# Patient Record
Sex: Male | Born: 1965
Health system: Southern US, Community
[De-identification: ages and names within clinical notes are randomized; demographics above are authoritative.]

## PROBLEM LIST (undated history)

## (undated) DIAGNOSIS — R161 Splenomegaly, not elsewhere classified: Secondary | ICD-10-CM

## (undated) DIAGNOSIS — K7460 Unspecified cirrhosis of liver: Secondary | ICD-10-CM

## (undated) DIAGNOSIS — R748 Abnormal levels of other serum enzymes: Secondary | ICD-10-CM

## (undated) DIAGNOSIS — D696 Thrombocytopenia, unspecified: Secondary | ICD-10-CM

## (undated) DIAGNOSIS — L219 Seborrheic dermatitis, unspecified: Secondary | ICD-10-CM

## (undated) DIAGNOSIS — B20 Human immunodeficiency virus [HIV] disease: Secondary | ICD-10-CM

## (undated) DIAGNOSIS — A630 Anogenital (venereal) warts: Secondary | ICD-10-CM

## (undated) DIAGNOSIS — Z21 Asymptomatic human immunodeficiency virus [HIV] infection status: Secondary | ICD-10-CM

## (undated) DIAGNOSIS — N529 Male erectile dysfunction, unspecified: Secondary | ICD-10-CM

## (undated) DIAGNOSIS — K219 Gastro-esophageal reflux disease without esophagitis: Secondary | ICD-10-CM

## (undated) DIAGNOSIS — B079 Viral wart, unspecified: Secondary | ICD-10-CM

---

## 1995-04-27 ENCOUNTER — Encounter (INDEPENDENT_AMBULATORY_CARE_PROVIDER_SITE_OTHER): Payer: Self-pay | Admitting: *Deleted

## 1995-04-27 LAB — CONVERTED CEMR LAB: CD4 T Cell Abs: 220

## 1997-07-12 ENCOUNTER — Other Ambulatory Visit: Admission: RE | Admit: 1997-07-12 | Discharge: 1997-07-12 | Payer: Self-pay | Admitting: Family Medicine

## 1997-08-23 ENCOUNTER — Ambulatory Visit (HOSPITAL_COMMUNITY): Admission: RE | Admit: 1997-08-23 | Discharge: 1997-08-23 | Payer: Self-pay | Admitting: Family Medicine

## 1997-10-10 ENCOUNTER — Encounter: Payer: Self-pay | Admitting: Internal Medicine

## 1997-11-26 ENCOUNTER — Encounter: Payer: Self-pay | Admitting: Internal Medicine

## 1998-02-04 ENCOUNTER — Encounter: Payer: Self-pay | Admitting: Internal Medicine

## 1998-02-11 ENCOUNTER — Encounter: Payer: Self-pay | Admitting: Internal Medicine

## 1998-02-11 ENCOUNTER — Encounter: Admission: RE | Admit: 1998-02-11 | Discharge: 1998-02-11 | Payer: Self-pay | Admitting: Internal Medicine

## 1998-03-12 ENCOUNTER — Encounter: Admission: RE | Admit: 1998-03-12 | Discharge: 1998-03-12 | Payer: Self-pay | Admitting: Internal Medicine

## 1998-04-10 ENCOUNTER — Ambulatory Visit (HOSPITAL_COMMUNITY): Admission: RE | Admit: 1998-04-10 | Discharge: 1998-04-10 | Payer: Self-pay | Admitting: Internal Medicine

## 1998-04-23 ENCOUNTER — Encounter: Admission: RE | Admit: 1998-04-23 | Discharge: 1998-04-23 | Payer: Self-pay | Admitting: Internal Medicine

## 1998-06-18 ENCOUNTER — Ambulatory Visit (HOSPITAL_COMMUNITY): Admission: RE | Admit: 1998-06-18 | Discharge: 1998-06-18 | Payer: Self-pay | Admitting: Infectious Diseases

## 1998-07-02 ENCOUNTER — Encounter: Admission: RE | Admit: 1998-07-02 | Discharge: 1998-07-02 | Payer: Self-pay | Admitting: Internal Medicine

## 1998-08-13 ENCOUNTER — Encounter: Admission: RE | Admit: 1998-08-13 | Discharge: 1998-08-13 | Payer: Self-pay | Admitting: Internal Medicine

## 1998-08-13 ENCOUNTER — Ambulatory Visit (HOSPITAL_COMMUNITY): Admission: RE | Admit: 1998-08-13 | Discharge: 1998-08-13 | Payer: Self-pay | Admitting: Internal Medicine

## 1998-09-03 ENCOUNTER — Encounter: Admission: RE | Admit: 1998-09-03 | Discharge: 1998-09-03 | Payer: Self-pay | Admitting: Internal Medicine

## 1998-09-03 ENCOUNTER — Encounter: Payer: Self-pay | Admitting: Internal Medicine

## 1998-09-23 ENCOUNTER — Encounter: Admission: RE | Admit: 1998-09-23 | Discharge: 1998-09-23 | Payer: Self-pay | Admitting: Internal Medicine

## 1998-10-29 ENCOUNTER — Ambulatory Visit (HOSPITAL_COMMUNITY): Admission: RE | Admit: 1998-10-29 | Discharge: 1998-10-29 | Payer: Self-pay | Admitting: Internal Medicine

## 1998-11-26 ENCOUNTER — Encounter: Admission: RE | Admit: 1998-11-26 | Discharge: 1998-11-26 | Payer: Self-pay | Admitting: Internal Medicine

## 1999-02-04 ENCOUNTER — Encounter: Admission: RE | Admit: 1999-02-04 | Discharge: 1999-02-04 | Payer: Self-pay | Admitting: Internal Medicine

## 1999-02-04 ENCOUNTER — Ambulatory Visit (HOSPITAL_COMMUNITY): Admission: RE | Admit: 1999-02-04 | Discharge: 1999-02-04 | Payer: Self-pay | Admitting: Internal Medicine

## 1999-03-10 ENCOUNTER — Encounter: Admission: RE | Admit: 1999-03-10 | Discharge: 1999-03-10 | Payer: Self-pay | Admitting: Internal Medicine

## 1999-04-30 ENCOUNTER — Encounter: Admission: RE | Admit: 1999-04-30 | Discharge: 1999-04-30 | Payer: Self-pay | Admitting: Internal Medicine

## 1999-04-30 ENCOUNTER — Ambulatory Visit (HOSPITAL_COMMUNITY): Admission: RE | Admit: 1999-04-30 | Discharge: 1999-04-30 | Payer: Self-pay | Admitting: Internal Medicine

## 1999-06-03 ENCOUNTER — Encounter: Admission: RE | Admit: 1999-06-03 | Discharge: 1999-06-03 | Payer: Self-pay | Admitting: Internal Medicine

## 1999-07-15 ENCOUNTER — Ambulatory Visit (HOSPITAL_COMMUNITY): Admission: RE | Admit: 1999-07-15 | Discharge: 1999-07-15 | Payer: Self-pay | Admitting: Internal Medicine

## 1999-07-15 ENCOUNTER — Encounter: Admission: RE | Admit: 1999-07-15 | Discharge: 1999-07-15 | Payer: Self-pay | Admitting: Internal Medicine

## 1999-07-29 ENCOUNTER — Encounter: Admission: RE | Admit: 1999-07-29 | Discharge: 1999-07-29 | Payer: Self-pay | Admitting: Internal Medicine

## 1999-10-07 ENCOUNTER — Encounter: Admission: RE | Admit: 1999-10-07 | Discharge: 1999-10-07 | Payer: Self-pay | Admitting: Internal Medicine

## 1999-10-07 ENCOUNTER — Ambulatory Visit (HOSPITAL_COMMUNITY): Admission: RE | Admit: 1999-10-07 | Discharge: 1999-10-07 | Payer: Self-pay | Admitting: Internal Medicine

## 1999-11-04 ENCOUNTER — Encounter: Admission: RE | Admit: 1999-11-04 | Discharge: 1999-11-04 | Payer: Self-pay | Admitting: Internal Medicine

## 2000-02-10 ENCOUNTER — Encounter: Admission: RE | Admit: 2000-02-10 | Discharge: 2000-02-10 | Payer: Self-pay | Admitting: Internal Medicine

## 2000-02-10 ENCOUNTER — Ambulatory Visit (HOSPITAL_COMMUNITY): Admission: RE | Admit: 2000-02-10 | Discharge: 2000-02-10 | Payer: Self-pay | Admitting: Internal Medicine

## 2000-02-24 ENCOUNTER — Encounter: Admission: RE | Admit: 2000-02-24 | Discharge: 2000-02-24 | Payer: Self-pay | Admitting: Internal Medicine

## 2000-04-01 ENCOUNTER — Encounter: Admission: RE | Admit: 2000-04-01 | Discharge: 2000-04-01 | Payer: Self-pay | Admitting: Internal Medicine

## 2000-07-23 ENCOUNTER — Encounter: Admission: RE | Admit: 2000-07-23 | Discharge: 2000-07-23 | Payer: Self-pay | Admitting: Internal Medicine

## 2000-07-23 ENCOUNTER — Ambulatory Visit (HOSPITAL_COMMUNITY): Admission: RE | Admit: 2000-07-23 | Discharge: 2000-07-23 | Payer: Self-pay | Admitting: Internal Medicine

## 2000-08-03 ENCOUNTER — Encounter: Admission: RE | Admit: 2000-08-03 | Discharge: 2000-08-03 | Payer: Self-pay | Admitting: Internal Medicine

## 2000-10-08 ENCOUNTER — Encounter: Admission: RE | Admit: 2000-10-08 | Discharge: 2000-10-08 | Payer: Self-pay | Admitting: Internal Medicine

## 2000-10-08 ENCOUNTER — Ambulatory Visit (HOSPITAL_COMMUNITY): Admission: RE | Admit: 2000-10-08 | Discharge: 2000-10-08 | Payer: Self-pay | Admitting: Internal Medicine

## 2000-11-23 ENCOUNTER — Encounter: Payer: Self-pay | Admitting: Internal Medicine

## 2000-11-23 ENCOUNTER — Encounter: Admission: RE | Admit: 2000-11-23 | Discharge: 2000-11-23 | Payer: Self-pay | Admitting: Internal Medicine

## 2001-03-01 ENCOUNTER — Encounter: Admission: RE | Admit: 2001-03-01 | Discharge: 2001-03-01 | Payer: Self-pay | Admitting: Internal Medicine

## 2001-04-20 ENCOUNTER — Ambulatory Visit (HOSPITAL_COMMUNITY): Admission: RE | Admit: 2001-04-20 | Discharge: 2001-04-20 | Payer: Self-pay | Admitting: Internal Medicine

## 2001-04-20 ENCOUNTER — Encounter: Admission: RE | Admit: 2001-04-20 | Discharge: 2001-04-20 | Payer: Self-pay | Admitting: Internal Medicine

## 2001-05-24 ENCOUNTER — Encounter: Admission: RE | Admit: 2001-05-24 | Discharge: 2001-05-24 | Payer: Self-pay | Admitting: Internal Medicine

## 2001-08-09 ENCOUNTER — Encounter: Admission: RE | Admit: 2001-08-09 | Discharge: 2001-08-09 | Payer: Self-pay | Admitting: Internal Medicine

## 2001-08-09 ENCOUNTER — Ambulatory Visit (HOSPITAL_COMMUNITY): Admission: RE | Admit: 2001-08-09 | Discharge: 2001-08-09 | Payer: Self-pay | Admitting: Internal Medicine

## 2001-08-23 ENCOUNTER — Encounter: Admission: RE | Admit: 2001-08-23 | Discharge: 2001-08-23 | Payer: Self-pay | Admitting: Internal Medicine

## 2001-11-03 ENCOUNTER — Ambulatory Visit (HOSPITAL_COMMUNITY): Admission: RE | Admit: 2001-11-03 | Discharge: 2001-11-03 | Payer: Self-pay | Admitting: Internal Medicine

## 2001-11-03 ENCOUNTER — Encounter: Admission: RE | Admit: 2001-11-03 | Discharge: 2001-11-03 | Payer: Self-pay | Admitting: Internal Medicine

## 2002-01-17 ENCOUNTER — Encounter: Admission: RE | Admit: 2002-01-17 | Discharge: 2002-01-17 | Payer: Self-pay | Admitting: Internal Medicine

## 2002-05-17 ENCOUNTER — Encounter: Payer: Self-pay | Admitting: Internal Medicine

## 2002-05-17 ENCOUNTER — Encounter: Admission: RE | Admit: 2002-05-17 | Discharge: 2002-05-17 | Payer: Self-pay | Admitting: Internal Medicine

## 2003-02-13 ENCOUNTER — Encounter: Payer: Self-pay | Admitting: Internal Medicine

## 2003-02-13 ENCOUNTER — Encounter: Admission: RE | Admit: 2003-02-13 | Discharge: 2003-02-13 | Payer: Self-pay | Admitting: Internal Medicine

## 2004-04-01 ENCOUNTER — Ambulatory Visit: Payer: Self-pay | Admitting: Internal Medicine

## 2004-04-01 ENCOUNTER — Ambulatory Visit (HOSPITAL_COMMUNITY): Admission: RE | Admit: 2004-04-01 | Discharge: 2004-04-01 | Payer: Self-pay | Admitting: Internal Medicine

## 2004-04-15 ENCOUNTER — Ambulatory Visit: Payer: Self-pay | Admitting: Internal Medicine

## 2004-05-28 ENCOUNTER — Ambulatory Visit: Payer: Self-pay | Admitting: Internal Medicine

## 2004-05-28 ENCOUNTER — Ambulatory Visit (HOSPITAL_COMMUNITY): Admission: RE | Admit: 2004-05-28 | Discharge: 2004-05-28 | Payer: Self-pay | Admitting: Internal Medicine

## 2004-10-14 ENCOUNTER — Ambulatory Visit (HOSPITAL_COMMUNITY): Admission: RE | Admit: 2004-10-14 | Discharge: 2004-10-14 | Payer: Self-pay | Admitting: Internal Medicine

## 2004-10-14 ENCOUNTER — Ambulatory Visit: Payer: Self-pay | Admitting: Internal Medicine

## 2004-12-29 ENCOUNTER — Ambulatory Visit (HOSPITAL_COMMUNITY): Admission: RE | Admit: 2004-12-29 | Discharge: 2004-12-29 | Payer: Self-pay | Admitting: Surgery

## 2005-01-16 ENCOUNTER — Ambulatory Visit (HOSPITAL_COMMUNITY): Admission: RE | Admit: 2005-01-16 | Discharge: 2005-01-16 | Payer: Self-pay | Admitting: Surgery

## 2005-01-26 HISTORY — PX: CONDYLOMA EXCISION/FULGURATION: SHX1389

## 2005-02-12 ENCOUNTER — Ambulatory Visit: Payer: Self-pay | Admitting: Internal Medicine

## 2005-03-03 ENCOUNTER — Ambulatory Visit: Payer: Self-pay | Admitting: Internal Medicine

## 2005-04-16 ENCOUNTER — Encounter: Admission: RE | Admit: 2005-04-16 | Discharge: 2005-04-16 | Payer: Self-pay | Admitting: Internal Medicine

## 2005-04-16 ENCOUNTER — Ambulatory Visit: Payer: Self-pay | Admitting: Internal Medicine

## 2005-05-05 ENCOUNTER — Ambulatory Visit: Payer: Self-pay | Admitting: Internal Medicine

## 2005-07-20 ENCOUNTER — Ambulatory Visit (HOSPITAL_BASED_OUTPATIENT_CLINIC_OR_DEPARTMENT_OTHER): Admission: RE | Admit: 2005-07-20 | Discharge: 2005-07-20 | Payer: Self-pay | Admitting: Urology

## 2005-08-18 ENCOUNTER — Ambulatory Visit: Payer: Self-pay | Admitting: Internal Medicine

## 2005-08-18 ENCOUNTER — Encounter: Admission: RE | Admit: 2005-08-18 | Discharge: 2005-08-18 | Payer: Self-pay | Admitting: Internal Medicine

## 2005-08-18 ENCOUNTER — Encounter (INDEPENDENT_AMBULATORY_CARE_PROVIDER_SITE_OTHER): Payer: Self-pay | Admitting: *Deleted

## 2005-09-01 ENCOUNTER — Ambulatory Visit: Payer: Self-pay | Admitting: Internal Medicine

## 2005-12-08 ENCOUNTER — Encounter: Admission: RE | Admit: 2005-12-08 | Discharge: 2005-12-08 | Payer: Self-pay | Admitting: Internal Medicine

## 2005-12-08 ENCOUNTER — Encounter (INDEPENDENT_AMBULATORY_CARE_PROVIDER_SITE_OTHER): Payer: Self-pay | Admitting: *Deleted

## 2005-12-08 ENCOUNTER — Ambulatory Visit: Payer: Self-pay | Admitting: Internal Medicine

## 2006-02-06 DIAGNOSIS — D696 Thrombocytopenia, unspecified: Secondary | ICD-10-CM

## 2006-02-06 DIAGNOSIS — B078 Other viral warts: Secondary | ICD-10-CM

## 2006-02-06 DIAGNOSIS — F528 Other sexual dysfunction not due to a substance or known physiological condition: Secondary | ICD-10-CM | POA: Insufficient documentation

## 2006-02-06 DIAGNOSIS — B20 Human immunodeficiency virus [HIV] disease: Secondary | ICD-10-CM | POA: Insufficient documentation

## 2006-02-06 DIAGNOSIS — K219 Gastro-esophageal reflux disease without esophagitis: Secondary | ICD-10-CM | POA: Insufficient documentation

## 2006-02-22 ENCOUNTER — Encounter (INDEPENDENT_AMBULATORY_CARE_PROVIDER_SITE_OTHER): Payer: Self-pay | Admitting: *Deleted

## 2006-02-22 ENCOUNTER — Encounter: Admission: RE | Admit: 2006-02-22 | Discharge: 2006-02-22 | Payer: Self-pay | Admitting: Internal Medicine

## 2006-02-22 ENCOUNTER — Ambulatory Visit: Payer: Self-pay | Admitting: Internal Medicine

## 2006-02-22 LAB — CONVERTED CEMR LAB
CD4 Count: 160 microliters
HIV 1 RNA Quant: 49 copies/mL

## 2006-03-22 ENCOUNTER — Encounter (INDEPENDENT_AMBULATORY_CARE_PROVIDER_SITE_OTHER): Payer: Self-pay | Admitting: *Deleted

## 2006-03-22 LAB — CONVERTED CEMR LAB

## 2006-03-23 ENCOUNTER — Telehealth (INDEPENDENT_AMBULATORY_CARE_PROVIDER_SITE_OTHER): Payer: Self-pay | Admitting: *Deleted

## 2006-04-04 ENCOUNTER — Encounter (INDEPENDENT_AMBULATORY_CARE_PROVIDER_SITE_OTHER): Payer: Self-pay | Admitting: *Deleted

## 2006-04-06 ENCOUNTER — Encounter (INDEPENDENT_AMBULATORY_CARE_PROVIDER_SITE_OTHER): Payer: Self-pay | Admitting: *Deleted

## 2006-04-27 ENCOUNTER — Telehealth: Payer: Self-pay | Admitting: Internal Medicine

## 2006-05-24 ENCOUNTER — Telehealth: Payer: Self-pay | Admitting: Internal Medicine

## 2006-07-14 ENCOUNTER — Encounter: Payer: Self-pay | Admitting: Infectious Diseases

## 2006-07-14 ENCOUNTER — Ambulatory Visit: Payer: Self-pay | Admitting: Infectious Diseases

## 2006-07-14 ENCOUNTER — Encounter: Admission: RE | Admit: 2006-07-14 | Discharge: 2006-07-14 | Payer: Self-pay | Admitting: Infectious Diseases

## 2006-07-14 LAB — CONVERTED CEMR LAB: CD4 Count: 190 microliters

## 2006-08-02 ENCOUNTER — Ambulatory Visit: Payer: Self-pay | Admitting: Infectious Diseases

## 2006-09-10 ENCOUNTER — Telehealth: Payer: Self-pay | Admitting: Internal Medicine

## 2006-10-18 ENCOUNTER — Telehealth: Payer: Self-pay | Admitting: Internal Medicine

## 2006-12-16 ENCOUNTER — Telehealth: Payer: Self-pay | Admitting: Internal Medicine

## 2007-01-25 ENCOUNTER — Encounter (INDEPENDENT_AMBULATORY_CARE_PROVIDER_SITE_OTHER): Payer: Self-pay | Admitting: *Deleted

## 2007-03-14 ENCOUNTER — Encounter: Payer: Self-pay | Admitting: Internal Medicine

## 2007-03-17 ENCOUNTER — Telehealth: Payer: Self-pay | Admitting: Infectious Diseases

## 2007-03-25 ENCOUNTER — Encounter: Admission: RE | Admit: 2007-03-25 | Discharge: 2007-03-25 | Payer: Self-pay | Admitting: Internal Medicine

## 2007-03-25 ENCOUNTER — Ambulatory Visit: Payer: Self-pay | Admitting: Internal Medicine

## 2007-03-25 LAB — CONVERTED CEMR LAB
ALT: 25 units/L (ref 0–53)
Albumin: 3.9 g/dL (ref 3.5–5.2)
Alkaline Phosphatase: 65 units/L (ref 39–117)
CO2: 19 meq/L (ref 19–32)
Glucose, Bld: 85 mg/dL (ref 70–99)
HIV 1 RNA Quant: <50 copies/mL (ref <50)
HIV-1 RNA Quant, Log: <1.7 (ref <1.70)
MCV: 81.8 fL (ref 78.0–100.0)
Potassium: 4 meq/L (ref 3.5–5.3)
RBC: 4.13 M/uL — ABNORMAL LOW (ref 4.22–5.81)
Sodium: 139 meq/L (ref 135–145)
Total Bilirubin: 1.4 mg/dL — ABNORMAL HIGH (ref 0.3–1.2)
Total Protein: 6.8 g/dL (ref 6.0–8.3)
WBC: 2.3 10*3/uL — ABNORMAL LOW (ref 4.0–10.5)

## 2007-04-04 ENCOUNTER — Encounter (INDEPENDENT_AMBULATORY_CARE_PROVIDER_SITE_OTHER): Payer: Self-pay | Admitting: *Deleted

## 2007-04-20 ENCOUNTER — Ambulatory Visit: Payer: Self-pay | Admitting: Internal Medicine

## 2007-05-10 ENCOUNTER — Encounter (INDEPENDENT_AMBULATORY_CARE_PROVIDER_SITE_OTHER): Payer: Self-pay | Admitting: *Deleted

## 2007-10-04 ENCOUNTER — Ambulatory Visit: Payer: Self-pay | Admitting: Internal Medicine

## 2007-10-04 LAB — CONVERTED CEMR LAB
HDL: 53 mg/dL (ref >39)
HIV 1 RNA Quant: 187 copies/mL — ABNORMAL HIGH (ref <50)
LDL Cholesterol: 75 mg/dL (ref 0–99)
Total CHOL/HDL Ratio: 2.8

## 2007-10-18 ENCOUNTER — Ambulatory Visit: Payer: Self-pay | Admitting: Internal Medicine

## 2008-04-02 ENCOUNTER — Ambulatory Visit: Payer: Self-pay | Admitting: Internal Medicine

## 2008-04-02 LAB — CONVERTED CEMR LAB
AST: 43 units/L — ABNORMAL HIGH (ref 0–37)
Alkaline Phosphatase: 47 units/L (ref 39–117)
BUN: 10 mg/dL (ref 6–23)
Band Neutrophils: 0 % (ref 0–10)
Basophils Absolute: 0 10*3/uL (ref 0.0–0.1)
Basophils Relative: 0 % (ref 0–1)
Calcium: 8.3 mg/dL — ABNORMAL LOW (ref 8.4–10.5)
Creatinine, Ser: 0.84 mg/dL (ref 0.40–1.50)
Eosinophils Absolute: 0 10*3/uL (ref 0.0–0.7)
Eosinophils Relative: 1 % (ref 0–5)
HDL: 54 mg/dL (ref >39)
LDL Cholesterol: 64 mg/dL (ref 0–99)
MCHC: 35.8 g/dL (ref 30.0–36.0)
MCV: 85.9 fL (ref 78.0–100.0)
Neutrophils Relative %: 71 % (ref 43–77)
Platelets: 27 10*3/uL — CL (ref 150–400)
Total Bilirubin: 1.2 mg/dL (ref 0.3–1.2)
Total CHOL/HDL Ratio: 2.5
VLDL: 15 mg/dL (ref 0–40)

## 2008-04-17 ENCOUNTER — Ambulatory Visit: Payer: Self-pay | Admitting: Internal Medicine

## 2008-04-17 DIAGNOSIS — L219 Seborrheic dermatitis, unspecified: Secondary | ICD-10-CM | POA: Insufficient documentation

## 2008-05-23 ENCOUNTER — Encounter (INDEPENDENT_AMBULATORY_CARE_PROVIDER_SITE_OTHER): Payer: Self-pay | Admitting: *Deleted

## 2008-09-12 ENCOUNTER — Ambulatory Visit: Payer: Self-pay | Admitting: Internal Medicine

## 2008-09-12 LAB — CONVERTED CEMR LAB
Basophils Relative: 0 % (ref 0–1)
Eosinophils Absolute: 0.1 10*3/uL (ref 0.0–0.7)
MCHC: 34.2 g/dL (ref 30.0–36.0)
MCV: 88.4 fL (ref >=78.0)
Monocytes Absolute: 0.3 10*3/uL (ref 0.1–1.0)
Monocytes Relative: 10 % (ref 3–12)
Neutrophils Relative %: 61 % (ref 43–77)
RBC: 4.14 M/uL — ABNORMAL LOW (ref 4.22–5.81)
RDW: 16.9 % — ABNORMAL HIGH (ref 11.5–15.5)

## 2008-09-13 ENCOUNTER — Telehealth (INDEPENDENT_AMBULATORY_CARE_PROVIDER_SITE_OTHER): Payer: Self-pay | Admitting: Internal Medicine

## 2008-09-25 ENCOUNTER — Ambulatory Visit: Payer: Self-pay | Admitting: Internal Medicine

## 2009-04-02 ENCOUNTER — Encounter (INDEPENDENT_AMBULATORY_CARE_PROVIDER_SITE_OTHER): Payer: Self-pay | Admitting: *Deleted

## 2009-04-03 ENCOUNTER — Ambulatory Visit: Payer: Self-pay | Admitting: Internal Medicine

## 2009-04-03 LAB — CONVERTED CEMR LAB
Basophils Relative: 0 % (ref 0–1)
CO2: 21 meq/L (ref 19–32)
Cholesterol: 146 mg/dL (ref 0–200)
Creatinine, Ser: 1.03 mg/dL (ref 0.40–1.50)
Eosinophils Absolute: 0.1 10*3/uL (ref 0.0–0.7)
Glucose, Bld: 92 mg/dL (ref 70–99)
HIV-1 RNA Quant, Log: 2.04 — ABNORMAL HIGH (ref <1.68)
Hemoglobin: 13.2 g/dL (ref 13.0–17.0)
MCHC: 35.9 g/dL (ref 30.0–36.0)
MCV: 88.2 fL (ref >=78.0)
Monocytes Absolute: 0.2 10*3/uL (ref 0.1–1.0)
Monocytes Relative: 9 % (ref 3–12)
RBC: 4.17 M/uL — ABNORMAL LOW (ref 4.22–5.81)
Total Bilirubin: 1.6 mg/dL — ABNORMAL HIGH (ref 0.3–1.2)
Total CHOL/HDL Ratio: 2.9
Triglycerides: 67 mg/dL (ref <150)
VLDL: 13 mg/dL (ref 0–40)

## 2009-04-04 ENCOUNTER — Telehealth: Payer: Self-pay | Admitting: Internal Medicine

## 2009-04-30 ENCOUNTER — Ambulatory Visit: Payer: Self-pay | Admitting: Internal Medicine

## 2009-10-08 ENCOUNTER — Encounter: Payer: Self-pay | Admitting: Internal Medicine

## 2009-10-08 ENCOUNTER — Telehealth (INDEPENDENT_AMBULATORY_CARE_PROVIDER_SITE_OTHER): Payer: Self-pay | Admitting: *Deleted

## 2009-10-29 ENCOUNTER — Ambulatory Visit: Payer: Self-pay | Admitting: Internal Medicine

## 2009-11-21 ENCOUNTER — Ambulatory Visit: Payer: Self-pay | Admitting: Internal Medicine

## 2009-12-17 ENCOUNTER — Encounter (INDEPENDENT_AMBULATORY_CARE_PROVIDER_SITE_OTHER): Payer: Self-pay | Admitting: *Deleted

## 2010-02-23 LAB — CONVERTED CEMR LAB
Albumin: 4.1 g/dL (ref 3.5–5.2)
Alkaline Phosphatase: 71 units/L (ref 39–117)
BUN: 7 mg/dL (ref 6–23)
CO2: 22 meq/L (ref 19–32)
Calcium: 8.7 mg/dL (ref 8.4–10.5)
Cholesterol: 150 mg/dL (ref 0–200)
Glucose, Bld: 102 mg/dL — ABNORMAL HIGH (ref 70–99)
HDL: 37 mg/dL — ABNORMAL LOW (ref >39)
HIV 1 RNA Quant: <50 copies/mL (ref <50)
HIV 1 RNA Quant: <50 copies/mL (ref <50)
HIV-1 RNA Quant, Log: <1.7 (ref <1.70)
HIV-1 RNA Quant, Log: <1.7 (ref <1.70)
Hemoglobin: 12.3 g/dL — ABNORMAL LOW (ref 13.0–17.0)
LDL Cholesterol: 89 mg/dL (ref 0–99)
MCHC: 35.7 g/dL (ref 30.0–36.0)
Potassium: 3.4 meq/L — ABNORMAL LOW (ref 3.5–5.3)
RBC: 4.01 M/uL — ABNORMAL LOW (ref 4.22–5.81)
Total Protein: 6.9 g/dL (ref 6.0–8.3)
Triglycerides: 120 mg/dL (ref <150)
WBC: 2.6 10*3/uL — ABNORMAL LOW (ref 4.0–10.5)

## 2010-02-25 NOTE — Assessment & Plan Note (Signed)
Summary: F/U APPT/VS   CC:  follow-up visit.  History of Present Illness: Robert Ware is in for his routine visit.  He recalls missing only two doses of his HIV medications since his last visit and both of those occurred when his pharmacy was late to refilling his prescriptions.  He is doing well and denies any recent problems.  Preventive Screening-Counseling & Management  Alcohol-Tobacco     Alcohol drinks/day: 0     Alcohol type: all     Smoking Status: quit     Year Quit: 2007     Pack years: 1/5 ppd  Caffeine-Diet-Exercise     Caffeine use/day: yes     Does Patient Exercise: yes     Type of exercise: work out     Exercise (avg: min/session): >60     Times/week: 3  Hep-HIV-STD-Contraception     HIV Risk: no risk noted     HIV Risk Counseling: to avoid increased HIV risk  Safety-Violence-Falls     Seat Belt Use: yes  Comments: declined condoms      Sexual History:  currently monogamous.        Drug Use:  never.     Prior Medication List:  TRIZIVIR 300-150-300 MG TABS (ABACAVIR-LAMIVUDINE-ZIDOVUDINE) Take 1 tablet by mouth two times a day VIREAD 300 MG TABS (TENOFOVIR DISOPROXIL FUMARATE) Take 1 tablet by mouth once a day PREZISTA 600 MG  TABS (DARUNAVIR) Take 1 tablet by mouth two times a day NORVIR 100 MG CAPS (RITONAVIR) Take 1 capsule by mouth two times a day DAPSONE 100 MG TABS (DAPSONE) Take 1 tablet by mouth once a day PRILOSEC 40 MG  CPDR (OMEPRAZOLE) Take 1 tablet by mouth once a day KETOCONAZOLE 2 % CREA (KETOCONAZOLE) Apply to rash two times a day   Current Allergies (reviewed today): No known allergies  Social History: Sexual History:  currently monogamous  Vital Signs:  Patient profile:   45 year old male Height:      77 inches (195.58 cm) Weight:      217 pounds (98.64 kg) BMI:     25.83 Temp:     98.8 degrees F (37.11 degrees C) oral Pulse rate:   83 / minute BP sitting:   131 / 85  (left arm) Cuff size:   large  Vitals Entered By: Jennet Maduro RN (April 30, 2009 3:07 PM) CC: follow-up visit Is Patient Diabetic? No Pain Assessment Patient in pain? no      Nutritional Status BMI of 25 - 29 = overweight Nutritional Status Detail appetite "OK"  Have you ever been in a relationship where you felt threatened, hurt or afraid?No   Does patient need assistance? Functional Status Self care Ambulation Normal Comments no missed doses of rxes   Physical Exam  General:  alert and well-nourished.   Mouth:  pharynx pink and moist.  good dentition.   Lungs:  normal breath sounds.  no crackles and no wheezes.   Heart:  normal rate, regular rhythm, and no murmur.   Psych:  normally interactive, good eye contact, not anxious appearing, and not depressed appearing.      Impression & Recommendations:  Problem # 1:  HIV DISEASE (ICD-042) His last two CD4 counts have been over 200 but since he has frequently gone above 200 then back down below I will continue his PCP prophylaxis for now. 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 Patient Level III (10272) Future Orders: T-CD4SP ([facility]) (CD4SP) ... 10/27/2009 T-HIV Viral Load [phone]) ... 10/27/2009  Patient Instructions: 1)  Please schedule a follow-up appointment in 6 months.

## 2010-02-25 NOTE — Medication Information (Signed)
Summary: [facility]  [facility]   Imported By: Florinda Marker 10/14/2009 14:43:07  _____________________________________________________________________  External Attachment:    Type:   Image     Comment:   External Document

## 2010-02-25 NOTE — Miscellaneous (Signed)
Summary: clinical update/ryan white [facility] appr til 04/26/10  Clinical Lists Changes  Observations: Added new observation of AIDSDAP: Yes 2011 (04/02/2009 12:21)

## 2010-02-25 NOTE — Progress Notes (Signed)
Summary: [facility] uanble to reach pt/can't ship meds  Phone Note Outgoing Call   Call placed by: Paulo Fruit  BS,CPht II,MPH,  October 08, 2009 11:18 AM Call placed to: Patient Details for Reason: [facility] unable to reach pt to delivery meds Summary of Call: received an email from [facility] support [facility] stating that they have been unsuccessful in reaching patient to set up his refill.    I contacted patient but had to leave a message for patient to contact Byrd Hesselbach at [phone] concerning this matter. Initial call taken by: Paulo Fruit  BS,CPht II,MPH,  October 08, 2009 11:19 AM     Appended Document: [facility] uanble to reach pt/can't ship meds Patient returned Robert Ware's phone call in reference to [facility] not being able to reach him to set up his refill/delivery.  He said he will give them a call today.

## 2010-02-25 NOTE — Progress Notes (Signed)
  Phone Note Other Incoming   Summary of Call: Received page from [facility] about the critical lab value of platelet 26K. 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 Blair Dolphin note and current  platelent count seems to be his baseline recently and is stable. Will not make any changes to the current management. Will send a  flag to Dr. Orvan Falconer.

## 2010-02-25 NOTE — Assessment & Plan Note (Signed)
Summary: fukam   CC:  follow-up visit.  History of Present Illness: Robert Ware is in for his regular appointment.  It sounds like he had some difficulty in getting his medications recently. He says that he was out of the medicines twice but it is unclear to me for exactly how long.  He is not taking his Prilosec now because he is not having any acid indigestion.  He recently started working out several times each week at the gym and has been happy to lose some weight.  Preventive Screening-Counseling & Management  Alcohol-Tobacco     Alcohol drinks/day: 0     Alcohol type: all     Smoking Status: quit     Year Quit: 2007     Pack years: 1/5 ppd  Caffeine-Diet-Exercise     Caffeine use/day: yes     Does Patient Exercise: yes     Type of exercise: work out     Exercise (avg: min/session): >60     Times/week: 3  Hep-HIV-STD-Contraception     HIV Risk: no risk noted     HIV Risk Counseling: to avoid increased HIV risk  Safety-Violence-Falls     Seat Belt Use: yes  Comments: declined condoms      Sexual History:  n/a.        Drug Use:  never.     Updated Prior Medication List: TRIZIVIR 300-150-300 MG TABS (ABACAVIR-LAMIVUDINE-ZIDOVUDINE) Take 1 tablet by mouth two times a day VIREAD 300 MG TABS (TENOFOVIR DISOPROXIL FUMARATE) Take 1 tablet by mouth once a day PREZISTA 600 MG  TABS (DARUNAVIR) Take 1 tablet by mouth two times a day NORVIR 100 MG CAPS (RITONAVIR) Take 1 capsule by mouth two times a day DAPSONE 100 MG TABS (DAPSONE) Take 1 tablet by mouth once a day KETOCONAZOLE 2 % CREA (KETOCONAZOLE) Apply to rash two times a day  Current Allergies (reviewed today): No known allergies  Social History: Sexual History:  n/a  Vital Signs:  Patient profile:   45 year old male Height:      77 inches (195.58 cm) Weight:      206.75 pounds (93.98 kg) BMI:     24.61 Temp:     98.2 degrees F (36.78 degrees C) oral Pulse rate:   68 / minute BP sitting:   121 / 78  (left  arm) Cuff size:   large  Vitals Entered By: Jennet Maduro RN (November 21, 2009 10:57 AM) CC: follow-up visit Is Patient Diabetic? No Pain Assessment Patient in pain? no      Nutritional Status BMI of 25 - 29 = overweight Nutritional Status Detail appetite "so-so"  Have you ever been in a relationship where you felt threatened, hurt or afraid?No   Does patient need assistance? Functional Status Self care Ambulation Normal Comments "still taking meds."   Physical Exam  General:  alert and well-nourished.  he has lost 11 pounds. Mouth:  pharynx pink and moist.  good dentition.   Lungs:  normal breath sounds.  no crackles and no wheezes.   Heart:  normal rate, regular rhythm, and no murmur.          Medication Adherence: 11/21/2009   Adherence to medications reviewed with patient. Counseling to provide adequate adherence provided                                Impression & Recommendations:  Problem # 1:  HIV DISEASE (  ICD-042) Fortunately, his HIV infection remains under very good control.  He can stop his dapsone now.  I will continue his current antiretroviral regimen.  I have encouraged him to try not to miss a single dose. 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 The following medications were removed from the medication list:    Prilosec 40 Mg Cpdr (Omeprazole) .Marland Kitchen... Take 1 tablet by mouth once a day  Orders: Est. Patient Level III (62952)  Other Orders: Future Orders: T-CD4SP ([facility]) (CD4SP) ... 05/20/2010 T-HIV Viral Load [phone]) ... 05/20/2010 T-Comprehensive Metabolic Panel [id number]) ... 05/20/2010 T-CBC  w/Diff ([id number]) ... 05/20/2010 T-RPR (Syphilis) [phone]) ... 05/20/2010 T-Lipid Profile [id number]) ... 05/20/2010  Patient Instructions: 1)  Please schedule a follow-up appointment in 6 months.  Appended Document: f/u   Influenza Vaccine    Vaccine Type: Fluvax Non-MCR    Site: left deltoid    Mfr: novartis    Dose: 0.5 ml    Route: IM    Given by: Jennet Maduro RN    Exp. Date: 04/27/2010    Lot #: [id number]  Flu Vaccine Consent Questions    Do you have a history of severe allergic reactions to this vaccine? no    Any prior history of allergic reactions to egg and/or gelatin? no    Do you have a sensitivity to the preservative Thimersol? no    Do you have a past history of Guillan-Barre Syndrome? no    Do you currently have an acute febrile illness? no    Have you ever had a severe reaction to latex? no    Vaccine information given and explained to patient? yes

## 2010-02-25 NOTE — Miscellaneous (Signed)
  Clinical Lists Changes  Observations: Added new observation of YEARAIDSPOS: 2006  (12/17/2009 15:38)

## 2010-04-10 LAB — T-HELPER CELL (CD4) - (RCID CLINIC ONLY)
CD4 % Helper T Cell: 31 % — ABNORMAL LOW (ref 33–55)
CD4 T Cell Abs: 240 uL — ABNORMAL LOW (ref 400–2700)

## 2010-04-21 LAB — T-HELPER CELL (CD4) - (RCID CLINIC ONLY): CD4 % Helper T Cell: 33 % (ref 33–55)

## 2010-05-03 LAB — T-HELPER CELL (CD4) - (RCID CLINIC ONLY): CD4 T Cell Abs: 210 uL — ABNORMAL LOW (ref 400–2700)

## 2010-05-10 ENCOUNTER — Other Ambulatory Visit: Payer: Self-pay | Admitting: Internal Medicine

## 2010-05-10 DIAGNOSIS — B49 Unspecified mycosis: Secondary | ICD-10-CM

## 2010-05-12 ENCOUNTER — Other Ambulatory Visit: Payer: Self-pay | Admitting: Licensed Clinical Social Worker

## 2010-05-12 DIAGNOSIS — B49 Unspecified mycosis: Secondary | ICD-10-CM

## 2010-06-13 NOTE — Op Note (Signed)
Robert Ware, Robert Ware            ACCOUNT NO.:  [id number]   MEDICAL RECORD NO.:  [id number]          PATIENT TYPE:  AMB   LOCATION:  [facility]                         FACILITY:  [facility]   PHYSICIAN:  Bertram Millard. Dahlstedt, M.D.DATE OF BIRTH:  Jan 14, 1966   DATE OF PROCEDURE:  07/20/2005  DATE OF DISCHARGE:                                 OPERATIVE REPORT   PREOPERATIVE DIAGNOSIS:  Multiple genital condyloma.   POSTOPERATIVE DIAGNOSIS:  Multiple genital condyloma.   PROCEDURE:  CO2 laser of genital condyloma.   SURGEON:  Dr. Retta Diones.   ANESTHESIA:  General.   COMPLICATIONS:  None.   ESTIMATED BLOOD LOSS:  Minimal.   SPECIMENS:  None.   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  He has requested management of these.  Due to the size, I could not manage them in the office.  I recommended we  put him to sleeping use laser to treat these.  There are two various  approximately 4 cm x 2 cm on each side of the scrotum.  He is aware of risks  and complications and desires to proceed.   DESCRIPTION OF PROCEDURE:  The patient was administered preoperative IV  antibiotics and taken to the operating room where general anesthetic was  administered.  Genitalia were prepped with Hibiclens.  Moist towels were  used to block the surgical site.  The CO2 laser was used to ablate all the  condyloma.  Some of these were quite large, approximately 1 cm thick.  Some  of them were actually excised using the laser.  Others were just ablated  superficially.  Debridement was used to get down to the base of all of  these, and the base was then cauterized as well.  Altogether, treatment  lasted approximately 40 minutes.  Following this, Neosporin was placed on  the wounds.  He was awakened and taken to the PACU in stable condition.   He was discharged on Vicodin #20  and Neosporin to be used twice daily.  He  will follow-up in approximately 2 weeks.     Bertram Millard. Dahlstedt, M.D.  Electronically Signed    SMD/MEDQ  D:  07/20/2005  T:  07/20/2005  Job:  [id number]

## 2010-06-16 ENCOUNTER — Other Ambulatory Visit (INDEPENDENT_AMBULATORY_CARE_PROVIDER_SITE_OTHER): Payer: Self-pay

## 2010-06-16 DIAGNOSIS — B20 Human immunodeficiency virus [HIV] disease: Secondary | ICD-10-CM

## 2010-06-17 ENCOUNTER — Telehealth: Payer: Self-pay | Admitting: Internal Medicine

## 2010-06-17 LAB — CBC WITH DIFFERENTIAL/PLATELET
Lymphocytes Relative: 28 % (ref 12–46)
Lymphs Abs: 0.7 10*3/uL (ref 0.7–4.0)
MCV: 85.8 fL (ref 78.0–100.0)
Neutrophils Relative %: 61 % (ref 43–77)
Platelets: 24 10*3/uL — CL (ref 150–400)
RBC: 4.79 MIL/uL (ref 4.22–5.81)
WBC: 2.5 10*3/uL — ABNORMAL LOW (ref 4.0–10.5)

## 2010-06-17 LAB — RPR

## 2010-06-17 LAB — HIV-1 RNA QUANT-NO REFLEX-BLD: HIV 1 RNA Quant: <20 copies/mL (ref <20)

## 2010-06-17 LAB — BASIC METABOLIC PANEL WITH GFR
CO2: 19 mEq/L (ref 19–32)
Calcium: 8.8 mg/dL (ref 8.4–10.5)
GFR, Est African American: >60 mL/min (ref >60)
Potassium: 4 mEq/L (ref 3.5–5.3)
Sodium: 139 mEq/L (ref 135–145)

## 2010-06-17 LAB — T-HELPER CELL (CD4) - (RCID CLINIC ONLY): CD4 % Helper T Cell: 33 % (ref 33–55)

## 2010-06-17 LAB — LIPID PANEL: Cholesterol: 157 mg/dL (ref 0–200)

## 2010-06-17 NOTE — Telephone Encounter (Signed)
Called for a critical lab value at 5 AM of platelets of 24,000 which was confirmed by smear review. Looking back at the results, patients has had chronic thrombocytopenia and this is not something which is new for him. Would route the note to PCP.

## 2010-07-08 ENCOUNTER — Encounter: Payer: Self-pay | Admitting: *Deleted

## 2010-07-08 ENCOUNTER — Encounter: Payer: Self-pay | Admitting: Internal Medicine

## 2010-07-08 ENCOUNTER — Ambulatory Visit (INDEPENDENT_AMBULATORY_CARE_PROVIDER_SITE_OTHER): Payer: Self-pay | Admitting: Internal Medicine

## 2010-07-08 VITALS — BP 128/81 | HR 75 | Temp 98.0°F | Ht 74.0 in | Wt 216.0 lb

## 2010-07-08 DIAGNOSIS — B20 Human immunodeficiency virus [HIV] disease: Secondary | ICD-10-CM

## 2010-07-08 DIAGNOSIS — Z23 Encounter for immunization: Secondary | ICD-10-CM

## 2010-07-08 DIAGNOSIS — Z21 Asymptomatic human immunodeficiency virus [HIV] infection status: Secondary | ICD-10-CM

## 2010-07-08 NOTE — Assessment & Plan Note (Signed)
His HIV viral load remains undetectable and his CD4 count remains stable in the low 200 range. He has chronic thrombocytopenia related to his HIV infection that is asymptomatic. I will continue his current regimen.

## 2010-07-08 NOTE — Progress Notes (Signed)
  Subjective:    Patient ID: Robert Ware, male    DOB: 11/06/1965, 45 y.o.   MRN: [id number]  HPI Robert Ware his in for his routine visit. He recalls missing 2 days worth of his medications when [employer] was late delivering them. Both times the pharmacy had called him on time and he believes the problem was with [employer] and not the pharmacy. He knows to stop taking all of his HIV medicines if he is out of even one. He denies any problems or concerns since his last visit.    Review of Systems     Objective:   Physical Exam  Constitutional: He appears well-developed and well-nourished. No distress.  HENT:  Mouth/Throat: Oropharynx is clear and moist. No oropharyngeal exudate.  Cardiovascular: Normal rate, regular rhythm and normal heart sounds.   No murmur heard. Pulmonary/Chest: Breath sounds normal. He has no wheezes. He has no rales.  Psychiatric: He has a normal mood and affect.          Assessment & Plan:

## 2010-08-05 ENCOUNTER — Other Ambulatory Visit: Payer: Self-pay | Admitting: *Deleted

## 2010-08-05 DIAGNOSIS — B49 Unspecified mycosis: Secondary | ICD-10-CM

## 2010-08-05 MED ORDER — KETOCONAZOLE 2 % EX CREA: TOPICAL_CREAM | Freq: Two times a day (BID) | CUTANEOUS | Status: DC

## 2010-08-06 ENCOUNTER — Ambulatory Visit: Payer: Self-pay

## 2010-08-08 ENCOUNTER — Ambulatory Visit: Payer: Self-pay

## 2010-10-20 LAB — T-HELPER CELL (CD4) - (RCID CLINIC ONLY): CD4 T Cell Abs: 210 — ABNORMAL LOW

## 2010-10-29 LAB — T-HELPER CELL (CD4) - (RCID CLINIC ONLY): CD4 % Helper T Cell: 29 — ABNORMAL LOW

## 2010-11-12 LAB — T-HELPER CELL (CD4) - (RCID CLINIC ONLY): CD4 T Cell Abs: 190 — ABNORMAL LOW

## 2010-11-17 ENCOUNTER — Other Ambulatory Visit: Payer: Self-pay | Admitting: *Deleted

## 2010-11-17 DIAGNOSIS — B49 Unspecified mycosis: Secondary | ICD-10-CM

## 2010-11-17 DIAGNOSIS — B20 Human immunodeficiency virus [HIV] disease: Secondary | ICD-10-CM

## 2010-11-17 MED ORDER — TENOFOVIR DISOPROXIL FUMARATE 300 MG PO TABS: 300.0000 mg | ORAL_TABLET | Freq: Every day | ORAL | Status: DC

## 2010-11-17 MED ORDER — RITONAVIR 100 MG PO TABS: 100.0000 mg | ORAL_TABLET | ORAL | Status: DC

## 2010-11-17 MED ORDER — KETOCONAZOLE 2 % EX CREA: TOPICAL_CREAM | Freq: Two times a day (BID) | CUTANEOUS | Status: DC

## 2010-11-17 MED ORDER — DARUNAVIR ETHANOLATE 400 MG PO TABS: 400.0000 mg | ORAL_TABLET | Freq: Two times a day (BID) | ORAL | Status: DC

## 2010-11-17 MED ORDER — ABACAVIR-LAMIVUDINE-ZIDOVUDINE 300-150-300 MG PO TABS: 1.0000 | ORAL_TABLET | Freq: Two times a day (BID) | ORAL | Status: DC

## 2010-11-26 ENCOUNTER — Ambulatory Visit (INDEPENDENT_AMBULATORY_CARE_PROVIDER_SITE_OTHER): Payer: Self-pay | Admitting: *Deleted

## 2010-11-26 DIAGNOSIS — B20 Human immunodeficiency virus [HIV] disease: Secondary | ICD-10-CM

## 2010-11-26 DIAGNOSIS — Z23 Encounter for immunization: Secondary | ICD-10-CM

## 2010-12-23 ENCOUNTER — Other Ambulatory Visit: Payer: Self-pay | Admitting: Internal Medicine

## 2010-12-23 ENCOUNTER — Other Ambulatory Visit: Payer: Self-pay

## 2010-12-23 ENCOUNTER — Other Ambulatory Visit (INDEPENDENT_AMBULATORY_CARE_PROVIDER_SITE_OTHER): Payer: Self-pay

## 2010-12-23 DIAGNOSIS — B20 Human immunodeficiency virus [HIV] disease: Secondary | ICD-10-CM

## 2010-12-24 LAB — T-HELPER CELL (CD4) - (RCID CLINIC ONLY)
CD4 % Helper T Cell: 30 % — ABNORMAL LOW (ref 33–55)
CD4 T Cell Abs: 270 uL — ABNORMAL LOW (ref 400–2700)

## 2010-12-25 LAB — HIV-1 RNA QUANT-NO REFLEX-BLD: HIV-1 RNA Quant, Log: 1.4 {Log} — ABNORMAL HIGH (ref <1.30)

## 2011-01-06 ENCOUNTER — Encounter: Payer: Self-pay | Admitting: Internal Medicine

## 2011-01-06 ENCOUNTER — Ambulatory Visit (INDEPENDENT_AMBULATORY_CARE_PROVIDER_SITE_OTHER): Payer: Self-pay | Admitting: Internal Medicine

## 2011-01-06 VITALS — BP 130/88 | HR 78 | Temp 98.7°F | Ht 74.0 in | Wt 217.8 lb

## 2011-01-06 DIAGNOSIS — B20 Human immunodeficiency virus [HIV] disease: Secondary | ICD-10-CM

## 2011-01-06 NOTE — Assessment & Plan Note (Signed)
His infection remains under good control. I will continue his current regimen.

## 2011-01-06 NOTE — Progress Notes (Signed)
  Subjective:    Patient ID: Robert Ware, male    DOB: [birth year], 45 y.o.   MRN: [id number]  HPI Robert Ware is in for his routine visit. He is doing well without any problems. His pharmacy he was late filling some of his medications and he missed 2 days when they were calling to get his annual refills. He knew to stop taking all of his medicines until he had the full complement of his for antiretroviral medications. He has not missed any other doses.    Review of Systems     Objective:   Physical Exam  Constitutional: He appears well-nourished. No distress.  HENT:  Mouth/Throat: Oropharynx is clear and moist. No oropharyngeal exudate.  Cardiovascular: Normal rate, regular rhythm and normal heart sounds.   No murmur heard. Pulmonary/Chest: Breath sounds normal. He has no wheezes. He has no rales.  Abdominal: Soft. Bowel sounds are normal. There is no tenderness. There is no rebound.  Skin: No rash noted.  Psychiatric: He has a normal mood and affect.          Assessment & Plan:   HIV 1 RNA Quant (copies/mL)  Date Value  12/23/2010 25*  06/16/2010 <20   10/29/2009 <20 copies/mL      CD4 T Cell Abs (cmm)  Date Value  12/23/2010 270*  06/16/2010 230*  10/29/2009 240*

## 2011-02-12 ENCOUNTER — Other Ambulatory Visit: Payer: Self-pay | Admitting: *Deleted

## 2011-02-12 DIAGNOSIS — B20 Human immunodeficiency virus [HIV] disease: Secondary | ICD-10-CM

## 2011-02-12 MED ORDER — DARUNAVIR ETHANOLATE 800 MG PO TABS: 800.0000 mg | ORAL_TABLET | Freq: Every day | ORAL | Status: DC

## 2011-02-13 ENCOUNTER — Other Ambulatory Visit: Payer: Self-pay | Admitting: *Deleted

## 2011-02-13 DIAGNOSIS — B49 Unspecified mycosis: Secondary | ICD-10-CM

## 2011-02-13 MED ORDER — KETOCONAZOLE 2 % EX CREA: TOPICAL_CREAM | Freq: Two times a day (BID) | CUTANEOUS | Status: DC

## 2011-03-10 ENCOUNTER — Telehealth: Payer: Self-pay

## 2011-03-10 NOTE — Telephone Encounter (Signed)
Tried to call patient back after he called to make adap appt - his phone did not answer - neither one.

## 2011-03-23 ENCOUNTER — Ambulatory Visit: Payer: Self-pay

## 2011-05-27 ENCOUNTER — Other Ambulatory Visit: Payer: Self-pay | Admitting: *Deleted

## 2011-05-27 DIAGNOSIS — B49 Unspecified mycosis: Secondary | ICD-10-CM

## 2011-05-27 DIAGNOSIS — B20 Human immunodeficiency virus [HIV] disease: Secondary | ICD-10-CM

## 2011-05-27 MED ORDER — TENOFOVIR DISOPROXIL FUMARATE 300 MG PO TABS: 300.0000 mg | ORAL_TABLET | Freq: Every day | ORAL | Status: DC

## 2011-05-27 MED ORDER — KETOCONAZOLE 2 % EX CREA: TOPICAL_CREAM | Freq: Two times a day (BID) | CUTANEOUS | Status: DC

## 2011-06-19 ENCOUNTER — Other Ambulatory Visit: Payer: Self-pay | Admitting: Licensed Clinical Social Worker

## 2011-06-19 DIAGNOSIS — B20 Human immunodeficiency virus [HIV] disease: Secondary | ICD-10-CM

## 2011-06-19 MED ORDER — RITONAVIR 100 MG PO TABS: 100.0000 mg | ORAL_TABLET | ORAL | Status: DC

## 2011-06-19 MED ORDER — TENOFOVIR DISOPROXIL FUMARATE 300 MG PO TABS: 300.0000 mg | ORAL_TABLET | Freq: Every day | ORAL | Status: DC

## 2011-06-19 MED ORDER — ABACAVIR-LAMIVUDINE-ZIDOVUDINE 300-150-300 MG PO TABS: 1.0000 | ORAL_TABLET | Freq: Two times a day (BID) | ORAL | Status: DC

## 2011-07-07 ENCOUNTER — Other Ambulatory Visit (INDEPENDENT_AMBULATORY_CARE_PROVIDER_SITE_OTHER): Payer: Self-pay

## 2011-07-07 DIAGNOSIS — B20 Human immunodeficiency virus [HIV] disease: Secondary | ICD-10-CM

## 2011-07-07 DIAGNOSIS — Z79899 Other long term (current) drug therapy: Secondary | ICD-10-CM

## 2011-07-07 DIAGNOSIS — Z113 Encounter for screening for infections with a predominantly sexual mode of transmission: Secondary | ICD-10-CM

## 2011-07-07 LAB — COMPREHENSIVE METABOLIC PANEL
AST: 68 U/L — ABNORMAL HIGH (ref 0–37)
Albumin: 3.5 g/dL (ref 3.5–5.2)
Alkaline Phosphatase: 53 U/L (ref 39–117)
BUN: 17 mg/dL (ref 6–23)
Creat: 1 mg/dL (ref 0.50–1.35)
Potassium: 4 mEq/L (ref 3.5–5.3)

## 2011-07-07 LAB — LIPID PANEL
HDL: 48 mg/dL (ref >39)
LDL Cholesterol: 64 mg/dL (ref 0–99)
Total CHOL/HDL Ratio: 2.7 Ratio

## 2011-07-08 ENCOUNTER — Telehealth: Payer: Self-pay | Admitting: Internal Medicine

## 2011-07-08 LAB — CBC
HCT: 39.6 % (ref 39.0–52.0)
MCHC: 35.1 g/dL (ref 30.0–36.0)
Platelets: 27 10*3/uL — CL (ref 150–400)
RDW: 17 % — ABNORMAL HIGH (ref 11.5–15.5)

## 2011-07-08 LAB — HIV-1 RNA QUANT-NO REFLEX-BLD: HIV-1 RNA Quant, Log: <1.3 {Log} (ref <1.30)

## 2011-07-08 LAB — RPR

## 2011-07-08 NOTE — Telephone Encounter (Signed)
Called for critical lab value: Platelets were 27 which was repeated and verified by repeat and smear.  Patient has an appointment with Dr. Orvan Falconer of the [facility] on 07/21/11.  On review of his previous CBCs his platelets have run between 24-38 since 2008 and the value of 27 is consistent with his last check done on 06/16/10.  I will forward this note to Dr. Orvan Falconer for his attention when he sees the patient at the end of June.

## 2011-07-21 ENCOUNTER — Ambulatory Visit (INDEPENDENT_AMBULATORY_CARE_PROVIDER_SITE_OTHER): Payer: Self-pay | Admitting: Internal Medicine

## 2011-07-21 ENCOUNTER — Encounter: Payer: Self-pay | Admitting: Internal Medicine

## 2011-07-21 VITALS — BP 122/79 | HR 72 | Temp 98.1°F | Ht 74.0 in | Wt 197.0 lb

## 2011-07-21 DIAGNOSIS — B20 Human immunodeficiency virus [HIV] disease: Secondary | ICD-10-CM

## 2011-07-21 NOTE — Progress Notes (Signed)
Patient ID: Robert Ware, male   DOB: 06/17/1965, 46 y.o.   MRN: [id number]     [facility] for Infectious Disease  Patient Active Problem List  Diagnosis  . HIV DISEASE  . WARTS, OTHER SPECIFIED VIRAL  . THROMBOCYTOPENIA  . ERECTILE DYSFUNCTION  . GERD  . SEBORRHEA    Patient's Medications  New Prescriptions   No medications on file  Previous Medications   ABACAVIR-LAMIVUDINE-ZIDOVUDINE (TRIZIVIR) 300-150-300 MG PER TABLET    Take 1 tablet by mouth 2 (two) times daily.   DARUNAVIR 800 MG TABS    Take 800 mg by mouth daily with breakfast.   KETOCONAZOLE (NIZORAL) 2 % CREAM    Apply topically 2 (two) times daily.   RITONAVIR (NORVIR) 100 MG TABS    Take 1 tablet (100 mg total) by mouth 1 day or 1 dose.   TENOFOVIR (VIREAD) 300 MG TABLET    Take 1 tablet (300 mg total) by mouth daily.  Modified Medications   No medications on file  Discontinued Medications   No medications on file    Subjective: Robert Ware is in for his routine visit. He missed 2 days of his medications a few months ago when his pharmacy was 2 days late filling his Viread. He is doing well without any problems. He is now working third shift.   Objective: Temp: 98.1 F (36.7 C) (06/25 0927) Temp src: Oral (06/25 0927) BP: 122/79 mmHg (06/25 0927) Pulse Rate: 72  (06/25 0927)  General: His weight is down slightly but his BMI is normal at 25  Skin: No rash  Lungs: Clear  Cor: Regular S1 and S2 and no murmurs   Lab Results HIV 1 RNA Quant (copies/mL)  Date Value  07/07/2011 <20   12/23/2010 25*  06/16/2010 <20      CD4 T Cell Abs (cmm)  Date Value  07/07/2011 260*  12/23/2010 270*  06/16/2010 230*     Assessment: His HIV remains under excellent control. His chronic thrombocytopenia stable and asymptomatic.   Plan: 1. Continue current antiretroviral regimen 2. Followup after blood work in 6 months    Cliffton Asters, MD [facility] for [facility] [phone] pager   [phone] cell 07/21/2011, 9:38 AM

## 2011-09-04 ENCOUNTER — Other Ambulatory Visit: Payer: Self-pay | Admitting: *Deleted

## 2011-09-04 DIAGNOSIS — B49 Unspecified mycosis: Secondary | ICD-10-CM

## 2011-09-04 MED ORDER — KETOCONAZOLE 2 % EX CREA: TOPICAL_CREAM | Freq: Two times a day (BID) | CUTANEOUS | Status: DC

## 2011-09-24 ENCOUNTER — Ambulatory Visit: Payer: Self-pay

## 2011-09-25 ENCOUNTER — Ambulatory Visit: Payer: Self-pay

## 2011-09-29 ENCOUNTER — Telehealth: Payer: Self-pay

## 2011-09-29 NOTE — Telephone Encounter (Signed)
PATIENT'S PAYSTUBS REFLECT THAT HE HAS INSURANCE - CALLED PATIENT TO VERIFY WHAT HIS INSURANCE WILL COVER - ADVISED [facility] WILL NEED TO KNOW OR THEY WILL DENY THE  APPLICATION - HE ALSO NEEDS TO PUT INSURANCE INFO IN THE EPIC SYSTEM. WILL TAKE OUT RYAN WHITE AT END OF MONTH.

## 2011-10-07 ENCOUNTER — Ambulatory Visit: Payer: Self-pay

## 2011-10-15 ENCOUNTER — Ambulatory Visit: Payer: Self-pay

## 2011-10-29 ENCOUNTER — Other Ambulatory Visit: Payer: Self-pay | Admitting: Licensed Clinical Social Worker

## 2011-10-29 DIAGNOSIS — B20 Human immunodeficiency virus [HIV] disease: Secondary | ICD-10-CM

## 2011-10-29 MED ORDER — TENOFOVIR DISOPROXIL FUMARATE 300 MG PO TABS: 300.0000 mg | ORAL_TABLET | Freq: Every day | ORAL | Status: DC

## 2011-10-29 MED ORDER — RITONAVIR 100 MG PO TABS: 100.0000 mg | ORAL_TABLET | ORAL | Status: DC

## 2011-10-29 MED ORDER — DARUNAVIR ETHANOLATE 800 MG PO TABS: 800.0000 mg | ORAL_TABLET | Freq: Every day | ORAL | Status: DC

## 2011-10-29 MED ORDER — ABACAVIR-LAMIVUDINE-ZIDOVUDINE 300-150-300 MG PO TABS: 1.0000 | ORAL_TABLET | Freq: Two times a day (BID) | ORAL | Status: DC

## 2011-11-30 ENCOUNTER — Other Ambulatory Visit: Payer: Self-pay | Admitting: Internal Medicine

## 2011-12-31 ENCOUNTER — Telehealth: Payer: Self-pay | Admitting: Internal Medicine

## 2011-12-31 ENCOUNTER — Other Ambulatory Visit (INDEPENDENT_AMBULATORY_CARE_PROVIDER_SITE_OTHER): Payer: Self-pay

## 2011-12-31 DIAGNOSIS — Z79899 Other long term (current) drug therapy: Secondary | ICD-10-CM

## 2011-12-31 DIAGNOSIS — Z113 Encounter for screening for infections with a predominantly sexual mode of transmission: Secondary | ICD-10-CM

## 2011-12-31 DIAGNOSIS — B20 Human immunodeficiency virus [HIV] disease: Secondary | ICD-10-CM

## 2011-12-31 LAB — CBC
HCT: 41.6 % (ref 39.0–52.0)
Hemoglobin: 15.2 g/dL (ref 13.0–17.0)
MCH: 32.5 pg (ref 26.0–34.0)
MCHC: 36.5 g/dL — ABNORMAL HIGH (ref 30.0–36.0)

## 2011-12-31 LAB — LIPID PANEL
HDL: 51 mg/dL (ref >39)
LDL Cholesterol: 78 mg/dL (ref 0–99)
Triglycerides: 55 mg/dL (ref <150)
VLDL: 11 mg/dL (ref 0–40)

## 2011-12-31 LAB — COMPREHENSIVE METABOLIC PANEL
Albumin: 4.1 g/dL (ref 3.5–5.2)
Alkaline Phosphatase: 53 U/L (ref 39–117)
BUN: 12 mg/dL (ref 6–23)
Glucose, Bld: 80 mg/dL (ref 70–99)
Total Bilirubin: 0.8 mg/dL (ref 0.3–1.2)

## 2011-12-31 NOTE — Telephone Encounter (Signed)
Lab called to inform critical lab value of Platelet 19K.  I reviewed patient's chart and he does have chronic thrombocytopenia and last platelet was 27K in 06/2011.  I tried to call patient to make sure that he is not actively bleeding but was unable to reach patient.  I will forward this message to his PCP, Dr. Orvan Falconer.

## 2012-01-04 ENCOUNTER — Other Ambulatory Visit: Payer: Self-pay | Admitting: Internal Medicine

## 2012-01-14 ENCOUNTER — Ambulatory Visit (INDEPENDENT_AMBULATORY_CARE_PROVIDER_SITE_OTHER): Payer: Self-pay | Admitting: Internal Medicine

## 2012-01-14 ENCOUNTER — Encounter: Payer: Self-pay | Admitting: Internal Medicine

## 2012-01-14 VITALS — BP 114/77 | HR 69 | Temp 98.5°F | Wt 200.0 lb

## 2012-01-14 DIAGNOSIS — Z23 Encounter for immunization: Secondary | ICD-10-CM

## 2012-01-14 DIAGNOSIS — B20 Human immunodeficiency virus [HIV] disease: Secondary | ICD-10-CM

## 2012-01-14 NOTE — Progress Notes (Signed)
Patient ID: Robert Ware, male   DOB: April 11, 1965, 46 y.o.   MRN: [id number]     [facility] for Infectious Disease  Patient Active Problem List  Diagnosis  . HIV DISEASE  . WARTS, OTHER SPECIFIED VIRAL  . THROMBOCYTOPENIA  . ERECTILE DYSFUNCTION  . GERD  . SEBORRHEA    Patient's Medications  New Prescriptions   No medications on file  Previous Medications   ABACAVIR-LAMIVUDINE-ZIDOVUDINE (TRIZIVIR) 300-150-300 MG PER TABLET    Take 1 tablet by mouth 2 (two) times daily.   DARUNAVIR ETHANOLATE (PREZISTA) 800 MG TABLET    Take 1 tablet (800 mg total) by mouth daily with breakfast.   KETOCONAZOLE (NIZORAL) 2 % CREAM    APPLY TWICE DAILY   MULTIPLE VITAMINS-MINERALS (MULTIVITAMIN PO)    Take by mouth.   RITONAVIR (NORVIR) 100 MG TABS    Take 1 tablet (100 mg total) by mouth 1 day or 1 dose.   TENOFOVIR (VIREAD) 300 MG TABLET    Take 1 tablet (300 mg total) by mouth daily.  Modified Medications   No medications on file  Discontinued Medications   No medications on file    Subjective: Robert Ware is in for his routine visit. He states that his only missed 2 days of his HIV medicines since his last visit. This occurred when his pharmacy was 2 days late sending his Prezista. He knew to stop taking his other medications until he had all of them. He is feeling well and has had no new problems or complaints since his last visit.  Objective: Temp: 98.5 F (36.9 C) (12/19 0943) Temp src: Oral (12/19 0943) BP: 114/77 mmHg (12/19 0943) Pulse Rate: 69  (12/19 0943)  General: He is in good spirits his usual Skin: No rash Lungs: Clear Cor: Regular S1 and S2 with no murmurs  Lab Results HIV 1 RNA Quant (copies/mL)  Date Value  12/31/2011 <20   07/07/2011 <20   12/23/2010 25*     CD4 T Cell Abs (cmm)  Date Value  12/31/2011 270*  07/07/2011 260*  12/23/2010 270*     Assessment: His HIV infection remains under excellent control. He has chronic, stable asymptomatic  thrombocytopenia related to his infection.  Plan: 1. Continue current antiretroviral regimen 2. Followup after lab work in 6 months   Cliffton Asters, MD [facility] for Infectious Disease [facility] [phone] pager   [phone] cell 01/14/2012, 10:00 AM

## 2012-02-02 ENCOUNTER — Other Ambulatory Visit: Payer: Self-pay | Admitting: Internal Medicine

## 2012-04-19 ENCOUNTER — Ambulatory Visit: Payer: Self-pay

## 2012-05-26 ENCOUNTER — Other Ambulatory Visit: Payer: Self-pay | Admitting: *Deleted

## 2012-05-26 DIAGNOSIS — B20 Human immunodeficiency virus [HIV] disease: Secondary | ICD-10-CM

## 2012-05-26 MED ORDER — ABACAVIR-LAMIVUDINE-ZIDOVUDINE 300-150-300 MG PO TABS: 1.0000 | ORAL_TABLET | Freq: Two times a day (BID) | ORAL | Status: DC

## 2012-05-26 MED ORDER — DARUNAVIR ETHANOLATE 800 MG PO TABS: 800.0000 mg | ORAL_TABLET | Freq: Every day | ORAL | Status: DC

## 2012-05-26 MED ORDER — TENOFOVIR DISOPROXIL FUMARATE 300 MG PO TABS: 300.0000 mg | ORAL_TABLET | Freq: Every day | ORAL | Status: DC

## 2012-05-26 MED ORDER — RITONAVIR 100 MG PO TABS: 100.0000 mg | ORAL_TABLET | ORAL | Status: DC

## 2012-06-28 ENCOUNTER — Other Ambulatory Visit: Payer: Self-pay | Admitting: Internal Medicine

## 2012-06-28 ENCOUNTER — Other Ambulatory Visit: Payer: Self-pay

## 2012-06-28 DIAGNOSIS — B20 Human immunodeficiency virus [HIV] disease: Secondary | ICD-10-CM

## 2012-06-28 LAB — COMPREHENSIVE METABOLIC PANEL
ALT: 27 U/L (ref 0–53)
AST: 40 U/L — ABNORMAL HIGH (ref 0–37)
Alkaline Phosphatase: 48 U/L (ref 39–117)
Creat: 0.92 mg/dL (ref 0.50–1.35)
Sodium: 139 mEq/L (ref 135–145)
Total Bilirubin: 1.3 mg/dL — ABNORMAL HIGH (ref 0.3–1.2)
Total Protein: 6.7 g/dL (ref 6.0–8.3)

## 2012-06-28 LAB — LIPID PANEL
Cholesterol: 138 mg/dL (ref 0–200)
LDL Cholesterol: 77 mg/dL (ref 0–99)
Total CHOL/HDL Ratio: 3.1 Ratio
VLDL: 17 mg/dL (ref 0–40)

## 2012-06-29 ENCOUNTER — Telehealth: Payer: Self-pay | Admitting: Internal Medicine

## 2012-06-29 LAB — CBC
MCH: 32 pg (ref 26.0–34.0)
MCHC: 36.4 g/dL — ABNORMAL HIGH (ref 30.0–36.0)
MCV: 87.9 fL (ref 78.0–100.0)
Platelets: 26 10*3/uL — CL (ref 150–400)
RDW: 17.2 % — ABNORMAL HIGH (ref 11.5–15.5)

## 2012-06-29 LAB — RPR

## 2012-06-29 LAB — HIV-1 RNA QUANT-NO REFLEX-BLD: HIV 1 RNA Quant: <20 copies/mL (ref <20)

## 2012-06-29 LAB — T-HELPER CELL (CD4) - (RCID CLINIC ONLY): CD4 T Cell Abs: 240 uL — ABNORMAL LOW (ref 400–2700)

## 2012-06-29 NOTE — Telephone Encounter (Signed)
Called by [facility] lab to alert a critical lab value, platelets = 26.  Patient has history of chronic thrombocytopenia.   I did speak with the patient, and confirmed that he is not actively bleeding.  I will forward note to patient's PCP, Dr. Orvan Falconer.

## 2012-07-12 ENCOUNTER — Ambulatory Visit (INDEPENDENT_AMBULATORY_CARE_PROVIDER_SITE_OTHER): Payer: Self-pay | Admitting: Internal Medicine

## 2012-07-12 ENCOUNTER — Encounter: Payer: Self-pay | Admitting: Internal Medicine

## 2012-07-12 VITALS — BP 135/88 | HR 79 | Temp 98.1°F | Ht 74.0 in | Wt 212.5 lb

## 2012-07-12 DIAGNOSIS — B20 Human immunodeficiency virus [HIV] disease: Secondary | ICD-10-CM

## 2012-07-12 NOTE — Progress Notes (Signed)
Patient ID: Robert Ware, male   DOB: 1965-08-20, 47 y.o.   MRN: [id number]          [facility] for Infectious Disease  Patient Active Problem List   Diagnosis Date Noted  . SEBORRHEA 04/17/2008  . HIV DISEASE 02/06/2006  . WARTS, OTHER SPECIFIED VIRAL 02/06/2006  . THROMBOCYTOPENIA 02/06/2006  . ERECTILE DYSFUNCTION 02/06/2006  . GERD 02/06/2006    Patient's Medications  New Prescriptions   No medications on file  Previous Medications   ABACAVIR-LAMIVUDINE-ZIDOVUDINE (TRIZIVIR) 300-150-300 MG PER TABLET    Take 1 tablet by mouth 2 (two) times daily.   DARUNAVIR ETHANOLATE (PREZISTA) 800 MG TABLET    Take 1 tablet (800 mg total) by mouth daily with breakfast.   KETOCONAZOLE (NIZORAL) 2 % CREAM    APPLY TWICE DAILY   MULTIPLE VITAMINS-MINERALS (MULTIVITAMIN PO)    Take by mouth.   RITONAVIR (NORVIR) 100 MG TABS    Take 1 tablet (100 mg total) by mouth 1 day or 1 dose.   TENOFOVIR (VIREAD) 300 MG TABLET    Take 1 tablet (300 mg total) by mouth daily.  Modified Medications   No medications on file  Discontinued Medications   No medications on file    Subjective: Robert Ware is in for his routine visit. He missed 3 days of his HIV medicines since his last visit when his pharmacy was late on one refill. He is doing well without any complaints.  Review of Systems: Pertinent items are noted in HPI.  No past medical history on file.  History  Substance Use Topics  . Smoking status: Never Smoker   . Smokeless tobacco: Never Used  . Alcohol Use: No     Comment: occassionaly    Family History  Problem Relation Age of Onset  . Stroke Father   . Diabetes Mother   . Hypertension Mother     No Known Allergies  Objective: Temp: 98.1 F (36.7 C) (06/17 0906) Temp src: Oral (06/17 0906) BP: 135/88 mmHg (06/17 0906) Pulse Rate: 79 (06/17 0906)  General: He is in good spirits as usual Oral: No oropharyngeal lesions Skin: No rash Lungs: Clear Cor: Regular S1 and  S2 and no murmurs Abdomen: Soft and nontender Mood and affect normal  Lab Results HIV 1 RNA Quant (copies/mL)  Date Value  06/28/2012 <20   12/31/2011 <20   07/07/2011 <20      CD4 T Cell Abs (cmm)  Date Value  06/28/2012 240*  12/31/2011 270*  07/07/2011 260*     Lab Results  Component Value Date   WBC 2.6* 06/28/2012   HGB 15.4 06/28/2012   HCT 42.3 06/28/2012   MCV 87.9 06/28/2012   PLT 26* 06/28/2012   BMET    Component Value Date/Time   NA 139 06/28/2012 1138   K 4.0 06/28/2012 1138   CL 106 06/28/2012 1138   CO2 21 06/28/2012 1138   GLUCOSE 85 06/28/2012 1138   BUN 9 06/28/2012 1138   CREATININE 0.92 06/28/2012 1138   CREATININE 1.03 04/03/2009 2016   CALCIUM 8.8 06/28/2012 1138   Lab Results  Component Value Date   ALT 27 06/28/2012   AST 40* 06/28/2012   ALKPHOS 48 06/28/2012   BILITOT 1.3* 06/28/2012   Lab Results  Component Value Date   CHOL 138 06/28/2012   HDL 44 06/28/2012   LDLCALC 77 06/28/2012   TRIG 85 06/28/2012   CHOLHDL 3.1 06/28/2012   Assessment: Robert Ware's current salvage regimen and is  continuing to suppress his virus. However, he has an extremely multidrug resistant virus and currently is essentially on a single drug regimen with Darunavir being the only predictably active agent. There are no obvious good choices for an RT backbone but, at the very least, I think adding an integrase inhibitor to Darunavir makes sense. 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  Plan: 1. Start salvage regimen of Truvada, Tivicay and boosted Prezista   Robert Asters, MD [facility] for Infectious Disease [facility] [phone] pager   [phone] cell 07/12/2012, 9:29 AM

## 2012-07-12 NOTE — Progress Notes (Signed)
HPI: Robert Ware is a 47 y.o. male with HIV diagnosed in 73. Patient has been on many drugs in the past and has a highly resistant virus but has been doing well on salvage regimen despite only 1 fully active drug (darunavir).  Allergies: No Known Allergies  Vitals: Temp: 98.1 F (36.7 C) (06/17 0906) Temp src: Oral (06/17 0906) BP: 135/88 mmHg (06/17 0906) Pulse Rate: 79 (06/17 0906)  Past Medical History: No past medical history on file.  Social History: History   Social History  . Marital Status: Married    Spouse Name: N/A    Number of Children: N/A  . Years of Education: N/A   Social History Main Topics  . Smoking status: Never Smoker   . Smokeless tobacco: Never Used  . Alcohol Use: No     Comment: occassionaly  . Drug Use: No  . Sexually Active: Not Currently     Comment: declined condoms   Other Topics Concern  . None   Social History Narrative  . None    Current Regimen: Trizivir (lamivudine, abacavir, zidovudine), tenofovir, darunavir, ritonavir  Labs: HIV 1 RNA Quant (copies/mL)  Date Value  06/28/2012 <20   12/31/2011 <20   07/07/2011 <20      CD4 T Cell Abs (cmm)  Date Value  06/28/2012 240*  12/31/2011 270*  07/07/2011 260*     Hep B S Ab (no units)  Date Value  03/22/2006 No      Hepatitis B Surface Ag (no units)  Date Value  03/22/2006 No      HCV Ab (no units)  Date Value  03/22/2006 No     CrCl: Estimated Creatinine Clearance: 114.2 ml/min (by C-G formula based on Cr of 0.92).  Lipids:    Component Value Date/Time   CHOL 138 06/28/2012 1138   TRIG 85 06/28/2012 1138   HDL 44 06/28/2012 1138   CHOLHDL 3.1 06/28/2012 1138   VLDL 17 06/28/2012 1138   LDLCALC 77 06/28/2012 1138    Assessment: Spoke with the [facility] consultation hotline about options given the patient's extensive resistance profile. They agreed that reducing from 4 nucleosides to a combination drug like Truvada or Epzicom would be reasonable along with starting  dolutegravir and continuing darunavir. This would allow once daily dosing and have 2 fully active drugs with a high barrier to further resistance. Between Truvada and Epzicom I would favor Epzicom solely for the convience of it coming co-formulated with dolutegravir within the next year. Patient is following up next week to discuss change in regimen.  Recommendations: - Change ART to darunavir, ritonavir, dolutegravir, and Epzicom  Drue Stager, PharmD [facility] for Infectious Disease 07/12/2012, 2:15 PM

## 2012-07-18 ENCOUNTER — Ambulatory Visit (INDEPENDENT_AMBULATORY_CARE_PROVIDER_SITE_OTHER): Payer: Self-pay | Admitting: Internal Medicine

## 2012-07-18 ENCOUNTER — Encounter: Payer: Self-pay | Admitting: Internal Medicine

## 2012-07-18 ENCOUNTER — Ambulatory Visit: Payer: Self-pay | Admitting: Internal Medicine

## 2012-07-18 VITALS — BP 129/79 | Temp 98.1°F | Ht 74.0 in | Wt 214.0 lb

## 2012-07-18 DIAGNOSIS — B20 Human immunodeficiency virus [HIV] disease: Secondary | ICD-10-CM

## 2012-07-18 MED ORDER — DOLUTEGRAVIR SODIUM 50 MG PO TABS: 50.0000 mg | ORAL_TABLET | Freq: Every day | ORAL | Status: DC

## 2012-07-18 MED ORDER — EMTRICITABINE-TENOFOVIR DF 200-300 MG PO TABS: 1.0000 | ORAL_TABLET | Freq: Every day | ORAL | Status: DC

## 2012-07-18 NOTE — Progress Notes (Signed)
Patient ID: Robert Ware, male   DOB: February 19, 1965, 47 y.o.   MRN: [id number]          [facility] for Infectious Disease  Patient Active Problem List   Diagnosis Date Noted  . SEBORRHEA 04/17/2008  . HIV DISEASE 02/06/2006  . WARTS, OTHER SPECIFIED VIRAL 02/06/2006  . THROMBOCYTOPENIA 02/06/2006  . ERECTILE DYSFUNCTION 02/06/2006  . GERD 02/06/2006    Patient's Medications  New Prescriptions   DOLUTEGRAVIR (TIVICAY) 50 MG TABLET    Take 1 tablet (50 mg total) by mouth daily.   EMTRICITABINE-TENOFOVIR (TRUVADA) 200-300 MG PER TABLET    Take 1 tablet by mouth daily.  Previous Medications   DARUNAVIR ETHANOLATE (PREZISTA) 800 MG TABLET    Take 1 tablet (800 mg total) by mouth daily with breakfast.   KETOCONAZOLE (NIZORAL) 2 % CREAM    APPLY TWICE DAILY   MULTIPLE VITAMINS-MINERALS (MULTIVITAMIN PO)    Take by mouth.   RITONAVIR (NORVIR) 100 MG TABS    Take 1 tablet (100 mg total) by mouth 1 day or 1 dose.  Modified Medications   No medications on file  Discontinued Medications   ABACAVIR-LAMIVUDINE-ZIDOVUDINE (TRIZIVIR) 300-150-300 MG PER TABLET    Take 1 tablet by mouth 2 (two) times daily.   TENOFOVIR (VIREAD) 300 MG TABLET    Take 1 tablet (300 mg total) by mouth daily.    Subjective: [name] is in for his one-week followup visit. He continues to take his old regimen of Trizivir, Viread and boosted Prezista. After his last visit I did review all of his previous resistance data and will simplify and strengthen his salvage regimen by switching to Truvada, Tivicay and boosted Prezista. The addition of Tivicay will strengthen his regimen and will also allow once daily dosing.   No past medical history on file.  History  Substance Use Topics  . Smoking status: Never Smoker   . Smokeless tobacco: Never Used  . Alcohol Use: No     Comment: occassionaly    Family History  Problem Relation Age of Onset  . Stroke Father   . Diabetes Mother   . Hypertension Mother      No Known Allergies  Objective: Temp: 98.1 F (36.7 C) (06/23 1516) Temp src: Oral (06/23 1516) BP: 129/79 mmHg (06/23 1516)  General: He is in good spirits as usual   Lab Results HIV 1 RNA Quant (copies/mL)  Date Value  06/28/2012 <20   12/31/2011 <20   07/07/2011 <20      CD4 T Cell Abs (cmm)  Date Value  06/28/2012 240*  12/31/2011 270*  07/07/2011 260*     Assessment: Change to a new stronger, simple her salvage regimen.  Plan: 1. Start Truvada, Tivicay, Prezista and Norvir 2. Followup after lab work in 3 months   Cliffton Asters, MD [facility] for Infectious Disease [facility] [phone] pager   [phone] cell 07/18/2012, 3:54 PM

## 2012-10-03 ENCOUNTER — Ambulatory Visit: Payer: Self-pay

## 2012-10-11 ENCOUNTER — Other Ambulatory Visit (INDEPENDENT_AMBULATORY_CARE_PROVIDER_SITE_OTHER): Payer: Self-pay

## 2012-10-11 DIAGNOSIS — Z113 Encounter for screening for infections with a predominantly sexual mode of transmission: Secondary | ICD-10-CM

## 2012-10-11 DIAGNOSIS — B20 Human immunodeficiency virus [HIV] disease: Secondary | ICD-10-CM

## 2012-10-11 LAB — COMPLETE METABOLIC PANEL WITH GFR
AST: 44 U/L — ABNORMAL HIGH (ref 0–37)
Alkaline Phosphatase: 60 U/L (ref 39–117)
BUN: 12 mg/dL (ref 6–23)
Calcium: 8.7 mg/dL (ref 8.4–10.5)
Creat: 0.99 mg/dL (ref 0.50–1.35)
GFR, Est African American: >89 mL/min
Glucose, Bld: 118 mg/dL — ABNORMAL HIGH (ref 70–99)
Total Bilirubin: 0.6 mg/dL (ref 0.3–1.2)

## 2012-10-12 ENCOUNTER — Telehealth: Payer: Self-pay | Admitting: Internal Medicine

## 2012-10-12 LAB — HIV-1 RNA QUANT-NO REFLEX-BLD: HIV 1 RNA Quant: <20 copies/mL (ref <20)

## 2012-10-12 LAB — CBC
MCH: 30.4 pg (ref 26.0–34.0)
MCHC: 36.3 g/dL — ABNORMAL HIGH (ref 30.0–36.0)
Platelets: 26 10*3/uL — CL (ref 150–400)
RBC: 5.13 MIL/uL (ref 4.22–5.81)
RDW: 14.4 % (ref 11.5–15.5)

## 2012-10-12 NOTE — Telephone Encounter (Addendum)
Called by [facility] lab partners at 2:25 am on 10/12/12. Critical low platelet count at 26 (though this is the same as 06/2012 and consistently low since 2010). WBC 2.3 and consistently low since 2008.Marland Kitchen MCHC 36.3 (which also has been stable), AST 44 (which is stable), glucose 118. Will make PCP Dr. Cliffton Asters aware (sent EPIC message).  F/u appt scheduled 10/25/12 at 10:30 am with PCP already.  Called to see if patient was bleeding but no answer on home and VM did not clearly state it was the pt so did not leave message and also work # listed did not clearly state it was the pt so did not leave a message.    Shirlee Latch MD [phone]

## 2012-10-17 ENCOUNTER — Other Ambulatory Visit: Payer: Self-pay

## 2012-10-25 ENCOUNTER — Encounter: Payer: Self-pay | Admitting: Internal Medicine

## 2012-10-25 ENCOUNTER — Ambulatory Visit (INDEPENDENT_AMBULATORY_CARE_PROVIDER_SITE_OTHER): Payer: Self-pay | Admitting: Internal Medicine

## 2012-10-25 VITALS — BP 123/86 | HR 79 | Temp 98.3°F | Ht 74.0 in | Wt 216.0 lb

## 2012-10-25 DIAGNOSIS — B20 Human immunodeficiency virus [HIV] disease: Secondary | ICD-10-CM

## 2012-10-25 DIAGNOSIS — Z23 Encounter for immunization: Secondary | ICD-10-CM

## 2012-10-25 NOTE — Progress Notes (Signed)
Patient ID: Robert Ware, male   DOB: [birth year], 47 y.o.   MRN: [id number]          [facility] for Infectious Disease  Patient Active Problem List   Diagnosis Date Noted  . SEBORRHEA 04/17/2008  . HIV DISEASE 02/06/2006  . WARTS, OTHER SPECIFIED VIRAL 02/06/2006  . THROMBOCYTOPENIA 02/06/2006  . ERECTILE DYSFUNCTION 02/06/2006  . GERD 02/06/2006    Patient's Medications  New Prescriptions   No medications on file  Previous Medications   DARUNAVIR ETHANOLATE (PREZISTA) 800 MG TABLET    Take 1 tablet (800 mg total) by mouth daily with breakfast.   DOLUTEGRAVIR (TIVICAY) 50 MG TABLET    Take 1 tablet (50 mg total) by mouth daily.   EMTRICITABINE-TENOFOVIR (TRUVADA) 200-300 MG PER TABLET    Take 1 tablet by mouth daily.   KETOCONAZOLE (NIZORAL) 2 % CREAM    APPLY TWICE DAILY   MULTIPLE VITAMINS-MINERALS (MULTIVITAMIN PO)    Take by mouth.   RITONAVIR (NORVIR) 100 MG TABS    Take 1 tablet (100 mg total) by mouth 1 day or 1 dose.  Modified Medications   No medications on file  Discontinued Medications   No medications on file    Subjective: Robert Ware is in for his routine visit. He first started his new regimen of Truvada, Tivicay, Prezista and Norvir he had some upset stomach but that resolved after one week in his had no further problems. He has not missed any doses. He is feeling very well without any new problems or concerns. Review of Systems: Pertinent items are noted in HPI.  No past medical history on file.  History  Substance Use Topics  . Smoking status: Never Smoker   . Smokeless tobacco: Never Used  . Alcohol Use: No     Comment: occassionaly    Family History  Problem Relation Age of Onset  . Stroke Father   . Diabetes Mother   . Hypertension Mother     No Known Allergies  Objective: Temp: 98.3 F (36.8 C) (09/30 1049) Temp src: Oral (09/30 1049) BP: 123/86 mmHg (09/30 1049) Pulse Rate: 79 (09/30 1049)  General: He is in good spirits as  usual Oral: No oropharyngeal lesions Skin: No rash Lungs: Clear Cor: Regular S1 and S2 no murmurs Mood and affect: Normal  Lab Results HIV 1 RNA Quant (copies/mL)  Date Value  10/11/2012 <20   06/28/2012 <20   12/31/2011 <20      CD4 T Cell Abs (/uL)  Date Value  10/11/2012 290*  06/28/2012 240*  12/31/2011 270*     Assessment: His HIV infection is under excellent control with a once daily regimen.  Plan: 1. Continue current antiretroviral regimen 2. Influenza vaccination today 3. Followup after lab work in 6 months   Cliffton Asters, MD [facility] for Infectious Disease [facility] [phone] pager   [phone] cell 10/25/2012, 11:15 AM

## 2012-11-15 ENCOUNTER — Other Ambulatory Visit: Payer: Self-pay | Admitting: *Deleted

## 2012-11-15 DIAGNOSIS — B20 Human immunodeficiency virus [HIV] disease: Secondary | ICD-10-CM

## 2012-11-15 MED ORDER — DOLUTEGRAVIR SODIUM 50 MG PO TABS: 50.0000 mg | ORAL_TABLET | Freq: Every day | ORAL | Status: DC

## 2012-11-15 MED ORDER — DARUNAVIR ETHANOLATE 800 MG PO TABS: 800.0000 mg | ORAL_TABLET | Freq: Every day | ORAL | Status: DC

## 2012-11-15 MED ORDER — RITONAVIR 100 MG PO TABS: 100.0000 mg | ORAL_TABLET | Freq: Every day | ORAL | Status: DC

## 2012-11-15 MED ORDER — EMTRICITABINE-TENOFOVIR DF 200-300 MG PO TABS: 1.0000 | ORAL_TABLET | Freq: Every day | ORAL | Status: DC

## 2012-12-02 ENCOUNTER — Telehealth: Payer: Self-pay | Admitting: *Deleted

## 2012-12-02 NOTE — Telephone Encounter (Signed)
Patient called and advised that he has been having headaches for the past 3 days and trouble seeing at night. He wants to see the doctor today. Advised him we do not have a doctor to see him today but I can get him in next week and offered him an appt 12/05/12 but he advised will wait until 12/07/12 to see his doctor. Advised he took his BP at [facility] and it was 147/89 advised him that is not to bad but to watch it and if he feel light headed or has sever chest pain or sudden vision loss to get to the ED immediatly.

## 2012-12-07 ENCOUNTER — Ambulatory Visit (INDEPENDENT_AMBULATORY_CARE_PROVIDER_SITE_OTHER): Payer: Self-pay | Admitting: Internal Medicine

## 2012-12-07 VITALS — BP 128/77 | HR 76 | Temp 98.3°F | Ht 74.0 in | Wt 219.0 lb

## 2012-12-07 DIAGNOSIS — R51 Headache: Secondary | ICD-10-CM

## 2012-12-07 DIAGNOSIS — R519 Headache, unspecified: Secondary | ICD-10-CM | POA: Insufficient documentation

## 2012-12-07 NOTE — Progress Notes (Signed)
Patient ID: Robert Ware, male   DOB: [birth year], 47 y.o.   MRN: [id number]          [facility] for Infectious Disease  Patient Active Problem List   Diagnosis Date Noted  . Headache 12/07/2012  . SEBORRHEA 04/17/2008  . HIV DISEASE 02/06/2006  . WARTS, OTHER SPECIFIED VIRAL 02/06/2006  . THROMBOCYTOPENIA 02/06/2006  . ERECTILE DYSFUNCTION 02/06/2006  . GERD 02/06/2006    Patient's Medications  New Prescriptions   No medications on file  Previous Medications   DARUNAVIR ETHANOLATE (PREZISTA) 800 MG TABLET    Take 1 tablet (800 mg total) by mouth daily with breakfast.   DOLUTEGRAVIR (TIVICAY) 50 MG TABLET    Take 1 tablet (50 mg total) by mouth daily.   EMTRICITABINE-TENOFOVIR (TRUVADA) 200-300 MG PER TABLET    Take 1 tablet by mouth daily.   MULTIPLE VITAMINS-MINERALS (MULTIVITAMIN PO)    Take by mouth.   RITONAVIR (NORVIR) 100 MG TABS TABLET    Take 1 tablet (100 mg total) by mouth daily.  Modified Medications   No medications on file  Discontinued Medications   KETOCONAZOLE (NIZORAL) 2 % CREAM    APPLY TWICE DAILY    Subjective: Robert Ware is seen on a walk-in basis. 5 days ago he developed some diffuse headache. He did not sleep well the night before. He had a slight cough and felt like he may be developing a cold. He went to [employer] and checked his blood pressure and found that it was slightly elevated around 145/105. He started to feel better over the next few days and now is completely back to normal.  He has prescription bifocal eyeglasses but rarely wears them. He says that he is just not in the habit of wearing them. When he doesn't wear them he sometimes has difficulty driving at night and he also has more difficulty reading fine print.  Review of Systems: Pertinent items are noted in HPI.  No past medical history on file.  History  Substance Use Topics  . Smoking status: Never Smoker   . Smokeless tobacco: Never Used  . Alcohol Use: No     Comment:  occassionaly    Family History  Problem Relation Age of Onset  . Stroke Father   . Diabetes Mother   . Hypertension Mother     No Known Allergies  Objective: Temp: 98.3 F (36.8 C) (11/12 1537) Temp src: Oral (11/12 1537) BP: 128/77 mmHg (11/12 1537) Pulse Rate: 76 (11/12 1537)  General: He is in good spirits as usual Oral: No oropharyngeal lesions Eyes: Normal external exam Skin: No rash Lungs: Clear Cor: Regular S1 and S2 with no murmurs  Lab Results HIV 1 RNA Quant (copies/mL)  Date Value  10/11/2012 <20   06/28/2012 <20   12/31/2011 <20      CD4 T Cell Abs (/uL)  Date Value  10/11/2012 290*  06/28/2012 240*  12/31/2011 270*     Assessment: Sounds like he may have had a transient upper respiratory illness with some headache and mild elevation of blood pressure that is now self corrected. He has a more chronic visual changes that are corrected when he does wear his glasses.  Plan: 1. Continue current antiretroviral regimen 2. Encouraged to wear her glasses on a regular basis, especially when driving 3. Keep previously scheduled visit early next year   Cliffton Asters, MD [facility] for Infectious Disease [facility] [phone] pager   [phone] cell 12/07/2012, 3:57 PM

## 2013-03-03 ENCOUNTER — Ambulatory Visit: Payer: Self-pay

## 2013-03-13 ENCOUNTER — Other Ambulatory Visit: Payer: Self-pay | Admitting: *Deleted

## 2013-03-13 DIAGNOSIS — B20 Human immunodeficiency virus [HIV] disease: Secondary | ICD-10-CM

## 2013-03-13 MED ORDER — DARUNAVIR ETHANOLATE 800 MG PO TABS: 800.0000 mg | ORAL_TABLET | Freq: Every day | ORAL | Status: DC

## 2013-03-13 MED ORDER — EMTRICITABINE-TENOFOVIR DF 200-300 MG PO TABS: 1.0000 | ORAL_TABLET | Freq: Every day | ORAL | Status: DC

## 2013-03-13 MED ORDER — DOLUTEGRAVIR SODIUM 50 MG PO TABS: 50.0000 mg | ORAL_TABLET | Freq: Every day | ORAL | Status: DC

## 2013-03-13 MED ORDER — RITONAVIR 100 MG PO TABS: 100.0000 mg | ORAL_TABLET | Freq: Every day | ORAL | Status: DC

## 2013-04-24 ENCOUNTER — Other Ambulatory Visit (INDEPENDENT_AMBULATORY_CARE_PROVIDER_SITE_OTHER): Payer: Self-pay

## 2013-04-24 DIAGNOSIS — Z113 Encounter for screening for infections with a predominantly sexual mode of transmission: Secondary | ICD-10-CM

## 2013-04-24 DIAGNOSIS — B20 Human immunodeficiency virus [HIV] disease: Secondary | ICD-10-CM

## 2013-04-24 DIAGNOSIS — Z79899 Other long term (current) drug therapy: Secondary | ICD-10-CM

## 2013-04-24 LAB — COMPREHENSIVE METABOLIC PANEL
ALT: 36 U/L (ref 0–53)
AST: 45 U/L — ABNORMAL HIGH (ref 0–37)
Albumin: 3.6 g/dL (ref 3.5–5.2)
Alkaline Phosphatase: 49 U/L (ref 39–117)
BUN: 11 mg/dL (ref 6–23)
CALCIUM: 8.2 mg/dL — AB (ref 8.4–10.5)
CHLORIDE: 105 meq/L (ref 96–112)
CO2: 24 meq/L (ref 19–32)
Creat: 0.98 mg/dL (ref 0.50–1.35)
Glucose, Bld: 83 mg/dL (ref 70–99)
POTASSIUM: 3.9 meq/L (ref 3.5–5.3)
SODIUM: 137 meq/L (ref 135–145)
TOTAL PROTEIN: 6.8 g/dL (ref 6.0–8.3)
Total Bilirubin: 1 mg/dL (ref 0.2–1.2)

## 2013-04-24 LAB — LIPID PANEL
Cholesterol: 143 mg/dL (ref 0–200)
HDL: 56 mg/dL (ref >39)
LDL Cholesterol: 78 mg/dL (ref 0–99)
Total CHOL/HDL Ratio: 2.6 Ratio
Triglycerides: 47 mg/dL (ref <150)
VLDL: 9 mg/dL (ref 0–40)

## 2013-04-25 ENCOUNTER — Telehealth: Payer: Self-pay | Admitting: Internal Medicine

## 2013-04-25 LAB — CBC
HCT: 45.3 % (ref 39.0–52.0)
Hemoglobin: 16.1 g/dL (ref 13.0–17.0)
MCH: 27.9 pg (ref 26.0–34.0)
MCHC: 35.5 g/dL (ref 30.0–36.0)
MCV: 78.5 fL (ref 78.0–100.0)
PLATELETS: 21 10*3/uL — AB (ref 150–400)
RBC: 5.77 MIL/uL (ref 4.22–5.81)
RDW: 17.9 % — ABNORMAL HIGH (ref 11.5–15.5)
WBC: 3.2 10*3/uL — AB (ref 4.0–10.5)

## 2013-04-25 LAB — RPR

## 2013-04-25 LAB — HIV-1 RNA QUANT-NO REFLEX-BLD
HIV 1 RNA Quant: <20 copies/mL (ref <20)
HIV-1 RNA Quant, Log: <1.3 {Log} (ref <1.30)

## 2013-04-25 LAB — T-HELPER CELL (CD4) - (RCID CLINIC ONLY)
CD4 % Helper T Cell: 30 % — ABNORMAL LOW (ref 33–55)
CD4 T Cell Abs: 260 /uL — ABNORMAL LOW (ref 400–2700)

## 2013-04-25 NOTE — Telephone Encounter (Signed)
  INTERNAL MEDICINE RESIDENCY PROGRAM After-Hours Telephone Call    Reason for call:   I received a lab [facility] call with regarding to the lab result. Patient had CBC done today and platelet was 21, which was confirmed by lab [facility] with smear, and large platelets were present.   Patient has a hx of HIV and chronic thrombocytopenia. His platelets account ranges at 20's for past few years, and PLT of 21 is actually at his baseline.   Results for GILAD, DUGGER (MRN [id number]) as of 04/25/2013 01:49  Ref. Range 04/02/2008 20:27 09/12/2008 23:19 04/03/2009 20:16 06/16/2010 10:35 07/07/2011 09:18 12/31/2011 11:21 06/28/2012 11:38 10/11/2012 10:21 04/24/2013 09:18  Platelets Latest Range: 150-400 K/uL 27 (LL) 26 (LL) 26 (LL) 24 (LL) 27 (LL) 19 (LL) 26 (LL) 26 (LL) 21 (LL)       Assessment/ Plan:   I placed a call to patient's home and left a message for him to page me tonight if possible. I will also send a inbasket note to ID Dr. Megan Salon about this lab result.      Charlann Lange, MD   04/25/2013, 1:53 AM

## 2013-05-09 ENCOUNTER — Ambulatory Visit (INDEPENDENT_AMBULATORY_CARE_PROVIDER_SITE_OTHER): Payer: Self-pay | Admitting: Internal Medicine

## 2013-05-09 ENCOUNTER — Encounter: Payer: Self-pay | Admitting: Internal Medicine

## 2013-05-09 VITALS — BP 143/95 | HR 87 | Temp 98.5°F | Ht 74.0 in | Wt 216.5 lb

## 2013-05-09 DIAGNOSIS — B20 Human immunodeficiency virus [HIV] disease: Secondary | ICD-10-CM

## 2013-05-09 MED ORDER — DARUNAVIR-COBICISTAT 800-150 MG PO TABS: 1.0000 | ORAL_TABLET | Freq: Every day | ORAL | Status: DC

## 2013-05-09 NOTE — Progress Notes (Signed)
Patient ID: Robert Ware, male   DOB: Jul 04, 1965, 48 y.o.   MRN: [id number] @LOGODEPT         Patient Active Problem List   Diagnosis Date Noted  . Headache 12/07/2012  . SEBORRHEA 04/17/2008  . HIV DISEASE 02/06/2006  . WARTS, OTHER SPECIFIED VIRAL 02/06/2006  . THROMBOCYTOPENIA 02/06/2006  . ERECTILE DYSFUNCTION 02/06/2006  . GERD 02/06/2006    Patient's Medications  New Prescriptions   DARUNAVIR-COBICISTAT (PREZCOBIX) 800-150 MG PER TABLET    Take 1 tablet by mouth daily. Swallow whole. Do NOT crush, break or chew tablets. Take with food.  Previous Medications   DOLUTEGRAVIR (TIVICAY) 50 MG TABLET    Take 1 tablet (50 mg total) by mouth daily.   EMTRICITABINE-TENOFOVIR (TRUVADA) 200-300 MG PER TABLET    Take 1 tablet by mouth daily.   MULTIPLE VITAMINS-MINERALS (MULTIVITAMIN PO)    Take by mouth.  Modified Medications   No medications on file  Discontinued Medications   DARUNAVIR ETHANOLATE (PREZISTA) 800 MG TABLET    Take 1 tablet (800 mg total) by mouth daily with breakfast.   RITONAVIR (NORVIR) 100 MG TABS TABLET    Take 1 tablet (100 mg total) by mouth daily.    Subjective: Robert Ware is in for his routine visit. As usual he has never missed any doses of his HIV medications. He is doing well without complaints.  Review of Systems: Pertinent items are noted in HPI.  No past medical history on file.  History  Substance Use Topics  . Smoking status: Never Smoker   . Smokeless tobacco: Never Used  . Alcohol Use: No     Comment: occassionaly    Family History  Problem Relation Age of Onset  . Stroke Father   . Diabetes Mother   . Hypertension Mother     No Known Allergies  Objective: Temp: 98.5 F (36.9 C) (04/14 1013) Temp src: Oral (04/14 1013) BP: 143/95 mmHg (04/14 1013) Pulse Rate: 87 (04/14 1013) Body mass index is 27.79 kg/(m^2).  General: He is smiling and in good spirits Oral: No oropharyngeal lesions Skin: No rash Lungs: Clear Cor:  Regular S1 and S2 no murmurs  Lab Results Lab Results  Component Value Date   WBC 3.2* 04/24/2013   HGB 16.1 04/24/2013   HCT 45.3 04/24/2013   MCV 78.5 04/24/2013   PLT 21* 04/24/2013    Lab Results  Component Value Date   CREATININE 0.98 04/24/2013   BUN 11 04/24/2013   NA 137 04/24/2013   K 3.9 04/24/2013   CL 105 04/24/2013   CO2 24 04/24/2013    Lab Results  Component Value Date   ALT 36 04/24/2013   AST 45* 04/24/2013   ALKPHOS 49 04/24/2013   BILITOT 1.0 04/24/2013    Lab Results  Component Value Date   CHOL 143 04/24/2013   HDL 56 04/24/2013   LDLCALC 78 04/24/2013   TRIG 47 04/24/2013   CHOLHDL 2.6 04/24/2013    Lab Results HIV 1 RNA Quant (copies/mL)  Date Value  04/24/2013 <20   10/11/2012 <20   06/28/2012 <20      CD4 T Cell Abs (/uL)  Date Value  04/24/2013 260*  10/11/2012 290*  06/28/2012 240*     Assessment: His infection remains under very good control. He is having some slow CD4 reconstitution over the past 5 years. I will simplify his regimen.  Plan: 1. Continue Truvada and Tivicay 2. Change Prezista and Norvir to Prezcobix 3. Follow up after  lab work in [place] months   Michel Bickers, MD [facility] for [place] [phone] pager   [phone] cell 05/09/2013, 10:43 AM

## 2013-06-24 ENCOUNTER — Other Ambulatory Visit: Payer: Self-pay | Admitting: Internal Medicine

## 2013-06-24 DIAGNOSIS — B20 Human immunodeficiency virus [HIV] disease: Secondary | ICD-10-CM

## 2013-10-23 ENCOUNTER — Other Ambulatory Visit: Payer: Self-pay | Admitting: *Deleted

## 2013-10-23 DIAGNOSIS — B20 Human immunodeficiency virus [HIV] disease: Secondary | ICD-10-CM

## 2013-10-23 MED ORDER — EMTRICITABINE-TENOFOVIR DF 200-300 MG PO TABS: ORAL_TABLET | ORAL | Status: DC

## 2013-10-23 MED ORDER — DARUNAVIR-COBICISTAT 800-150 MG PO TABS: 1.0000 | ORAL_TABLET | Freq: Every day | ORAL | Status: DC

## 2013-10-23 MED ORDER — DOLUTEGRAVIR SODIUM 50 MG PO TABS: ORAL_TABLET | ORAL | Status: DC

## 2013-10-23 NOTE — Telephone Encounter (Signed)
ADAP Application 

## 2013-11-08 ENCOUNTER — Other Ambulatory Visit (INDEPENDENT_AMBULATORY_CARE_PROVIDER_SITE_OTHER): Payer: Self-pay

## 2013-11-08 DIAGNOSIS — B20 Human immunodeficiency virus [HIV] disease: Secondary | ICD-10-CM

## 2013-11-08 LAB — COMPREHENSIVE METABOLIC PANEL
ALT: 50 U/L (ref 0–53)
AST: 69 U/L — AB (ref 0–37)
Albumin: 3.5 g/dL (ref 3.5–5.2)
Alkaline Phosphatase: 52 U/L (ref 39–117)
BILIRUBIN TOTAL: 0.9 mg/dL (ref 0.2–1.2)
BUN: 13 mg/dL (ref 6–23)
CO2: 24 mEq/L (ref 19–32)
Calcium: 8.6 mg/dL (ref 8.4–10.5)
Chloride: 107 mEq/L (ref 96–112)
Creat: 1.02 mg/dL (ref 0.50–1.35)
Glucose, Bld: 85 mg/dL (ref 70–99)
Potassium: 4.3 mEq/L (ref 3.5–5.3)
SODIUM: 138 meq/L (ref 135–145)
TOTAL PROTEIN: 6.9 g/dL (ref 6.0–8.3)

## 2013-11-09 LAB — T-HELPER CELL (CD4) - (RCID CLINIC ONLY)
CD4 % Helper T Cell: 31 % — ABNORMAL LOW (ref 33–55)
CD4 T CELL ABS: 280 /uL — AB (ref 400–2700)

## 2013-11-09 LAB — HIV-1 RNA QUANT-NO REFLEX-BLD

## 2013-11-10 ENCOUNTER — Other Ambulatory Visit: Payer: Self-pay

## 2013-11-28 ENCOUNTER — Encounter: Payer: Self-pay | Admitting: Internal Medicine

## 2013-11-28 ENCOUNTER — Ambulatory Visit (INDEPENDENT_AMBULATORY_CARE_PROVIDER_SITE_OTHER): Payer: Self-pay | Admitting: Internal Medicine

## 2013-11-28 DIAGNOSIS — B20 Human immunodeficiency virus [HIV] disease: Secondary | ICD-10-CM

## 2013-11-28 NOTE — Progress Notes (Signed)
Patient ID: Robert Ware, male   DOB: [birth year], 48 y.o.   MRN: [id number]          Patient Active Problem List   Diagnosis Date Noted  . Headache 12/07/2012  . SEBORRHEA 04/17/2008  . Human immunodeficiency virus (HIV) disease 02/06/2006  . WARTS, OTHER SPECIFIED VIRAL 02/06/2006  . THROMBOCYTOPENIA 02/06/2006  . ERECTILE DYSFUNCTION 02/06/2006  . GERD 02/06/2006    Patient's Medications  New Prescriptions   No medications on file  Previous Medications   DARUNAVIR-COBICISTAT (PREZCOBIX) 800-150 MG PER TABLET    Take 1 tablet by mouth daily. Swallow whole. Do NOT crush, break or chew tablets. Take with food.   DOLUTEGRAVIR (TIVICAY) 50 MG TABLET    TAKE 1 TABLET BY MOUTH EVERY DAY   EMTRICITABINE-TENOFOVIR (TRUVADA) 200-300 MG PER TABLET    TAKE 1 TABLET BY MOUTH EVERY DAY   MULTIPLE VITAMINS-MINERALS (MULTIVITAMIN PO)    Take by mouth.  Modified Medications   No medications on file  Discontinued Medications   No medications on file    Subjective: Robert Ware is in for his routine visit. He knows the names of all of his medications and describes taking them correctly. He takes all 3 about 11 AM with a meal. He has not missed any doses. He is feeling well. Review of Systems: Pertinent items are noted in HPI.  No past medical history on file.  History  Substance Use Topics  . Smoking status: Never Smoker   . Smokeless tobacco: Never Used  . Alcohol Use: No     Comment: occassionaly    Family History  Problem Relation Age of Onset  . Stroke Father   . Diabetes Mother   . Hypertension Mother     No Known Allergies  Objective: Temp: 98.2 F (36.8 C) (11/03 0859) Temp Source: Oral (11/03 0859) BP: 141/95 mmHg (11/03 0905) Pulse Rate: 75 (11/03 0905) Body mass index is 28.2 kg/(m^2).  General: he is in good spirits as usual Oral: no oropharyngeal lesions Skin: no rash Lungs: clear Cor: regular S1 and S2 with no murmurs  Lab Results Lab Results    Component Value Date   WBC 3.2* 04/24/2013   HGB 16.1 04/24/2013   HCT 45.3 04/24/2013   MCV 78.5 04/24/2013   PLT 21* 04/24/2013    Lab Results  Component Value Date   CREATININE 1.02 11/08/2013   BUN 13 11/08/2013   NA 138 11/08/2013   K 4.3 11/08/2013   CL 107 11/08/2013   CO2 24 11/08/2013    Lab Results  Component Value Date   ALT 50 11/08/2013   AST 69* 11/08/2013   ALKPHOS 52 11/08/2013   BILITOT 0.9 11/08/2013    Lab Results  Component Value Date   CHOL 143 04/24/2013   HDL 56 04/24/2013   LDLCALC 78 04/24/2013   TRIG 47 04/24/2013   CHOLHDL 2.6 04/24/2013    Lab Results HIV 1 RNA QUANT (copies/mL)  Date Value  11/08/2013 <20  04/24/2013 <20  10/11/2012 <20   CD4 T CELL ABS (/uL)  Date Value  11/08/2013 280*  04/24/2013 260*  10/11/2012 290*     Assessment: His HIV infection remains under excellent control. I will continue his current antiretroviral regimen.  He has some elevation of his blood pressure today but has not had a pattern to suggest established hypertension.  Plan: 1. Continue current antiretroviral regimen 2. Follow-up after lab work in [place] months   Michel Bickers, [place] for  Infectious Disease [facility] [phone] pager   [phone] cell 11/28/2013, 9:13 AM

## 2013-12-13 ENCOUNTER — Other Ambulatory Visit: Payer: Self-pay | Admitting: Internal Medicine

## 2013-12-13 DIAGNOSIS — B20 Human immunodeficiency virus [HIV] disease: Secondary | ICD-10-CM

## 2014-03-20 ENCOUNTER — Ambulatory Visit: Payer: Self-pay

## 2014-03-23 ENCOUNTER — Other Ambulatory Visit: Payer: Self-pay | Admitting: *Deleted

## 2014-03-23 DIAGNOSIS — B20 Human immunodeficiency virus [HIV] disease: Secondary | ICD-10-CM

## 2014-03-23 MED ORDER — DARUNAVIR-COBICISTAT 800-150 MG PO TABS
1.0000 | ORAL_TABLET | Freq: Every day | ORAL | Status: DC
Start: 2014-03-23 — End: 2014-06-14

## 2014-03-23 MED ORDER — DOLUTEGRAVIR SODIUM 50 MG PO TABS: 50.0000 mg | ORAL_TABLET | Freq: Every day | ORAL | Status: DC

## 2014-03-23 MED ORDER — EMTRICITABINE-TENOFOVIR DF 200-300 MG PO TABS: 1.0000 | ORAL_TABLET | Freq: Every day | ORAL | Status: DC

## 2014-03-23 NOTE — Telephone Encounter (Signed)
ADAP Application 

## 2014-04-16 ENCOUNTER — Ambulatory Visit: Payer: Self-pay

## 2014-04-16 ENCOUNTER — Ambulatory Visit: Payer: Self-pay | Admitting: Internal Medicine

## 2014-04-24 ENCOUNTER — Telehealth: Payer: Self-pay | Admitting: Internal Medicine

## 2014-04-24 ENCOUNTER — Encounter: Payer: Self-pay | Admitting: Internal Medicine

## 2014-04-24 ENCOUNTER — Ambulatory Visit: Payer: Self-pay | Attending: Internal Medicine | Admitting: Internal Medicine

## 2014-04-24 VITALS — BP 131/88 | HR 78 | Temp 98.0°F | Resp 16 | Wt 230.4 lb

## 2014-04-24 DIAGNOSIS — Z139 Encounter for screening, unspecified: Secondary | ICD-10-CM

## 2014-04-24 DIAGNOSIS — Z0000 Encounter for general adult medical examination without abnormal findings: Secondary | ICD-10-CM | POA: Insufficient documentation

## 2014-04-24 DIAGNOSIS — B079 Viral wart, unspecified: Secondary | ICD-10-CM

## 2014-04-24 DIAGNOSIS — B20 Human immunodeficiency virus [HIV] disease: Secondary | ICD-10-CM

## 2014-04-24 DIAGNOSIS — R0981 Nasal congestion: Secondary | ICD-10-CM

## 2014-04-24 DIAGNOSIS — Z8619 Personal history of other infectious and parasitic diseases: Secondary | ICD-10-CM

## 2014-04-24 LAB — CBC WITH DIFFERENTIAL/PLATELET
Basophils Absolute: 0 10*3/uL (ref 0.0–0.1)
Basophils Relative: 0 % (ref 0–1)
Eosinophils Absolute: 0.1 10*3/uL (ref 0.0–0.7)
Eosinophils Relative: 3 % (ref 0–5)
HCT: 43.5 % (ref 39.0–52.0)
HEMOGLOBIN: 15 g/dL (ref 13.0–17.0)
LYMPHS PCT: 29 % (ref 12–46)
Lymphs Abs: 0.9 10*3/uL (ref 0.7–4.0)
MCH: 26.7 pg (ref 26.0–34.0)
MCHC: 34.5 g/dL (ref 30.0–36.0)
MCV: 77.4 fL — ABNORMAL LOW (ref 78.0–100.0)
MONO ABS: 0.2 10*3/uL (ref 0.1–1.0)
Monocytes Relative: 8 % (ref 3–12)
NEUTROS ABS: 1.8 10*3/uL (ref 1.7–7.7)
Neutrophils Relative %: 60 % (ref 43–77)
Platelets: 25 10*3/uL — CL (ref 150–400)
RBC: 5.62 MIL/uL (ref 4.22–5.81)
RDW: 20.7 % — AB (ref 11.5–15.5)
WBC: 3 10*3/uL — AB (ref 4.0–10.5)

## 2014-04-24 LAB — COMPLETE METABOLIC PANEL WITH GFR
ALT: 62 U/L — AB (ref 0–53)
AST: 108 U/L — ABNORMAL HIGH (ref 0–37)
Albumin: 3.7 g/dL (ref 3.5–5.2)
Alkaline Phosphatase: 50 U/L (ref 39–117)
BUN: 12 mg/dL (ref 6–23)
CALCIUM: 9 mg/dL (ref 8.4–10.5)
CO2: 26 mEq/L (ref 19–32)
CREATININE: 0.96 mg/dL (ref 0.50–1.35)
Chloride: 106 mEq/L (ref 96–112)
GFR, Est African American: >89 mL/min
GLUCOSE: 123 mg/dL — AB (ref 70–99)
POTASSIUM: 4 meq/L (ref 3.5–5.3)
Sodium: 139 mEq/L (ref 135–145)
TOTAL PROTEIN: 7.1 g/dL (ref 6.0–8.3)
Total Bilirubin: 1.2 mg/dL (ref 0.2–1.2)

## 2014-04-24 LAB — HEMOGLOBIN A1C
Hgb A1c MFr Bld: 5.5 % (ref <5.7)
Mean Plasma Glucose: 111 mg/dL (ref <117)

## 2014-04-24 LAB — TSH: TSH: 1.132 u[IU]/mL (ref 0.350–4.500)

## 2014-04-24 MED ORDER — SALINE SPRAY 0.65 % NA SOLN: 1.0000 | NASAL | Status: DC | PRN

## 2014-04-24 NOTE — Telephone Encounter (Signed)
Received call from after hours nurse alerting me that patient had a critical lab. Patients platelet count was 25 after blood draw on today's office visit. Review of the chart reveals that patient has a history of HIV and that is his baseline platelet count. Review of office visit does not mention patient complaints of bleeding. I will alert patients PCP and have him to call patient with further directions.

## 2014-04-24 NOTE — Progress Notes (Signed)
Patient here to establish care Complains of pain in his esophagus for the past two months Having some dry skin in his groin area that is itchy at times Currently taking no prescribed medications

## 2014-04-24 NOTE — Progress Notes (Signed)
Patient Demographics  Robert Ware, is a 49 y.o. male  NTI:[id number]  QQP:[id number]  DOB - 01-Dec-1965  CC:  Chief Complaint  Patient presents with  . Establish Care       HPI: Robert Ware is a 49 y.o. male here today to establish medical care.history of HIV for several years currently being followed up by infectious disease, patient has history of warts in the groin area and as per patient got always a slight surgery done in the past but currently has recurrence and is requesting to see a specialist, he denies any urinary symptoms. Also reported to have lot of allergy symptoms nasal congestion postnasal drip and occasional scratchy throat, denies any reflux symptoms denies any nausea vomiting. Patient has No headache, No chest pain, No abdominal pain - No Nausea, No new weakness tingling or numbness, No Cough - SOB.  No Known Allergies History reviewed. No pertinent past medical history. Current Outpatient Prescriptions on File Prior to Visit  Medication Sig Dispense Refill  . darunavir-cobicistat (PREZCOBIX) 800-150 MG per tablet Take 1 tablet by mouth daily. Swallow whole. Do NOT crush, break or chew tablets. Take with food. 30 tablet 5  . dolutegravir (TIVICAY) 50 MG tablet Take 1 tablet (50 mg total) by mouth daily. 30 tablet 6  . emtricitabine-tenofovir (TRUVADA) 200-300 MG per tablet Take 1 tablet by mouth daily. 30 tablet 6  . Multiple Vitamins-Minerals (MULTIVITAMIN PO) Take by mouth.     No current facility-administered medications on file prior to visit.   Family History  Problem Relation Age of Onset  . Stroke Father   . Diabetes Mother   . Hypertension Mother    History   Social History  . Marital Status: Married    Spouse Name: N/A  . Number of Children: N/A  . Years of Education: N/A   Occupational History  . Not on file.   Social History Main Topics  . Smoking status: Never Smoker   . Smokeless tobacco: Never Used  . Alcohol Use: 0.0  oz/week    0 Standard drinks or equivalent per week     Comment: occassionaly  . Drug Use: No  . Sexual Activity: Not Currently     Comment: declined condoms   Other Topics Concern  . Not on file   Social History Narrative    Review of Systems: Constitutional: Negative for fever, chills, diaphoresis, activity change, appetite change and fatigue. HENT: Negative for ear pain, nosebleeds, congestion, facial swelling, rhinorrhea, neck pain, neck stiffness and ear discharge.  Eyes: Negative for pain, discharge, redness, itching and visual disturbance. Respiratory: Negative for cough, choking, chest tightness, shortness of breath, wheezing and stridor.  Cardiovascular: Negative for chest pain, palpitations and leg swelling. Gastrointestinal: Negative for abdominal distention. Genitourinary: Negative for dysuria, urgency, frequency, hematuria, flank pain, decreased urine volume, difficulty urinating and dyspareunia.  Musculoskeletal: Negative for back pain, joint swelling, arthralgia and gait problem. Neurological: Negative for dizziness, tremors, seizures, syncope, facial asymmetry, speech difficulty, weakness, light-headedness, numbness and headaches.  Hematological: Negative for adenopathy. Does not bruise/bleed easily. Psychiatric/Behavioral: Negative for hallucinations, behavioral problems, confusion, dysphoric mood, decreased concentration and agitation.    Objective:   Filed Vitals:   04/24/14 1458  BP: 131/88  Pulse: 78  Temp: 98 F (36.7 C)  Resp: 16    Physical Exam: Constitutional: Patient appears well-developed and well-nourished. No distress. HENT: Normocephalic, atraumatic, External right and left ear normal. Oropharynx is clear and moist. Nasal congestion no sinus tenderness  Eyes: Conjunctivae and EOM are normal. PERRLA, no scleral icterus. Neck: Normal ROM. Neck supple. No JVD. No tracheal deviation. No thyromegaly. CVS: RRR, S1/S2 +, no murmurs, no gallops, no  carotid bruit.  Pulmonary: Effort and breath sounds normal, no stridor, rhonchi, wheezes, rales.  Abdominal: Soft. BS +, no distension, tenderness, rebound or guarding.  Musculoskeletal: Normal range of motion. No edema and no tenderness.  Neuro: Alert. Normal reflexes, muscle tone coordination. No cranial nerve deficit. Skin: Skin has verrucous warts in the groin area Psychiatric: Normal mood and affect. Behavior, judgment, thought content normal.  Lab Results  Component Value Date   WBC 3.2* 04/24/2013   HGB 16.1 04/24/2013   HCT 45.3 04/24/2013   MCV 78.5 04/24/2013   PLT 21* 04/24/2013   Lab Results  Component Value Date   CREATININE 1.02 11/08/2013   BUN 13 11/08/2013   NA 138 11/08/2013   K 4.3 11/08/2013   CL 107 11/08/2013   CO2 24 11/08/2013    No results found for: HGBA1C Lipid Panel     Component Value Date/Time   CHOL 143 04/24/2013 0918   TRIG 47 04/24/2013 0918   HDL 56 04/24/2013 0918   CHOLHDL 2.6 04/24/2013 0918   VLDL 9 04/24/2013 0918   LDLCALC 78 04/24/2013 0918       Assessment and plan:   1. History of HIV infection Currently on HAART  Therapy and is being followed up by infectious disease.  2. Wart  - Ambulatory referral to Dermatology  3. Nasal congestion  - sodium chloride (OCEAN) 0.65 % SOLN nasal spray; Place 1 spray into both nostrils as needed for congestion.  Dispense: 15 mL; Refill: 3  4. Screening Ordered baseline blood work  - COMPLETE METABOLIC PANEL WITH GFR - Vit D  25 hydroxy (rtn osteoporosis monitoring) - Hemoglobin A1c - CBC with Differential/Platelet - TSH   Health Maintenance  -Vaccinations:  Up-to-date with her Pneumovax  Return in about 3 months (around 07/25/2014), or if symptoms worsen or fail to improve.    The patient was given clear instructions to go to ER or return to medical center if symptoms don't improve, worsen or new problems develop. The patient verbalized understanding. The patient was  told to call to get lab results if they haven't heard anything in the next week.    This note has been created with Surveyor, quantity. Any transcriptional errors are unintentional.   Lorayne Marek, MD

## 2014-04-25 LAB — VITAMIN D 25 HYDROXY (VIT D DEFICIENCY, FRACTURES): Vit D, 25-Hydroxy: 22 ng/mL — ABNORMAL LOW (ref 30–100)

## 2014-05-18 ENCOUNTER — Other Ambulatory Visit: Payer: Self-pay | Admitting: Internal Medicine

## 2014-05-29 ENCOUNTER — Other Ambulatory Visit (INDEPENDENT_AMBULATORY_CARE_PROVIDER_SITE_OTHER): Payer: Self-pay

## 2014-05-29 DIAGNOSIS — B20 Human immunodeficiency virus [HIV] disease: Secondary | ICD-10-CM

## 2014-05-29 DIAGNOSIS — Z79899 Other long term (current) drug therapy: Secondary | ICD-10-CM

## 2014-05-29 DIAGNOSIS — Z113 Encounter for screening for infections with a predominantly sexual mode of transmission: Secondary | ICD-10-CM

## 2014-05-29 LAB — COMPREHENSIVE METABOLIC PANEL
ALK PHOS: 47 U/L (ref 39–117)
ALT: 49 U/L (ref 0–53)
AST: 58 U/L — AB (ref 0–37)
Albumin: 3.7 g/dL (ref 3.5–5.2)
BILIRUBIN TOTAL: 1.1 mg/dL (ref 0.2–1.2)
BUN: 11 mg/dL (ref 6–23)
CO2: 22 meq/L (ref 19–32)
CREATININE: 0.99 mg/dL (ref 0.50–1.35)
Calcium: 8.9 mg/dL (ref 8.4–10.5)
Chloride: 106 mEq/L (ref 96–112)
GLUCOSE: 79 mg/dL (ref 70–99)
POTASSIUM: 4.5 meq/L (ref 3.5–5.3)
SODIUM: 137 meq/L (ref 135–145)
Total Protein: 7.2 g/dL (ref 6.0–8.3)

## 2014-05-29 LAB — LIPID PANEL
Cholesterol: 156 mg/dL (ref 0–200)
HDL: 62 mg/dL (ref >=40)
LDL Cholesterol: 85 mg/dL (ref 0–99)
Total CHOL/HDL Ratio: 2.5 Ratio
Triglycerides: 45 mg/dL (ref <150)
VLDL: 9 mg/dL (ref 0–40)

## 2014-05-30 LAB — CBC
HCT: 45.5 % (ref 39.0–52.0)
Hemoglobin: 15.6 g/dL (ref 13.0–17.0)
MCH: 26.5 pg (ref 26.0–34.0)
MCHC: 34.3 g/dL (ref 30.0–36.0)
MCV: 77.2 fL — ABNORMAL LOW (ref 78.0–100.0)
Platelets: 25 10*3/uL — CL (ref 150–400)
RBC: 5.89 MIL/uL — ABNORMAL HIGH (ref 4.22–5.81)
RDW: 20.6 % — ABNORMAL HIGH (ref 11.5–15.5)
WBC: 2.8 10*3/uL — ABNORMAL LOW (ref 4.0–10.5)

## 2014-05-30 LAB — HIV-1 RNA QUANT-NO REFLEX-BLD
HIV 1 RNA Quant: <20 copies/mL (ref <20)
HIV-1 RNA Quant, Log: <1.3 {Log} (ref <1.30)

## 2014-05-30 LAB — RPR

## 2014-05-30 LAB — T-HELPER CELL (CD4) - (RCID CLINIC ONLY)
CD4 % Helper T Cell: 31 % — ABNORMAL LOW (ref 33–55)
CD4 T Cell Abs: 840 /uL (ref 400–2700)

## 2014-06-14 ENCOUNTER — Ambulatory Visit (INDEPENDENT_AMBULATORY_CARE_PROVIDER_SITE_OTHER): Payer: Self-pay | Admitting: Internal Medicine

## 2014-06-14 ENCOUNTER — Encounter: Payer: Self-pay | Admitting: Internal Medicine

## 2014-06-14 VITALS — BP 118/83 | HR 65 | Temp 98.4°F | Wt 223.8 lb

## 2014-06-14 DIAGNOSIS — B20 Human immunodeficiency virus [HIV] disease: Secondary | ICD-10-CM

## 2014-06-14 DIAGNOSIS — R748 Abnormal levels of other serum enzymes: Secondary | ICD-10-CM

## 2014-06-14 MED ORDER — ELVITEG-COBIC-EMTRICIT-TENOFAF 150-150-200-10 MG PO TABS: 1.0000 | ORAL_TABLET | Freq: Every day | ORAL | Status: DC

## 2014-06-14 MED ORDER — DARUNAVIR ETHANOLATE 800 MG PO TABS: 800.0000 mg | ORAL_TABLET | Freq: Every day | ORAL | Status: DC

## 2014-06-14 NOTE — Progress Notes (Signed)
Patient ID: Robert Ware, male   DOB: 07/07/1965, 49 y.o.   MRN: [id number]          Patient Active Problem List   Diagnosis Date Noted  . Human immunodeficiency virus (HIV) disease 02/06/2006    Priority: High  . Elevated liver enzymes 06/14/2014  . Headache 12/07/2012  . SEBORRHEA 04/17/2008  . WARTS, OTHER SPECIFIED VIRAL 02/06/2006  . THROMBOCYTOPENIA 02/06/2006  . ERECTILE DYSFUNCTION 02/06/2006  . GERD 02/06/2006    Patient's Medications  New Prescriptions   DARUNAVIR ETHANOLATE (PREZISTA) 800 MG TABLET    Take 1 tablet (800 mg total) by mouth daily with breakfast.   ELVITEGRAVIR-COBICISTAT-EMTRICITABINE-TENOFOVIR (GENVOYA) 150-150-200-10 MG TABS TABLET    Take 1 tablet by mouth daily with breakfast.  Previous Medications   MULTIPLE VITAMINS-MINERALS (MULTIVITAMIN PO)    Take by mouth.   SODIUM CHLORIDE (OCEAN) 0.65 % SOLN NASAL SPRAY    Place 1 spray into both nostrils as needed for congestion.  Modified Medications   No medications on file  Discontinued Medications   DARUNAVIR-COBICISTAT (PREZCOBIX) 800-150 MG PER TABLET    Take 1 tablet by mouth daily. Swallow whole. Do NOT crush, break or chew tablets. Take with food.   DOLUTEGRAVIR (TIVICAY) 50 MG TABLET    Take 1 tablet (50 mg total) by mouth daily.   EMTRICITABINE-TENOFOVIR (TRUVADA) 200-300 MG PER TABLET    Take 1 tablet by mouth daily.   PREZCOBIX 800-150 MG PER TABLET    TAKE 1 TABLET BY MOUTH DAILY. SWALLOW WHOLE. DO NOT CRUSH, BREAK OR CHEW TABLETS. TAKE WITH FOOD    Subjective: Robert Ware is in for his routine HIV follow-up visit. As usual he never misses any doses of his Truvada, Tivicay or Prezcobix. He is not taking any other medications at this time. He states that he is feeling well. He's not had any problems with excessive bleeding.  Review of Systems: Constitutional: negative Eyes: negative Ears, nose, mouth, throat, and face: negative Respiratory: negative Cardiovascular:  negative Gastrointestinal: negative Genitourinary:negative  No past medical history on file.  History  Substance Use Topics  . Smoking status: Never Smoker   . Smokeless tobacco: Never Used  . Alcohol Use: 0.0 oz/week    0 Standard drinks or equivalent per week     Comment: occassionaly    Family History  Problem Relation Age of Onset  . Stroke Father   . Diabetes Mother   . Hypertension Mother     No Known Allergies  Objective: Temp: 98.4 F (36.9 C) (05/19 0913) Temp Source: Oral (05/19 0913) BP: 118/83 mmHg (05/19 0913) Pulse Rate: 65 (05/19 0913) Body mass index is 28.72 kg/(m^2).  General: His weight is stable at 223 pounds Oral: No oropharyngeal lesions. His teeth are in good condition Skin: No rash Lungs: Clear Cor: Regular S1 and S2 with no murmurs Abdomen: Soft and nontender Mood: He is smiling and in good spirits as usual  Lab Results Lab Results  Component Value Date   WBC 2.8* 05/29/2014   HGB 15.6 05/29/2014   HCT 45.5 05/29/2014   MCV 77.2* 05/29/2014   PLT 25* 05/29/2014    Lab Results  Component Value Date   CREATININE 0.99 05/29/2014   BUN 11 05/29/2014   NA 137 05/29/2014   K 4.5 05/29/2014   CL 106 05/29/2014   CO2 22 05/29/2014    Lab Results  Component Value Date   ALT 49 05/29/2014   AST 58* 05/29/2014   ALKPHOS 47 05/29/2014  BILITOT 1.1 05/29/2014    Lab Results  Component Value Date   CHOL 156 05/29/2014   HDL 62 05/29/2014   LDLCALC 85 05/29/2014   TRIG 45 05/29/2014   CHOLHDL 2.5 05/29/2014    Lab Results HIV 1 RNA QUANT (copies/mL)  Date Value  05/29/2014 <20  11/08/2013 <20  04/24/2013 <20   CD4 T CELL ABS (/uL)  Date Value  05/29/2014 840  11/08/2013 280*  04/24/2013 260*     Assessment: His HIV infection remains under excellent control. I will simplify his antiretroviral regimen to Genv[place] and Prezista.  The cause of his chronic thrombocytopenia is unknown. I believe that he has been screened  and found to be negative for hepatitis B and C in the past but I cannot locate those results. I will repeat hepatitis B and C serologies before his next visit. He does have some chronic elevation of his AST.   Plan: Simplify his antiretroviral regimen to [place] and Prezista Follow-up after lab work in [place] months  Michel Bickers, MD [facility] [place] [phone] pager   [phone] cell 06/14/2014, 10:13 AM

## 2014-06-29 ENCOUNTER — Other Ambulatory Visit: Payer: Self-pay | Admitting: Dermatology

## 2014-08-14 ENCOUNTER — Ambulatory Visit: Payer: Self-pay | Attending: Internal Medicine | Admitting: Internal Medicine

## 2014-08-14 ENCOUNTER — Encounter: Payer: Self-pay | Admitting: Internal Medicine

## 2014-08-14 VITALS — BP 154/88 | HR 93 | Temp 98.0°F | Resp 16 | Wt 225.2 lb

## 2014-08-14 DIAGNOSIS — R945 Abnormal results of liver function studies: Secondary | ICD-10-CM

## 2014-08-14 DIAGNOSIS — R7989 Other specified abnormal findings of blood chemistry: Secondary | ICD-10-CM | POA: Insufficient documentation

## 2014-08-14 DIAGNOSIS — E559 Vitamin D deficiency, unspecified: Secondary | ICD-10-CM | POA: Insufficient documentation

## 2014-08-14 DIAGNOSIS — B20 Human immunodeficiency virus [HIV] disease: Secondary | ICD-10-CM

## 2014-08-14 DIAGNOSIS — R030 Elevated blood-pressure reading, without diagnosis of hypertension: Secondary | ICD-10-CM | POA: Insufficient documentation

## 2014-08-14 DIAGNOSIS — IMO0001 Reserved for inherently not codable concepts without codable children: Secondary | ICD-10-CM

## 2014-08-14 DIAGNOSIS — Z8619 Personal history of other infectious and parasitic diseases: Secondary | ICD-10-CM | POA: Insufficient documentation

## 2014-08-14 MED ORDER — VITAMIN D (ERGOCALCIFEROL) 1.25 MG (50000 UNIT) PO CAPS: 50000.0000 [IU] | ORAL_CAPSULE | ORAL | Status: DC

## 2014-08-14 NOTE — Progress Notes (Signed)
MRN: [id number] Name: Robert Ware  Sex: male Age: 49 y.o. DOB: Dec 31, 1965  Allergies: Review of patient's allergies indicates no known allergies.  Chief Complaint  Patient presents with  . Annual Exam    HPI: Patient is 49 y.o. male who has history of HIV,, thrombocytopenia, currently following up with her ID on HIV meds, previous blood work reviewed with the patient noticed vitamin D deficiency, also noticed to have abnormal liver enzyme elevation, patient already has blood work ordered by his ID specialist to evaluate for hepatitis, currently denies any acute symptoms, his blood pressure is borderline elevated, patient does report family history of hypertension, denies any headache dizziness chest and shortness of breath.  History reviewed. No pertinent past medical history.  History reviewed. No pertinent past surgical history.    Medication List       This list is accurate as of: 08/14/14 11:05 AM.  Always use your most recent med list.               Darunavir Ethanolate 800 MG tablet  Commonly known as:  PREZISTA  Take 1 tablet (800 mg total) by mouth daily with breakfast.     elvitegravir-cobicistat-emtricitabine-tenofovir 150-150-200-10 MG Tabs tablet  Commonly known as:  GENVOYA  Take 1 tablet by mouth daily with breakfast.     MULTIVITAMIN PO  Take by mouth.     sodium chloride 0.65 % Soln nasal spray  Commonly known as:  OCEAN  Place 1 spray into both nostrils as needed for congestion.     Vitamin D (Ergocalciferol) 50000 UNITS Caps capsule  Commonly known as:  DRISDOL  Take 1 capsule (50,000 Units total) by mouth every 7 (seven) days.        Meds ordered this encounter  Medications  . Vitamin D, Ergocalciferol, (DRISDOL) 50000 UNITS CAPS capsule    Sig: Take 1 capsule (50,000 Units total) by mouth every 7 (seven) days.    Dispense:  12 capsule    Refill:  0    Immunization History  Administered Date(s) Administered  . H1N1 04/17/2008  .  Hepatitis B 04/03/1997, 05/29/1997, 08/23/2001  . Influenza Split 11/26/2010, 01/14/2012  . Influenza Whole 02/22/2006, 10/18/2007, 11/21/2009  . Influenza,inj,Quad PF,36+ Mos 10/25/2012  . Influenza-Unspecified 11/07/2013  . PPD Test 04/01/2000  . Pneumococcal Polysaccharide-23 03/03/2005, 07/08/2010    Family History  Problem Relation Age of Onset  . Stroke Father   . Diabetes Mother   . Hypertension Mother     History  Substance Use Topics  . Smoking status: Never Smoker   . Smokeless tobacco: Never Used  . Alcohol Use: 0.0 oz/week    0 Standard drinks or equivalent per week     Comment: occassionaly    Review of Systems   As noted in HPI  Filed Vitals:   08/14/14 1040  BP: 154/88  Pulse: 93  Temp: 98 F (36.7 C)  Resp: 16    Physical Exam  Physical Exam  Constitutional: No distress.  Eyes: EOM are normal. Pupils are equal, round, and reactive to light.  Cardiovascular: Normal rate and regular rhythm.   Pulmonary/Chest: Breath sounds normal. No respiratory distress. He has no wheezes. He has no rales.  Musculoskeletal: He exhibits no edema.    CBC    Component Value Date/Time   WBC 2.8* 05/29/2014 0904   RBC 5.89* 05/29/2014 0904   HGB 15.6 05/29/2014 0904   HCT 45.5 05/29/2014 0904   PLT 25* 05/29/2014 4098  MCV 77.2* 05/29/2014 0904   LYMPHSABS 0.9 04/24/2014 1544   MONOABS 0.2 04/24/2014 1544   EOSABS 0.1 04/24/2014 1544   BASOSABS 0.0 04/24/2014 1544    CMP     Component Value Date/Time   NA 137 05/29/2014 0904   K 4.5 05/29/2014 0904   CL 106 05/29/2014 0904   CO2 22 05/29/2014 0904   GLUCOSE 79 05/29/2014 0904   BUN 11 05/29/2014 0904   CREATININE 0.99 05/29/2014 0904   CREATININE 1.03 04/03/2009 2016   CALCIUM 8.9 05/29/2014 0904   PROT 7.2 05/29/2014 0904   ALBUMIN 3.7 05/29/2014 0904   AST 58* 05/29/2014 0904   ALT 49 05/29/2014 0904   ALKPHOS 47 05/29/2014 0904   BILITOT 1.1 05/29/2014 0904   GFRNONAA >89 04/24/2014 1544     GFRAA >89 04/24/2014 1544    Lab Results  Component Value Date/Time   CHOL 156 05/29/2014 09:04 AM    Lab Results  Component Value Date/Time   HGBA1C 5.5 04/24/2014 03:44 PM    Lab Results  Component Value Date/Time   AST 58* 05/29/2014 09:04 AM    Assessment and Plan  History of HIV infection Currently on HIV medication following up with ID  Vitamin D deficiency I prescribed patient vitamin D 50,000 unit dose to take once a week for 3 months, then we'll start taking vitamin D 2000 units daily  Elevated BP Advised patient for DASH diet, reevaluate on next visit if persistently elevated blood pressure consider starting on antihypertensives.  Abnormal LFTs Patient is scheduled for blood work for hepatitis panel ordered by his ID specialist.    Return in about 4 months (around 12/15/2014), or if symptoms worsen or fail to improve.   This note has been created with Surveyor, quantity. Any transcriptional errors are unintentional.    Lorayne Marek, MD

## 2014-08-14 NOTE — Patient Instructions (Signed)
DASH Eating Plan °DASH stands for "Dietary Approaches to Stop Hypertension." The DASH eating plan is a healthy eating plan that has been shown to reduce high blood pressure (hypertension). Additional health benefits may include reducing the risk of type 2 diabetes mellitus, heart disease, and stroke. The DASH eating plan may also help with weight loss. °WHAT DO I NEED TO KNOW ABOUT THE DASH EATING PLAN? °For the DASH eating plan, you will follow these general guidelines: °· Choose foods with a percent daily value for sodium of less than 5% (as listed on the food label). °· Use salt-free seasonings or herbs instead of table salt or sea salt. °· Check with your health care provider or pharmacist before using salt substitutes. °· Eat lower-sodium products, often labeled as "lower sodium" or "no salt added." °· Eat fresh foods. °· Eat more vegetables, fruits, and low-fat dairy products. °· Choose whole grains. Look for the word "whole" as the first word in the ingredient list. °· Choose fish and skinless chicken or turkey more often than red meat. Limit fish, poultry, and meat to 6 oz (170 g) each day. °· Limit sweets, desserts, sugars, and sugary drinks. °· Choose heart-healthy fats. °· Limit cheese to 1 oz (28 g) per day. °· Eat more home-cooked food and less restaurant, buffet, and fast food. °· Limit fried foods. °· Cook foods using methods other than frying. °· Limit canned vegetables. If you do use them, rinse them well to decrease the sodium. °· When eating at a restaurant, ask that your food be prepared with less salt, or no salt if possible. °WHAT FOODS CAN I EAT? °Seek help from a dietitian for individual calorie needs. °Grains °Whole grain or whole wheat bread. Brown rice. Whole grain or whole wheat pasta. Quinoa, bulgur, and whole grain cereals. Low-sodium cereals. Corn or whole wheat flour tortillas. Whole grain cornbread. Whole grain crackers. Low-sodium crackers. °Vegetables °Fresh or frozen vegetables  (raw, steamed, roasted, or grilled). Low-sodium or reduced-sodium tomato and vegetable juices. Low-sodium or reduced-sodium tomato sauce and paste. Low-sodium or reduced-sodium canned vegetables.  °Fruits °All fresh, canned (in natural juice), or frozen fruits. °Meat and Other Protein Products °Ground beef (85% or leaner), grass-fed beef, or beef trimmed of fat. Skinless chicken or turkey. Ground chicken or turkey. Pork trimmed of fat. All fish and seafood. Eggs. Dried beans, peas, or lentils. Unsalted nuts and seeds. Unsalted canned beans. °Dairy °Low-fat dairy products, such as skim or 1% milk, 2% or reduced-fat cheeses, low-fat ricotta or cottage cheese, or plain low-fat yogurt. Low-sodium or reduced-sodium cheeses. °Fats and Oils °Tub margarines without trans fats. Light or reduced-fat mayonnaise and salad dressings (reduced sodium). Avocado. Safflower, olive, or canola oils. Natural peanut or almond butter. °Other °Unsalted popcorn and pretzels. °The items listed above may not be a complete list of recommended foods or beverages. Contact your dietitian for more options. °WHAT FOODS ARE NOT RECOMMENDED? °Grains °White bread. White pasta. White rice. Refined cornbread. Bagels and croissants. Crackers that contain trans fat. °Vegetables °Creamed or fried vegetables. Vegetables in a cheese sauce. Regular canned vegetables. Regular canned tomato sauce and paste. Regular tomato and vegetable juices. °Fruits °Dried fruits. Canned fruit in light or heavy syrup. Fruit juice. °Meat and Other Protein Products °Fatty cuts of meat. Ribs, chicken wings, bacon, sausage, bologna, salami, chitterlings, fatback, hot dogs, bratwurst, and packaged luncheon meats. Salted nuts and seeds. Canned beans with salt. °Dairy °Whole or 2% milk, cream, half-and-half, and cream cheese. Whole-fat or sweetened yogurt. Full-fat   cheeses or blue cheese. Nondairy creamers and whipped toppings. Processed cheese, cheese spreads, or cheese  curds. °Condiments °Onion and garlic salt, seasoned salt, table salt, and sea salt. Canned and packaged gravies. Worcestershire sauce. Tartar sauce. Barbecue sauce. Teriyaki sauce. Soy sauce, including reduced sodium. Steak sauce. Fish sauce. Oyster sauce. Cocktail sauce. Horseradish. Ketchup and mustard. Meat flavorings and tenderizers. Bouillon cubes. Hot sauce. Tabasco sauce. Marinades. Taco seasonings. Relishes. °Fats and Oils °Butter, stick margarine, lard, shortening, ghee, and bacon fat. Coconut, palm kernel, or palm oils. Regular salad dressings. °Other °Pickles and olives. Salted popcorn and pretzels. °The items listed above may not be a complete list of foods and beverages to avoid. Contact your dietitian for more information. °WHERE CAN I FIND MORE INFORMATION? °National Heart, [employer], and [employer] Institute: www.nhlbi.nih.gov/health/health-topics/topics/dash/ °Document Released: 01/01/2011 Document Revised: 05/29/2013 Document Reviewed: 11/16/2012 °ExitCare® Patient Information ©2015 [employer]. This information is not intended to replace advice given to you by your health care provider. Make sure you discuss any questions you have with your health care provider. ° °

## 2014-08-14 NOTE — Progress Notes (Signed)
Patient states he is here for his annual check up and for results From his last visit

## 2014-09-10 ENCOUNTER — Ambulatory Visit: Payer: Self-pay

## 2014-12-05 NOTE — Progress Notes (Signed)
[facility] notified via fax. Landis Gandy, RN

## 2014-12-12 ENCOUNTER — Other Ambulatory Visit (INDEPENDENT_AMBULATORY_CARE_PROVIDER_SITE_OTHER): Payer: Self-pay

## 2014-12-12 DIAGNOSIS — R748 Abnormal levels of other serum enzymes: Secondary | ICD-10-CM

## 2014-12-12 DIAGNOSIS — B20 Human immunodeficiency virus [HIV] disease: Secondary | ICD-10-CM

## 2014-12-12 LAB — COMPREHENSIVE METABOLIC PANEL WITH GFR
ALT: 33 U/L (ref 9–46)
AST: 70 U/L — ABNORMAL HIGH (ref 10–35)
Albumin: 3.5 g/dL — ABNORMAL LOW (ref 3.6–5.1)
Alkaline Phosphatase: 60 U/L (ref 40–115)
BUN: 10 mg/dL (ref 7–25)
CO2: 26 mmol/L (ref 20–31)
Calcium: 8.9 mg/dL (ref 8.6–10.3)
Chloride: 106 mmol/L (ref 98–110)
Creat: 1.09 mg/dL (ref 0.70–1.33)
Glucose, Bld: 95 mg/dL (ref 65–99)
Potassium: 4.3 mmol/L (ref 3.5–5.3)
Sodium: 138 mmol/L (ref 135–146)
Total Bilirubin: 0.6 mg/dL (ref 0.2–1.2)
Total Protein: 6.7 g/dL (ref 6.1–8.1)

## 2014-12-13 ENCOUNTER — Telehealth: Payer: Self-pay | Admitting: *Deleted

## 2014-12-13 LAB — CBC
HCT: 41.9 % (ref 39.0–52.0)
Hemoglobin: 14.5 g/dL (ref 13.0–17.0)
MCH: 27.2 pg (ref 26.0–34.0)
MCHC: 34.6 g/dL (ref 30.0–36.0)
MCV: 78.5 fL (ref 78.0–100.0)
Platelets: 24 10*3/uL — CL (ref 150–400)
RBC: 5.34 MIL/uL (ref 4.22–5.81)
RDW: 19.3 % — AB (ref 11.5–15.5)
WBC: 2.5 10*3/uL — ABNORMAL LOW (ref 4.0–10.5)

## 2014-12-13 LAB — HEPATITIS C ANTIBODY: HCV AB: NEGATIVE

## 2014-12-13 LAB — HEPATITIS B SURFACE ANTIGEN: HEP B S AG: NEGATIVE

## 2014-12-13 LAB — HEPATITIS B SURFACE ANTIBODY,QUALITATIVE: Hep B S Ab: NEGATIVE

## 2014-12-13 LAB — T-HELPER CELL (CD4) - (RCID CLINIC ONLY)
CD4 T CELL ABS: 290 /uL — AB (ref 400–2700)
CD4 T CELL HELPER: 36 % (ref 33–55)

## 2014-12-13 NOTE — Telephone Encounter (Signed)
Alert Low Platelet = 24.  Patient's platelet count 6 months ago = 20.  Slight improvement.

## 2014-12-14 LAB — HIV-1 RNA QUANT-NO REFLEX-BLD

## 2014-12-25 ENCOUNTER — Encounter: Payer: Self-pay | Admitting: Internal Medicine

## 2014-12-25 ENCOUNTER — Ambulatory Visit (INDEPENDENT_AMBULATORY_CARE_PROVIDER_SITE_OTHER): Payer: Self-pay | Admitting: Internal Medicine

## 2014-12-25 DIAGNOSIS — B20 Human immunodeficiency virus [HIV] disease: Secondary | ICD-10-CM

## 2014-12-25 DIAGNOSIS — L219 Seborrheic dermatitis, unspecified: Secondary | ICD-10-CM

## 2014-12-25 MED ORDER — KETOCONAZOLE 2 % EX CREA: 1.0000 "application " | TOPICAL_CREAM | Freq: Two times a day (BID) | CUTANEOUS | Status: DC

## 2014-12-25 NOTE — Progress Notes (Signed)
Patient Active Problem List   Diagnosis Date Noted  . Human immunodeficiency virus (HIV) disease ([place]) 02/06/2006    Priority: High  . Elevated liver enzymes 06/14/2014  . Headache 12/07/2012  . SEBORRHEA 04/17/2008  . WARTS, OTHER SPECIFIED VIRAL 02/06/2006  . THROMBOCYTOPENIA 02/06/2006  . ERECTILE DYSFUNCTION 02/06/2006  . GERD 02/06/2006    Patient's Medications  New Prescriptions   KETOCONAZOLE (NIZORAL) 2 % CREAM    Apply 1 application topically 2 (two) times daily.  Previous Medications   DARUNAVIR ETHANOLATE (PREZISTA) 800 MG TABLET    Take 1 tablet (800 mg total) by mouth daily with breakfast.   ELVITEGRAVIR-COBICISTAT-EMTRICITABINE-TENOFOVIR (GENVOYA) 150-150-200-10 MG TABS TABLET    Take 1 tablet by mouth daily with breakfast.   MULTIPLE VITAMINS-MINERALS (MULTIVITAMIN PO)    Take by mouth.   SODIUM CHLORIDE (OCEAN) 0.65 % SOLN NASAL SPRAY    Place 1 spray into both nostrils as needed for congestion.   VITAMIN D, ERGOCALCIFEROL, (DRISDOL) 50000 UNITS CAPS CAPSULE    Take 1 capsule (50,000 Units total) by mouth every 7 (seven) days.  Modified Medications   No medications on file  Discontinued Medications   No medications on file    Subjective: Robert Ware is in for his routine HIV follow-up visit. He states that he developed some intermittent chest pains after he started taking Genvoya with Prezista following his last visit. He had previously tolerated Prezista and therefore was concerned that his symptoms were due to [facility] The pain resolved after the first 2 months and he has had no problems tolerating it since. He takes his medication during lunch around noon each day and has not missed any doses that he recalls. His only current concern is he said some increase in the rash on his face and neck. He recalls that it improved several years ago when I prescribed ketoconazole cream.   Review of Systems: Review of Systems  Constitutional: Negative for fever,  chills, weight loss, malaise/fatigue and diaphoresis.  HENT: Negative for sore throat.   Respiratory: Negative for cough, sputum production and shortness of breath.   Cardiovascular: Negative for chest pain.  Gastrointestinal: Negative for nausea, vomiting and diarrhea.  Genitourinary: Negative for dysuria and frequency.  Musculoskeletal: Negative for myalgias and joint pain.  Skin: Positive for rash.  Neurological: Negative for focal weakness.  Endo/Heme/Allergies: Does not bruise/bleed easily.  Psychiatric/Behavioral: Negative for depression and substance abuse. The patient is not nervous/anxious.     No past medical history on file.  Social History  Substance Use Topics  . Smoking status: Never Smoker   . Smokeless tobacco: Never Used  . Alcohol Use: 0.0 oz/week    0 Standard drinks or equivalent per week     Comment: occassionaly    Family History  Problem Relation Age of Onset  . Stroke Father   . Diabetes Mother   . Hypertension Mother     No Known Allergies  Objective:  Filed Vitals:   12/25/14 0916 12/25/14 0917  BP:  146/92  Pulse:  78  Temp:  98.2 F (36.8 C)  Height:  6\' 2"  (1.88 m)  Weight: 221 lb 12.8 oz (100.608 kg) 221 lb 12.8 oz (100.608 kg)   Body mass index is 28.47 kg/(m^2).  Physical Exam  Constitutional: He is oriented to person, place, and time.  He is pleasant and in good spirits as usual.  HENT:  Mouth/Throat: No oropharyngeal exudate.  Eyes: Conjunctivae are normal.  Cardiovascular: Normal rate and regular rhythm.   No murmur heard. Pulmonary/Chest: Breath sounds normal.  Abdominal: Soft. He exhibits no mass. There is no tenderness.  Musculoskeletal: Normal range of motion.  Neurological: He is alert and oriented to person, place, and time.  Skin: Rash noted.  Some hyperpigmentation and scaliness on forehead and posterior neck.  Psychiatric: Mood and affect normal.    Lab Results Lab Results  Component Value Date   WBC 2.5*  12/12/2014   HGB 14.5 12/12/2014   HCT 41.9 12/12/2014   MCV 78.5 12/12/2014   PLT 24* 12/12/2014    Lab Results  Component Value Date   CREATININE 1.09 12/12/2014   BUN 10 12/12/2014   NA 138 12/12/2014   K 4.3 12/12/2014   CL 106 12/12/2014   CO2 26 12/12/2014    Lab Results  Component Value Date   ALT 33 12/12/2014   AST 70* 12/12/2014   ALKPHOS 60 12/12/2014   BILITOT 0.6 12/12/2014    Lab Results  Component Value Date   CHOL 156 05/29/2014   HDL 62 05/29/2014   LDLCALC 85 05/29/2014   TRIG 45 05/29/2014   CHOLHDL 2.5 05/29/2014    Lab Results HIV 1 RNA QUANT (copies/mL)  Date Value  12/12/2014 <20  05/29/2014 <20  11/08/2013 <20   CD4 T CELL ABS (/uL)  Date Value  12/12/2014 290*  05/29/2014 840  11/08/2013 280*      Problem List Items Addressed This Visit      High   Human immunodeficiency virus (HIV) disease ([place])    His infection remains under very good control. I will continue Genvoya and Prezista and see him back after lab work in 6 months.      Relevant Medications   ketoconazole (NIZORAL) 2 % cream   Other Relevant Orders   T-helper cell (CD4)- ([facility] only)   HIV 1 RNA quant-no reflex-bld   CBC   Comprehensive metabolic panel   Lipid panel   RPR     Unprioritized   SEBORRHEA    I suspect that his rash is due to a flare of his seborrhea. I will give him a refill of ketoconazole cream.      Relevant Medications   ketoconazole (NIZORAL) 2 % cream        Michel Bickers, MD [facility] [place] [facility] [phone] pager   [phone] cell 12/25/2014, 9:56 AM

## 2014-12-25 NOTE — Assessment & Plan Note (Signed)
His infection remains under very good control. I will continue Genvoya and Prezista and see him back after lab work in 6 months.

## 2014-12-25 NOTE — Assessment & Plan Note (Signed)
I suspect that his rash is due to a flare of his seborrhea. I will give him a refill of ketoconazole cream.

## 2015-01-01 ENCOUNTER — Other Ambulatory Visit: Payer: Self-pay | Admitting: *Deleted

## 2015-01-01 DIAGNOSIS — L219 Seborrheic dermatitis, unspecified: Secondary | ICD-10-CM

## 2015-01-01 MED ORDER — KETOCONAZOLE 2 % EX CREA: 1.0000 "application " | TOPICAL_CREAM | Freq: Two times a day (BID) | CUTANEOUS | Status: DC

## 2015-03-01 ENCOUNTER — Other Ambulatory Visit: Payer: Self-pay | Admitting: Internal Medicine

## 2015-03-01 DIAGNOSIS — L219 Seborrheic dermatitis, unspecified: Secondary | ICD-10-CM

## 2015-03-20 ENCOUNTER — Ambulatory Visit: Payer: Self-pay

## 2015-05-03 ENCOUNTER — Other Ambulatory Visit: Payer: Self-pay | Admitting: Internal Medicine

## 2015-06-03 ENCOUNTER — Other Ambulatory Visit: Payer: Self-pay | Admitting: *Deleted

## 2015-06-03 DIAGNOSIS — B20 Human immunodeficiency virus [HIV] disease: Secondary | ICD-10-CM

## 2015-06-03 MED ORDER — DARUNAVIR ETHANOLATE 800 MG PO TABS: 800.0000 mg | ORAL_TABLET | Freq: Every day | ORAL | Status: DC

## 2015-06-03 MED ORDER — ELVITEG-COBIC-EMTRICIT-TENOFAF 150-150-200-10 MG PO TABS: 1.0000 | ORAL_TABLET | Freq: Every day | ORAL | Status: DC

## 2015-06-11 ENCOUNTER — Other Ambulatory Visit (INDEPENDENT_AMBULATORY_CARE_PROVIDER_SITE_OTHER): Payer: Self-pay

## 2015-06-11 DIAGNOSIS — B20 Human immunodeficiency virus [HIV] disease: Secondary | ICD-10-CM

## 2015-06-11 DIAGNOSIS — Z79899 Other long term (current) drug therapy: Secondary | ICD-10-CM

## 2015-06-11 DIAGNOSIS — Z113 Encounter for screening for infections with a predominantly sexual mode of transmission: Secondary | ICD-10-CM

## 2015-06-11 LAB — CBC
HEMATOCRIT: 43.8 % (ref 38.5–50.0)
HEMOGLOBIN: 15.2 g/dL (ref 13.2–17.1)
MCH: 26.9 pg — ABNORMAL LOW (ref 27.0–33.0)
MCHC: 34.7 g/dL (ref 32.0–36.0)
MCV: 77.4 fL — ABNORMAL LOW (ref 80.0–100.0)
Platelets: 22 10*3/uL — CL (ref 140–400)
RBC: 5.66 MIL/uL (ref 4.20–5.80)
RDW: 20.4 % — ABNORMAL HIGH (ref 11.0–15.0)
WBC: 2.6 10*3/uL — AB (ref 3.8–10.8)

## 2015-06-11 LAB — COMPREHENSIVE METABOLIC PANEL
ALBUMIN: 3.7 g/dL (ref 3.6–5.1)
ALK PHOS: 48 U/L (ref 40–115)
ALT: 43 U/L (ref 9–46)
AST: 59 U/L — AB (ref 10–35)
BUN: 14 mg/dL (ref 7–25)
CO2: 24 mmol/L (ref 20–31)
CREATININE: 1.03 mg/dL (ref 0.70–1.33)
Calcium: 8.8 mg/dL (ref 8.6–10.3)
Chloride: 106 mmol/L (ref 98–110)
GLUCOSE: 103 mg/dL — AB (ref 65–99)
Potassium: 4 mmol/L (ref 3.5–5.3)
Sodium: 139 mmol/L (ref 135–146)
TOTAL PROTEIN: 7.3 g/dL (ref 6.1–8.1)
Total Bilirubin: 1.1 mg/dL (ref 0.2–1.2)

## 2015-06-11 LAB — LIPID PANEL
CHOLESTEROL: 181 mg/dL (ref 125–200)
HDL: 73 mg/dL (ref >=40)
LDL Cholesterol: 96 mg/dL (ref <130)
Total CHOL/HDL Ratio: 2.5 Ratio (ref <=5.0)
Triglycerides: 62 mg/dL (ref <150)
VLDL: 12 mg/dL (ref <30)

## 2015-06-12 LAB — HIV-1 RNA QUANT-NO REFLEX-BLD
HIV 1 RNA Quant: <20 copies/mL (ref <20)
HIV-1 RNA Quant, Log: <1.3 Log copies/mL (ref <1.30)

## 2015-06-12 LAB — T-HELPER CELL (CD4) - (RCID CLINIC ONLY)
CD4 T CELL ABS: 310 /uL — AB (ref 400–2700)
CD4 T CELL HELPER: 34 % (ref 33–55)

## 2015-06-12 LAB — RPR

## 2015-06-25 ENCOUNTER — Ambulatory Visit (INDEPENDENT_AMBULATORY_CARE_PROVIDER_SITE_OTHER): Payer: Self-pay | Admitting: Internal Medicine

## 2015-06-25 DIAGNOSIS — B20 Human immunodeficiency virus [HIV] disease: Secondary | ICD-10-CM

## 2015-06-25 NOTE — Assessment & Plan Note (Signed)
His infection remains under excellent control with long-term undetectable viral loads. He will continue his current antiretroviral regimen and follow-up after lab work in 6 months.

## 2015-06-25 NOTE — Progress Notes (Signed)
Patient Active Problem List   Diagnosis Date Noted  . Human immunodeficiency virus (HIV) disease ([place]) 02/06/2006    Priority: High  . Elevated liver enzymes 06/14/2014  . Headache 12/07/2012  . SEBORRHEA 04/17/2008  . WARTS, OTHER SPECIFIED VIRAL 02/06/2006  . THROMBOCYTOPENIA 02/06/2006  . ERECTILE DYSFUNCTION 02/06/2006  . GERD 02/06/2006    Patient's Medications  New Prescriptions   No medications on file  Previous Medications   DARUNAVIR ETHANOLATE (PREZISTA) 800 MG TABLET    Take 1 tablet (800 mg total) by mouth daily with breakfast.   ELVITEGRAVIR-COBICISTAT-EMTRICITABINE-TENOFOVIR (GENVOYA) 150-150-200-10 MG TABS TABLET    Take 1 tablet by mouth daily with breakfast.   KETOCONAZOLE (NIZORAL) 2 % CREAM    APPLY EXTERNALLY TO THE AFFECTED AREA TWICE DAILY   MULTIPLE VITAMINS-MINERALS (MULTIVITAMIN PO)    Take by mouth.   SODIUM CHLORIDE (OCEAN) 0.65 % SOLN NASAL SPRAY    Place 1 spray into both nostrils as needed for congestion.   VITAMIN D, ERGOCALCIFEROL, (DRISDOL) 50000 UNITS CAPS CAPSULE    Take 1 capsule (50,000 Units total) by mouth every 7 (seven) days.  Modified Medications   No medications on file  Discontinued Medications   No medications on file    Subjective: Robert Ware is in for his routine HIV follow-up visit. He has had a little bit of intermittent itching in his scalp but otherwise has been feeling well. He has not changed any of his medications. 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 He is aware that he will have to recertify in July. He takes his medication each morning with breakfast.   Review of Systems: Review of Systems  Constitutional: Negative for fever, chills, weight loss, malaise/fatigue and diaphoresis.  HENT: Negative for sore throat.   Respiratory: Negative for cough, sputum production and shortness of breath.   Cardiovascular:  Negative for chest pain.  Gastrointestinal: Negative for nausea, vomiting, abdominal pain and diarrhea.  Skin: Positive for itching. Negative for rash.  Neurological: Negative for headaches.    No past medical history on file.  Social History  Substance Use Topics  . Smoking status: Never Smoker   . Smokeless tobacco: Never Used  . Alcohol Use: 0.0 oz/week    0 Standard drinks or equivalent per week     Comment: occassionaly    Family History  Problem Relation Age of Onset  . Stroke Father   . Diabetes Mother   . Hypertension Mother     No Known Allergies  Objective:  Filed Vitals:   06/25/15 0935  BP: 108/77  Pulse: 71  Temp: 98.1 F (36.7 C)  TempSrc: Oral  Weight: 223 lb (101.152 kg)   Body mass index is 28.62 kg/(m^2).  Physical Exam  Constitutional: He is oriented to person, place, and time.  He is smiling and in good spirits as usual.  HENT:  Mouth/Throat: No oropharyngeal exudate.  Eyes: Conjunctivae are normal.  Cardiovascular: Normal rate and regular rhythm.   No murmur heard. Pulmonary/Chest: Breath sounds normal. He has no rales.  Abdominal: Soft. There is no tenderness.  Musculoskeletal: Normal range of motion.  Neurological: He is alert and oriented to person, place, and time.  Skin: No rash noted.  I do not notice any lesions in his scalp that might precipitate itching.  Psychiatric: Mood and affect normal.  Lab Results Lab Results  Component Value Date   WBC 2.6* 06/11/2015   HGB 15.2 06/11/2015   HCT 43.8 06/11/2015   MCV 77.4* 06/11/2015   PLT 22* 06/11/2015    Lab Results  Component Value Date   CREATININE 1.03 06/11/2015   BUN 14 06/11/2015   NA 139 06/11/2015   K 4.0 06/11/2015   CL 106 06/11/2015   CO2 24 06/11/2015    Lab Results  Component Value Date   ALT 43 06/11/2015   AST 59* 06/11/2015   ALKPHOS 48 06/11/2015   BILITOT 1.1 06/11/2015    Lab Results  Component Value Date   CHOL 181 06/11/2015   HDL 73  06/11/2015   LDLCALC 96 06/11/2015   TRIG 62 06/11/2015   CHOLHDL 2.5 06/11/2015    Lab Results HIV 1 RNA QUANT (copies/mL)  Date Value  06/11/2015 <20  12/12/2014 <20  05/29/2014 <20   CD4 T CELL ABS (/uL)  Date Value  06/11/2015 310*  12/12/2014 290*  05/29/2014 840      Problem List Items Addressed This Visit      High   Human immunodeficiency virus (HIV) disease ([name])    His infection remains under excellent control with long-term undetectable viral loads. He will continue his current antiretroviral regimen and follow-up after lab work in 6 months.      Relevant Orders   T-helper cell (CD4)- ([facility] only)   HIV 1 RNA quant-no reflex-bld        Michel Bickers, MD [facility] [place] [facility] [phone] pager   [phone] cell 06/25/2015, 9:50 AM

## 2015-07-31 ENCOUNTER — Ambulatory Visit: Payer: Self-pay

## 2015-09-26 ENCOUNTER — Encounter: Payer: Self-pay | Admitting: Internal Medicine

## 2015-12-17 ENCOUNTER — Other Ambulatory Visit (INDEPENDENT_AMBULATORY_CARE_PROVIDER_SITE_OTHER): Payer: Self-pay

## 2015-12-17 DIAGNOSIS — B20 Human immunodeficiency virus [HIV] disease: Secondary | ICD-10-CM

## 2015-12-17 DIAGNOSIS — Z113 Encounter for screening for infections with a predominantly sexual mode of transmission: Secondary | ICD-10-CM

## 2015-12-17 DIAGNOSIS — Z79899 Other long term (current) drug therapy: Secondary | ICD-10-CM

## 2015-12-18 LAB — T-HELPER CELL (CD4) - (RCID CLINIC ONLY)
CD4 % Helper T Cell: 33 % (ref 33–55)
CD4 T Cell Abs: 290 /uL — ABNORMAL LOW (ref 400–2700)

## 2015-12-20 LAB — HIV-1 RNA QUANT-NO REFLEX-BLD
HIV 1 RNA QUANT: 27 {copies}/mL — AB (ref <20)
HIV-1 RNA Quant, Log: 1.43 Log copies/mL — ABNORMAL HIGH (ref <1.30)

## 2015-12-31 ENCOUNTER — Ambulatory Visit (INDEPENDENT_AMBULATORY_CARE_PROVIDER_SITE_OTHER): Payer: Self-pay | Admitting: Internal Medicine

## 2015-12-31 ENCOUNTER — Encounter: Payer: Self-pay | Admitting: Internal Medicine

## 2015-12-31 DIAGNOSIS — B20 Human immunodeficiency virus [HIV] disease: Secondary | ICD-10-CM

## 2015-12-31 DIAGNOSIS — R748 Abnormal levels of other serum enzymes: Secondary | ICD-10-CM

## 2015-12-31 DIAGNOSIS — D696 Thrombocytopenia, unspecified: Secondary | ICD-10-CM

## 2015-12-31 NOTE — Assessment & Plan Note (Signed)
His infection remains under good, long-term control. He will continue Genvoya and Prezista and follow-up after lab work in 6 months.

## 2015-12-31 NOTE — Assessment & Plan Note (Signed)
He has chronic elevation of his AST. He does not drink alcohol and has no serologic evidence of hepatitis. He may have a component of fatty liver.

## 2015-12-31 NOTE — Progress Notes (Signed)
Patient Active Problem List   Diagnosis Date Noted  . Human immunodeficiency virus (HIV) disease ([place]) 02/06/2006    Priority: High  . Elevated liver enzymes 06/14/2014  . Headache 12/07/2012  . SEBORRHEA 04/17/2008  . WARTS, OTHER SPECIFIED VIRAL 02/06/2006  . Thrombocytopenia ([place]) 02/06/2006  . ERECTILE DYSFUNCTION 02/06/2006  . GERD 02/06/2006    Patient's Medications  New Prescriptions   No medications on file  Previous Medications   DARUNAVIR ETHANOLATE (PREZISTA) 800 MG TABLET    Take 1 tablet (800 mg total) by mouth daily with breakfast.   ELVITEGRAVIR-COBICISTAT-EMTRICITABINE-TENOFOVIR (GENVOYA) 150-150-200-10 MG TABS TABLET    Take 1 tablet by mouth daily with breakfast.   KETOCONAZOLE (NIZORAL) 2 % CREAM    APPLY EXTERNALLY TO THE AFFECTED AREA TWICE DAILY   MULTIPLE VITAMINS-MINERALS (MULTIVITAMIN PO)    Take by mouth.   SODIUM CHLORIDE (OCEAN) 0.65 % SOLN NASAL SPRAY    Place 1 spray into both nostrils as needed for congestion.   VITAMIN D, ERGOCALCIFEROL, (DRISDOL) 50000 UNITS CAPS CAPSULE    Take 1 capsule (50,000 Units total) by mouth every 7 (seven) days.  Modified Medications   No medications on file  Discontinued Medications   No medications on file    Subjective: Robert Ware Is in for his routine HIV follow-up visit. He has had no problems obtaining, tolerating or taking his Genvoya and Prezista. He takes them each morning with breakfast. He never misses any doses. He is not on any other medications currently. He has occasional low back pain that he treats with Tylenol. Otherwise he is doing well and without any other health concerns.   Review of Systems: Review of Systems  Constitutional: Negative for chills, diaphoresis, fever, malaise/fatigue and weight loss.  HENT: Negative for sore throat.   Respiratory: Negative for cough, sputum production and shortness of breath.   Cardiovascular: Negative for chest pain.  Gastrointestinal: Negative for  abdominal pain, diarrhea, heartburn, nausea and vomiting.  Genitourinary: Negative for dysuria and frequency.  Musculoskeletal: Negative for joint pain and myalgias.  Skin: Negative for rash.  Neurological: Negative for dizziness and headaches.  Psychiatric/Behavioral: Negative for depression and substance abuse. The patient is not nervous/anxious.     No past medical history on file.  Social History  Substance Use Topics  . Smoking status: Never Smoker  . Smokeless tobacco: Never Used  . Alcohol use 0.0 oz/week     Comment: occassionaly    Family History  Problem Relation Age of Onset  . Stroke Father   . Diabetes Mother   . Hypertension Mother     No Known Allergies  Objective:  Vitals:   12/31/15 0921  BP: 133/76  Pulse: 76  Temp: 97.9 F (36.6 C)  TempSrc: Oral  Weight: 224 lb (101.6 kg)  Height: 6\' 1"  (1.854 m)   Body mass index is 29.55 kg/m.  Physical Exam  Constitutional: He is oriented to person, place, and time.  He is in good spirits as usual.  HENT:  Mouth/Throat: No oropharyngeal exudate.  Eyes: Conjunctivae are normal.  Neck: Neck supple.  Cardiovascular: Normal rate and regular rhythm.   No murmur heard. Pulmonary/Chest: Effort normal and breath sounds normal. He has no wheezes. He has no rales.  Abdominal: Soft. He exhibits no mass. There is no tenderness.  Musculoskeletal: Normal range of motion. He exhibits no edema or tenderness.  Neurological: He is alert and oriented to person, place, and time. Gait  normal.  Skin: No rash noted.  Psychiatric: Mood and affect normal.    Lab Results Lab Results  Component Value Date   WBC 2.6 (L) 06/11/2015   HGB 15.2 06/11/2015   HCT 43.8 06/11/2015   MCV 77.4 (L) 06/11/2015   PLT 22 (LL) 06/11/2015    Lab Results  Component Value Date   CREATININE 1.03 06/11/2015   BUN 14 06/11/2015   NA 139 06/11/2015   K 4.0 06/11/2015   CL 106 06/11/2015   CO2 24 06/11/2015    Lab Results  Component  Value Date   ALT 43 06/11/2015   AST 59 (H) 06/11/2015   ALKPHOS 48 06/11/2015   BILITOT 1.1 06/11/2015    Lab Results  Component Value Date   CHOL 181 06/11/2015   HDL 73 06/11/2015   LDLCALC 96 06/11/2015   TRIG 62 06/11/2015   CHOLHDL 2.5 06/11/2015   HIV 1 RNA Quant (copies/mL)  Date Value  12/17/2015 27 (H)  06/11/2015 <20  12/12/2014 <20   CD4 T Cell Abs (/uL)  Date Value  12/17/2015 290 (L)  06/11/2015 310 (L)  12/12/2014 290 (L)     Problem List Items Addressed This Visit      High   Human immunodeficiency virus (HIV) disease ([place])    His infection remains under good, long-term control. He will continue Genvoya and Prezista and follow-up after lab work in 6 months.      Relevant Orders   T-helper cell (CD4)- ([facility] only)   HIV 1 RNA quant-no reflex-bld   CBC   Comprehensive metabolic panel   Lipid panel   RPR     Unprioritized   Elevated liver enzymes    He has chronic elevation of his AST. He does not drink alcohol and has no serologic evidence of hepatitis. He may have a component of fatty liver.      Thrombocytopenia ([place])    He has chronic, stable thrombocytopenia of unknown cause. He has no evidence of hepatitis or liver disease although he has had chronic low level elevations of his AST. He has never had any episodes of bleeding.           Michel Bickers, MD [facility] [place] [facility] [phone] pager   [phone] cell 12/31/2015, 9:44 AM

## 2015-12-31 NOTE — Assessment & Plan Note (Signed)
He has chronic, stable thrombocytopenia of unknown cause. He has no evidence of hepatitis or liver disease although he has had chronic low level elevations of his AST. He has never had any episodes of bleeding.

## 2016-03-20 ENCOUNTER — Ambulatory Visit: Payer: Self-pay

## 2016-04-02 ENCOUNTER — Encounter: Payer: Self-pay | Admitting: Internal Medicine

## 2016-05-13 ENCOUNTER — Other Ambulatory Visit: Payer: Self-pay | Admitting: Internal Medicine

## 2016-05-13 DIAGNOSIS — B20 Human immunodeficiency virus [HIV] disease: Secondary | ICD-10-CM

## 2016-06-15 ENCOUNTER — Other Ambulatory Visit (INDEPENDENT_AMBULATORY_CARE_PROVIDER_SITE_OTHER): Payer: Self-pay

## 2016-06-15 DIAGNOSIS — B20 Human immunodeficiency virus [HIV] disease: Secondary | ICD-10-CM

## 2016-06-15 LAB — LIPID PANEL
CHOL/HDL RATIO: 2.8 ratio (ref <5.0)
CHOLESTEROL: 179 mg/dL (ref <200)
HDL: 65 mg/dL (ref >40)
LDL Cholesterol: 101 mg/dL — ABNORMAL HIGH (ref <100)
TRIGLYCERIDES: 63 mg/dL (ref <150)
VLDL: 13 mg/dL (ref <30)

## 2016-06-15 LAB — COMPREHENSIVE METABOLIC PANEL
ALBUMIN: 3.6 g/dL (ref 3.6–5.1)
ALK PHOS: 48 U/L (ref 40–115)
ALT: 55 U/L — AB (ref 9–46)
AST: 73 U/L — ABNORMAL HIGH (ref 10–35)
BUN: 15 mg/dL (ref 7–25)
CALCIUM: 8.7 mg/dL (ref 8.6–10.3)
CO2: 24 mmol/L (ref 20–31)
Chloride: 106 mmol/L (ref 98–110)
Creat: 1.1 mg/dL (ref 0.70–1.33)
Glucose, Bld: 81 mg/dL (ref 65–99)
POTASSIUM: 4.2 mmol/L (ref 3.5–5.3)
Sodium: 136 mmol/L (ref 135–146)
TOTAL PROTEIN: 7 g/dL (ref 6.1–8.1)
Total Bilirubin: 1.3 mg/dL — ABNORMAL HIGH (ref 0.2–1.2)

## 2016-06-15 LAB — CBC
HEMATOCRIT: 44.7 % (ref 38.5–50.0)
Hemoglobin: 15.2 g/dL (ref 13.2–17.1)
MCH: 27.3 pg (ref 27.0–33.0)
MCHC: 34 g/dL (ref 32.0–36.0)
MCV: 80.4 fL (ref 80.0–100.0)
PLATELETS: 37 10*3/uL — AB (ref 140–400)
RBC: 5.56 MIL/uL (ref 4.20–5.80)
RDW: 18.2 % — ABNORMAL HIGH (ref 11.0–15.0)
WBC: 2.2 10*3/uL — AB (ref 3.8–10.8)

## 2016-06-16 ENCOUNTER — Other Ambulatory Visit: Payer: Self-pay

## 2016-06-16 LAB — RPR

## 2016-06-16 LAB — T-HELPER CELL (CD4) - (RCID CLINIC ONLY)
CD4 T CELL ABS: 250 /uL — AB (ref 400–2700)
CD4 T CELL HELPER: 31 % — AB (ref 33–55)

## 2016-06-17 LAB — HIV-1 RNA QUANT-NO REFLEX-BLD
HIV 1 RNA Quant: <20 copies/mL
HIV-1 RNA Quant, Log: <1.3 Log copies/mL

## 2016-06-30 ENCOUNTER — Ambulatory Visit (INDEPENDENT_AMBULATORY_CARE_PROVIDER_SITE_OTHER): Payer: Self-pay | Admitting: Internal Medicine

## 2016-06-30 ENCOUNTER — Encounter: Payer: Self-pay | Admitting: Internal Medicine

## 2016-06-30 DIAGNOSIS — R748 Abnormal levels of other serum enzymes: Secondary | ICD-10-CM

## 2016-06-30 DIAGNOSIS — D696 Thrombocytopenia, unspecified: Secondary | ICD-10-CM

## 2016-06-30 DIAGNOSIS — B20 Human immunodeficiency virus [HIV] disease: Secondary | ICD-10-CM

## 2016-06-30 NOTE — Assessment & Plan Note (Signed)
He has chronic elevation of his AST and now his ALT is elevated as well. He does not drink alcohol and has had no serologic evidence of hepatitis in the past. He may have some fatty liver. I will see if I can arrange an abdominal ultrasound to evaluate his liver and spleen.

## 2016-06-30 NOTE — Progress Notes (Signed)
Patient Active Problem List   Diagnosis Date Noted  . Human immunodeficiency virus (HIV) disease ([place]) 02/06/2006    Priority: High  . Elevated liver enzymes 06/14/2014  . Headache 12/07/2012  . SEBORRHEA 04/17/2008  . WARTS, OTHER SPECIFIED VIRAL 02/06/2006  . Thrombocytopenia ([place]) 02/06/2006  . ERECTILE DYSFUNCTION 02/06/2006  . GERD 02/06/2006    Patient's Medications  New Prescriptions   No medications on file  Previous Medications   GENVOYA 150-150-200-10 MG TABS TABLET    TAKE 1 TABLET BY MOUTH DAILY WITH BREAKFAST   KETOCONAZOLE (NIZORAL) 2 % CREAM    APPLY EXTERNALLY TO THE AFFECTED AREA TWICE DAILY   MULTIPLE VITAMINS-MINERALS (MULTIVITAMIN PO)    Take by mouth.   PREZISTA 800 MG TABLET    TAKE 1 TABLET(800 MG) BY MOUTH DAILY WITH BREAKFAST   SODIUM CHLORIDE (OCEAN) 0.65 % SOLN NASAL SPRAY    Place 1 spray into both nostrils as needed for congestion.   VITAMIN D, ERGOCALCIFEROL, (DRISDOL) 50000 UNITS CAPS CAPSULE    Take 1 capsule (50,000 Units total) by mouth every 7 (seven) days.  Modified Medications   No medications on file  Discontinued Medications   No medications on file    Subjective: Robert Ware is in for his routine HIV follow-up visit. Robert Ware has had no problems obtaining, taking or tolerating his Genvoya and Prezista. Robert Ware denies missing any doses. Robert Ware is not on any other medications. 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 Robert Ware has not been able to find other work. Robert Ware has not had any bleeding problems since his last visit.   Review of Systems: Review of Systems  Constitutional: Negative for chills, diaphoresis, fever, malaise/fatigue and weight loss.  HENT: Negative for sore throat.   Respiratory: Negative for cough, sputum production and shortness of breath.   Cardiovascular: Negative for chest pain.  Gastrointestinal: Negative for abdominal pain, diarrhea, heartburn,  nausea and vomiting.  Genitourinary: Negative for dysuria and frequency.  Musculoskeletal: Positive for back pain. Negative for joint pain and myalgias.       Robert Ware has had some mild, intermittent right lower back pain recently.  Skin: Negative for rash.  Neurological: Negative for dizziness and headaches.  Psychiatric/Behavioral: Negative for depression and substance abuse. The patient is not nervous/anxious.     No past medical history on file.  Social History  Substance Use Topics  . Smoking status: Never Smoker  . Smokeless tobacco: Never Used  . Alcohol use 0.0 oz/week     Comment: occassionaly    Family History  Problem Relation Age of Onset  . Stroke Father   . Diabetes Mother   . Hypertension Mother     No Known Allergies  Objective:  Vitals:   06/30/16 0928  BP: 130/80  Pulse: 71  Temp: 97.5 F (36.4 C)  TempSrc: Oral  Weight: 222 lb (100.7 kg)  Height: 6\' 1"  (1.854 m)   Body mass index is 29.29 kg/m.  Physical Exam  Constitutional: Robert Ware is oriented to person, place, and time.  Robert Ware appears somewhat worried about his financial issues but otherwise is in no distress.  HENT:  Mouth/Throat: No oropharyngeal exudate.  Eyes: Conjunctivae are normal.  Cardiovascular: Normal rate and regular rhythm.   No murmur heard. Pulmonary/Chest: Effort normal and breath sounds normal. Robert Ware has no wheezes. Robert Ware has no rales.  Abdominal: Soft.  Robert Ware exhibits no mass. There is no tenderness.  Musculoskeletal: Normal range of motion.  Neurological: Robert Ware is alert and oriented to person, place, and time. Gait normal.  Skin: No rash noted.  Psychiatric: Mood and affect normal.    Lab Results Lab Results  Component Value Date   WBC 2.2 (L) 06/15/2016   HGB 15.2 06/15/2016   HCT 44.7 06/15/2016   MCV 80.4 06/15/2016   PLT 37 (L) 06/15/2016    Lab Results  Component Value Date   CREATININE 1.10 06/15/2016   BUN 15 06/15/2016   NA 136 06/15/2016   K 4.2 06/15/2016   CL 106  06/15/2016   CO2 24 06/15/2016    Lab Results  Component Value Date   ALT 55 (H) 06/15/2016   AST 73 (H) 06/15/2016   ALKPHOS 48 06/15/2016   BILITOT 1.3 (H) 06/15/2016    Lab Results  Component Value Date   CHOL 179 06/15/2016   HDL 65 06/15/2016   LDLCALC 101 (H) 06/15/2016   TRIG 63 06/15/2016   CHOLHDL 2.8 06/15/2016   HIV 1 RNA Quant (copies/mL)  Date Value  06/15/2016 <20 NOT DETECTED  12/17/2015 27 (H)  06/11/2015 <20   CD4 T Cell Abs (/uL)  Date Value  06/15/2016 250 (L)  12/17/2015 290 (L)  06/11/2015 310 (L)     Problem List Items Addressed This Visit    None        Michel Bickers, MD [place] [place] [phone] pager   [phone] cell 06/30/2016, 9:51 AM

## 2016-06-30 NOTE — Assessment & Plan Note (Signed)
His infection remains under excellent, long-term control. He will continue Genvoya and Prezista and follow-up after lab work in 6 months.

## 2016-06-30 NOTE — Addendum Note (Signed)
Addended by: Michel Bickers on: 06/30/2016 10:30 AM   Modules accepted: Orders

## 2016-06-30 NOTE — Assessment & Plan Note (Signed)
He has chronic thrombocytopenia without any episodes of abnormal bleeding. It is unusual to have thrombocytopenia to this degree with well-controlled HIV infection. This may be related to occult liver disease. I will check a peripheral blood smear and see if we can arrange an abdominal ultrasound before his next visit.

## 2016-08-17 ENCOUNTER — Ambulatory Visit: Payer: Self-pay

## 2016-08-20 ENCOUNTER — Encounter: Payer: Self-pay | Admitting: Internal Medicine

## 2016-10-29 ENCOUNTER — Emergency Department (HOSPITAL_COMMUNITY)
Admission: EM | Admit: 2016-10-29 | Discharge: 2016-10-29 | Disposition: A | Discharge status: Home / Self Care | Payer: No Typology Code available for payment source | Attending: Emergency Medicine | Admitting: Emergency Medicine

## 2016-10-29 ENCOUNTER — Emergency Department (HOSPITAL_COMMUNITY): Payer: No Typology Code available for payment source

## 2016-10-29 ENCOUNTER — Encounter (HOSPITAL_COMMUNITY): Payer: Self-pay | Admitting: Emergency Medicine

## 2016-10-29 DIAGNOSIS — M25572 Pain in left ankle and joints of left foot: Secondary | ICD-10-CM | POA: Insufficient documentation

## 2016-10-29 DIAGNOSIS — M545 Low back pain, unspecified: Secondary | ICD-10-CM

## 2016-10-29 DIAGNOSIS — M542 Cervicalgia: Secondary | ICD-10-CM | POA: Diagnosis present

## 2016-10-29 DIAGNOSIS — R51 Headache: Secondary | ICD-10-CM | POA: Diagnosis not present

## 2016-10-29 DIAGNOSIS — Z79899 Other long term (current) drug therapy: Secondary | ICD-10-CM | POA: Diagnosis not present

## 2016-10-29 MED ORDER — IBUPROFEN 400 MG PO TABS
600.0000 mg | ORAL_TABLET | Freq: Once | ORAL | Status: AC
  Administered 2016-10-29: 600 mg via ORAL
  Filled 2016-10-29: qty 1

## 2016-10-29 MED ORDER — OXYCODONE-ACETAMINOPHEN 5-325 MG PO TABS
1.0000 | ORAL_TABLET | Freq: Once | ORAL | Status: DC
  Filled 2016-10-29: qty 1

## 2016-10-29 MED ORDER — CYCLOBENZAPRINE HCL 10 MG PO TABS
10.0000 mg | ORAL_TABLET | Freq: Once | ORAL | Status: AC
  Administered 2016-10-29: 10 mg via ORAL
  Filled 2016-10-29: qty 1

## 2016-10-29 MED ORDER — CYCLOBENZAPRINE HCL 10 MG PO TABS: 10.0000 mg | ORAL_TABLET | Freq: Two times a day (BID) | ORAL | 0 refills | Status: DC | PRN

## 2016-10-29 NOTE — ED Notes (Signed)
ED Provider at bedside. 

## 2016-10-29 NOTE — Discharge Instructions (Signed)
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
## 2016-10-29 NOTE — ED Notes (Signed)
Pt transported to radiology.

## 2016-10-29 NOTE — ED Notes (Signed)
Pt asking how long until he sees a provider. This RN explained EDP would be over to assess patient as soon as possible. Will continue to round and update pt.
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
## 2016-10-30 NOTE — ED Notes (Signed)
Pt refused ordered percocet. Pt unsure if he will be able to find a ride home. This RN had already opened packet, 5-325 mg percocet wasted in sharps with Stefan Church, RN.

## 2016-11-18 ENCOUNTER — Other Ambulatory Visit: Payer: Self-pay | Admitting: Internal Medicine

## 2016-11-18 DIAGNOSIS — B20 Human immunodeficiency virus [HIV] disease: Secondary | ICD-10-CM

## 2016-12-15 ENCOUNTER — Other Ambulatory Visit: Payer: Self-pay

## 2016-12-15 DIAGNOSIS — B20 Human immunodeficiency virus [HIV] disease: Secondary | ICD-10-CM

## 2016-12-15 LAB — COMPREHENSIVE METABOLIC PANEL
AG RATIO: 1.1 (calc) (ref 1.0–2.5)
ALBUMIN MSPROF: 3.6 g/dL (ref 3.6–5.1)
ALKALINE PHOSPHATASE (APISO): 55 U/L (ref 40–115)
ALT: 55 U/L — ABNORMAL HIGH (ref 9–46)
AST: 71 U/L — ABNORMAL HIGH (ref 10–35)
BILIRUBIN TOTAL: 0.8 mg/dL (ref 0.2–1.2)
BUN: 13 mg/dL (ref 7–25)
CALCIUM: 9.2 mg/dL (ref 8.6–10.3)
CHLORIDE: 107 mmol/L (ref 98–110)
CO2: 27 mmol/L (ref 20–32)
Creat: 1.07 mg/dL (ref 0.70–1.33)
GLOBULIN: 3.4 g/dL (ref 1.9–3.7)
Glucose, Bld: 91 mg/dL (ref 65–99)
POTASSIUM: 4.3 mmol/L (ref 3.5–5.3)
Sodium: 139 mmol/L (ref 135–146)
Total Protein: 7 g/dL (ref 6.1–8.1)

## 2016-12-15 LAB — CBC
HCT: 40.5 % (ref 38.5–50.0)
Hemoglobin: 13.7 g/dL (ref 13.2–17.1)
MCH: 25.8 pg — ABNORMAL LOW (ref 27.0–33.0)
MCHC: 33.8 g/dL (ref 32.0–36.0)
MCV: 76.4 fL — ABNORMAL LOW (ref 80.0–100.0)
Platelets: 29 10*3/uL — ABNORMAL LOW (ref 140–400)
RBC: 5.3 10*6/uL (ref 4.20–5.80)
RDW: 21.5 % — AB (ref 11.0–15.0)
WBC: 2.2 10*3/uL — AB (ref 3.8–10.8)

## 2016-12-16 LAB — T-HELPER CELL (CD4) - (RCID CLINIC ONLY)
CD4 % Helper T Cell: 30 % — ABNORMAL LOW (ref 33–55)
CD4 T Cell Abs: 280 /uL — ABNORMAL LOW (ref 400–2700)

## 2016-12-22 LAB — HIV-1 RNA QUANT-NO REFLEX-BLD
HIV 1 RNA Quant: <20 copies/mL
HIV-1 RNA QUANT, LOG: NOT DETECTED {Log_copies}/mL

## 2016-12-31 ENCOUNTER — Ambulatory Visit (INDEPENDENT_AMBULATORY_CARE_PROVIDER_SITE_OTHER): Payer: Self-pay | Admitting: Internal Medicine

## 2016-12-31 ENCOUNTER — Encounter: Payer: Self-pay | Admitting: Internal Medicine

## 2016-12-31 DIAGNOSIS — B20 Human immunodeficiency virus [HIV] disease: Secondary | ICD-10-CM

## 2016-12-31 DIAGNOSIS — R748 Abnormal levels of other serum enzymes: Secondary | ICD-10-CM

## 2016-12-31 DIAGNOSIS — D696 Thrombocytopenia, unspecified: Secondary | ICD-10-CM

## 2016-12-31 NOTE — Assessment & Plan Note (Signed)
The abdominal ultrasound that I ordered was never done. I have reordered it today.
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
## 2016-12-31 NOTE — Assessment & Plan Note (Signed)
Unfortunately I ordered a peripheral smear to be done before this visit but it was not ordered correctly. I will reorder it so this can be done before his next visit.

## 2016-12-31 NOTE — Assessment & Plan Note (Signed)
His infection is under excellent, long-term control. He will follow-up after lab work in 6 months

## 2016-12-31 NOTE — Addendum Note (Signed)
Addended by: Michel Bickers on: 12/31/2016 11:51 AM   Modules accepted: Orders

## 2017-01-05 ENCOUNTER — Other Ambulatory Visit: Payer: Self-pay

## 2017-01-08 ENCOUNTER — Ambulatory Visit
Admission: RE | Admit: 2017-01-08 | Discharge: 2017-01-08 | Disposition: A | Discharge status: Home / Self Care | Payer: No Typology Code available for payment source | Source: Ambulatory Visit | Attending: Internal Medicine | Admitting: Internal Medicine

## 2017-01-08 DIAGNOSIS — R748 Abnormal levels of other serum enzymes: Secondary | ICD-10-CM

## 2017-02-25 ENCOUNTER — Ambulatory Visit (INDEPENDENT_AMBULATORY_CARE_PROVIDER_SITE_OTHER): Payer: Self-pay | Admitting: Internal Medicine

## 2017-02-25 ENCOUNTER — Encounter: Payer: Self-pay | Admitting: Internal Medicine

## 2017-02-25 DIAGNOSIS — R161 Splenomegaly, not elsewhere classified: Secondary | ICD-10-CM | POA: Insufficient documentation

## 2017-02-25 DIAGNOSIS — B20 Human immunodeficiency virus [HIV] disease: Secondary | ICD-10-CM

## 2017-02-25 DIAGNOSIS — K7460 Unspecified cirrhosis of liver: Secondary | ICD-10-CM | POA: Insufficient documentation

## 2017-02-25 DIAGNOSIS — D696 Thrombocytopenia, unspecified: Secondary | ICD-10-CM

## 2017-02-25 DIAGNOSIS — K7469 Other cirrhosis of liver: Secondary | ICD-10-CM

## 2017-02-25 NOTE — Progress Notes (Signed)
Patient Active Problem List   Diagnosis Date Noted  . Human immunodeficiency virus (HIV) disease ([place]) 02/06/2006    Priority: High  . Cirrhosis ([place]) 02/25/2017    Priority: Medium  . Splenomegaly 02/25/2017    Priority: Medium  . Elevated liver enzymes 06/14/2014    Priority: Medium  . Thrombocytopenia ([place]) 02/06/2006    Priority: Medium  . Headache 12/07/2012  . SEBORRHEA 04/17/2008  . WARTS, OTHER SPECIFIED VIRAL 02/06/2006  . ERECTILE DYSFUNCTION 02/06/2006  . GERD 02/06/2006    Patient's Medications  New Prescriptions   No medications on file  Previous Medications   GENVOYA 150-150-200-10 MG TABS TABLET    TAKE 1 TABLET BY MOUTH DAILY WITH BREAKFAST   MULTIPLE VITAMINS-MINERALS (MULTIVITAMIN PO)    Take by mouth.   PREZISTA 800 MG TABLET    TAKE 1 TABLET(800 MG) BY MOUTH DAILY WITH BREAKFAST  Modified Medications   No medications on file  Discontinued Medications   No medications on file    Subjective: Robert Ware for his routine follow-up visit.  As usual he never misses a single dose of his Genvoya or Prezista.  He is in to follow-up on his recent abdominal ultrasound.  In the past he has told me that he does not drink any alcohol.  Today he tells me that he occasionally has 1 or 2 beers but not to excess.  He wants to know if this could be the problem.  Review of Systems: Review of Systems  Constitutional: Negative for chills, diaphoresis, fever, malaise/fatigue and weight loss.  HENT: Negative for congestion and sore throat.   Respiratory: Negative for cough, sputum production and shortness of breath.   Cardiovascular: Negative for chest pain.  Gastrointestinal: Negative for abdominal pain, diarrhea, heartburn, nausea and vomiting.  Genitourinary: Negative for dysuria and frequency.  Musculoskeletal: Negative for joint pain and myalgias.  Skin: Negative for rash.  Neurological: Negative for dizziness and headaches.  Psychiatric/Behavioral:  Negative for depression and substance abuse. The patient is not nervous/anxious.     No past medical history on file.  Social History   Tobacco Use  . Smoking status: Never Smoker  . Smokeless tobacco: Never Used  Substance Use Topics  . Alcohol use: Yes    Alcohol/week: 0.0 oz    Comment: occassionaly  . Drug use: No    Family History  Problem Relation Age of Onset  . Stroke Father   . Diabetes Mother   . Hypertension Mother     No Known Allergies  Health Maintenance  Topic Date Due  . TETANUS/TDAP  06/19/1983  . COLONOSCOPY  06/19/2014  . INFLUENZA VACCINE  08/26/2016  . HIV Screening  Completed    Objective:  Vitals:   02/25/17 1011  BP: (!) 142/86  Pulse: 71  Temp: 98.2 F (36.8 C)  TempSrc: Oral  Weight: 218 lb (98.9 kg)  Height: 6\' 2"  (1.88 m)   Body mass index is 27.99 kg/m.  Physical Exam  Constitutional: He is oriented to person, place, and time.  He is smiling and in good spirits as usual.  HENT:  Mouth/Throat: No oropharyngeal exudate.  Eyes: Conjunctivae are normal.  Cardiovascular: Normal rate and regular rhythm.  No murmur heard. Pulmonary/Chest: Effort normal and breath sounds normal.  Abdominal: Soft. He exhibits no distension and no mass. There is no tenderness.  Musculoskeletal: Normal range of motion.  Neurological: He is alert and oriented to person, place, and time.  Skin: No rash noted.  Psychiatric: Mood and affect normal.    Lab Results Lab Results  Component Value Date   WBC 2.2 (L) 12/15/2016   HGB 13.7 12/15/2016   HCT 40.5 12/15/2016   MCV 76.4 (L) 12/15/2016   PLT 29 (L) 12/15/2016    Lab Results  Component Value Date   CREATININE 1.07 12/15/2016   BUN 13 12/15/2016   NA 139 12/15/2016   K 4.3 12/15/2016   CL 107 12/15/2016   CO2 27 12/15/2016    Lab Results  Component Value Date   ALT 55 (H) 12/15/2016   AST 71 (H) 12/15/2016   ALKPHOS 48 06/15/2016   BILITOT 0.8 12/15/2016    Lab Results    Component Value Date   CHOL 179 06/15/2016   HDL 65 06/15/2016   LDLCALC 101 (H) 06/15/2016   TRIG 63 06/15/2016   CHOLHDL 2.8 06/15/2016   Lab Results  Component Value Date   LABRPR NON REAC 06/15/2016   HIV 1 RNA Quant (copies/mL)  Date Value  12/15/2016 <20 NOT DETECTED  06/15/2016 <20 NOT DETECTED  12/17/2015 27 (H)   CD4 T Cell Abs (/uL)  Date Value  12/15/2016 280 (L)  06/15/2016 250 (L)  12/17/2015 290 (L)    Abdominal ultrasound 01/08/2017  IMPRESSION: 1. Very limited exam due to overlying bowel gas. Gallbladder and biliary system not visualized.  2. Liver has a very heterogeneous parenchymal pattern with lobular contour consistent with cirrhosis. A mass in the central portion of the liver cannot be excluded. MRI of the abdomen can be obtained for further evaluation. Portal vein not definitely visualized.  3. Splenomegaly. The spleen measures 16.9 cm with a volume of 1394 cc. Probable 1 cm splenule.   Electronically Signed   By: Marcello Moores  Register   On: 01/08/2017 13:40   Problem List Items Addressed This Visit      High   Human immunodeficiency virus (HIV) disease ([place])    His HIV is under excellent, long-term control.  He will continue his current antiretroviral regimen and follow-up in 2 months.        Medium   Cirrhosis ([place])    His recent ultrasound shows evidence of cirrhosis and splenomegaly.  This is likely the cause of his chronic thrombocytopenia and increasing liver enzymes.  The cause of his cirrhosis remains unclear.  He now tells me that he will occasionally drink alcohol but I am not convinced that he is an excessive drinker to the degree that it could cause cirrhosis.  He has no evidence of viral hepatitis.  He is not diabetic or obese.  I will see if we can arrange a gastroenterology evaluation.      Relevant Orders   Ambulatory referral to Gastroenterology   Splenomegaly   Thrombocytopenia ([place])    It is now probable that his  chronic thrombocytopenia is due to cirrhosis and splenomegaly.           Michel Bickers, MD [facility] [place] [facility] [phone] pager   [phone] cell 02/25/2017, 10:33 AM

## 2017-02-25 NOTE — Assessment & Plan Note (Signed)
It is now probable that his chronic thrombocytopenia is due to cirrhosis and splenomegaly.

## 2017-02-25 NOTE — Assessment & Plan Note (Signed)
His HIV is under excellent, long-term control.  He will continue his current antiretroviral regimen and follow-up in 2 months.

## 2017-02-25 NOTE — Assessment & Plan Note (Signed)
His recent ultrasound shows evidence of cirrhosis and splenomegaly.  This is likely the cause of his chronic thrombocytopenia and increasing liver enzymes.  The cause of his cirrhosis remains unclear.  He now tells me that he will occasionally drink alcohol but I am not convinced that he is an excessive drinker to the degree that it could cause cirrhosis.  He has no evidence of viral hepatitis.  He is not diabetic or obese.  I will see if we can arrange a gastroenterology evaluation.

## 2017-03-19 ENCOUNTER — Encounter: Payer: Self-pay | Admitting: Internal Medicine

## 2017-04-06 ENCOUNTER — Other Ambulatory Visit: Payer: Self-pay | Admitting: Internal Medicine

## 2017-04-06 DIAGNOSIS — B20 Human immunodeficiency virus [HIV] disease: Secondary | ICD-10-CM

## 2017-04-22 ENCOUNTER — Encounter: Payer: Self-pay | Admitting: Internal Medicine

## 2017-04-29 ENCOUNTER — Ambulatory Visit (INDEPENDENT_AMBULATORY_CARE_PROVIDER_SITE_OTHER): Payer: Self-pay | Admitting: Internal Medicine

## 2017-04-29 ENCOUNTER — Other Ambulatory Visit: Payer: Self-pay

## 2017-04-29 ENCOUNTER — Encounter: Payer: Self-pay | Admitting: Internal Medicine

## 2017-04-29 DIAGNOSIS — K7460 Unspecified cirrhosis of liver: Secondary | ICD-10-CM

## 2017-04-29 DIAGNOSIS — B20 Human immunodeficiency virus [HIV] disease: Secondary | ICD-10-CM

## 2017-04-29 NOTE — Assessment & Plan Note (Signed)
He has cirrhosis complicated by splenomegaly and chronic thrombocytopenia.  Because of his cirrhosis remains uncertain.  We will help him reschedule his gastroenterology evaluation.

## 2017-04-29 NOTE — Assessment & Plan Note (Signed)
His infection remains under excellent, long-term control. He will continue his current antiretroviral regimen and follow-up after lab work in 6 months. 

## 2017-04-29 NOTE — Progress Notes (Signed)
Patient Active Problem List   Diagnosis Date Noted  . Human immunodeficiency virus (HIV) disease ([place]) 02/06/2006    Priority: High  . Cirrhosis ([place]) 02/25/2017    Priority: Medium  . Splenomegaly 02/25/2017    Priority: Medium  . Elevated liver enzymes 06/14/2014    Priority: Medium  . Thrombocytopenia ([place]) 02/06/2006    Priority: Medium  . Headache 12/07/2012  . SEBORRHEA 04/17/2008  . WARTS, OTHER SPECIFIED VIRAL 02/06/2006  . ERECTILE DYSFUNCTION 02/06/2006  . GERD 02/06/2006    Patient's Medications  New Prescriptions   No medications on file  Previous Medications   GENVOYA 150-150-200-10 MG TABS TABLET    TAKE 1 TABLET BY MOUTH DAILY WITH BREAKFAST   MULTIPLE VITAMINS-MINERALS (MULTIVITAMIN PO)    Take by mouth.   PREZISTA 800 MG TABLET    TAKE 1 TABLET(800 MG) BY MOUTH DAILY WITH BREAKFAST  Modified Medications   No medications on file  Discontinued Medications   No medications on file    Subjective: Robert Ware is in for his routine HIV follow-up visit.  As usual Robert Ware never misses a single dose of his Genvoya or Prezista.  Robert Ware was not able to make his left lower gastroenterology visit because Robert Ware did not have the co-pay amount.  However, Robert Ware says that Robert Ware has it now and would like to reschedule that visit.  Review of Systems: Review of Systems  Constitutional: Negative for fever.  Gastrointestinal: Negative for abdominal pain, nausea and vomiting.    No past medical history on file.  Social History   Tobacco Use  . Smoking status: Never Smoker  . Smokeless tobacco: Never Used  Substance Use Topics  . Alcohol use: Yes    Alcohol/week: 0.0 oz    Comment: occassionaly  . Drug use: No    Family History  Problem Relation Age of Onset  . Stroke Father   . Diabetes Mother   . Hypertension Mother     No Known Allergies  Health Maintenance  Topic Date Due  . TETANUS/TDAP  06/19/1983  . COLONOSCOPY  06/19/2014  . INFLUENZA VACCINE  08/26/2017   . HIV Screening  Completed    Objective:  Vitals:   04/29/17 0921  BP: 124/81  Pulse: 69  Temp: 97.6 F (36.4 C)  TempSrc: Oral  Weight: 225 lb (102.1 kg)  Height: 6\' 2"  (1.88 m)   Body mass index is 28.89 kg/m.  Physical Exam  Constitutional: Robert Ware is oriented to person, place, and time.  Robert Ware is in good spirits as usual.  Cardiovascular: Normal rate and regular rhythm.  No murmur heard. Pulmonary/Chest: Effort normal. Robert Ware has no wheezes. Robert Ware has no rales.  Abdominal: Soft. Robert Ware exhibits no distension and no mass. There is no tenderness.  Neurological: Robert Ware is alert and oriented to person, place, and time.  Skin: No rash noted.  Psychiatric: Mood and affect normal.    Lab Results Lab Results  Component Value Date   WBC 2.2 (L) 12/15/2016   HGB 13.7 12/15/2016   HCT 40.5 12/15/2016   MCV 76.4 (L) 12/15/2016   PLT 29 (L) 12/15/2016    Lab Results  Component Value Date   CREATININE 1.07 12/15/2016   BUN 13 12/15/2016   NA 139 12/15/2016   K 4.3 12/15/2016   CL 107 12/15/2016   CO2 27 12/15/2016    Lab Results  Component Value Date   ALT 55 (H) 12/15/2016   AST 71 (H)  12/15/2016   ALKPHOS 48 06/15/2016   BILITOT 0.8 12/15/2016    Lab Results  Component Value Date   CHOL 179 06/15/2016   HDL 65 06/15/2016   LDLCALC 101 (H) 06/15/2016   TRIG 63 06/15/2016   CHOLHDL 2.8 06/15/2016   Lab Results  Component Value Date   LABRPR NON REAC 06/15/2016   HIV 1 RNA Quant (copies/mL)  Date Value  12/15/2016 <20 NOT DETECTED  06/15/2016 <20 NOT DETECTED  12/17/2015 27 (H)   CD4 T Cell Abs (/uL)  Date Value  12/15/2016 280 (L)  06/15/2016 250 (L)  12/17/2015 290 (L)     Problem List Items Addressed This Visit      High   Human immunodeficiency virus (HIV) disease ([place])    His infection remains under excellent, long-term control.  Robert Ware will continue his current antiretroviral regimen and follow-up after lab work in 6 months.      Relevant Orders   T-helper  cell (CD4)- ([facility] clinic only)   HIV 1 RNA quant-no reflex-bld   CBC   Comprehensive metabolic panel   Lipid panel   RPR     Medium   Cirrhosis ([place])    Robert Ware has cirrhosis complicated by splenomegaly and chronic thrombocytopenia.  Because of his cirrhosis remains uncertain.  We will help him reschedule his gastroenterology evaluation.      Relevant Orders   Ambulatory referral to Gastroenterology        Michel Bickers, MD [facility] for [place] [phone] pager   [phone] cell 04/29/2017, 9:37 AM

## 2017-04-30 LAB — COMPREHENSIVE METABOLIC PANEL
AG RATIO: 1.1 (calc) (ref 1.0–2.5)
ALBUMIN MSPROF: 3.5 g/dL — AB (ref 3.6–5.1)
ALT: 53 U/L — AB (ref 9–46)
AST: 77 U/L — ABNORMAL HIGH (ref 10–35)
Alkaline phosphatase (APISO): 57 U/L (ref 40–115)
BUN: 12 mg/dL (ref 7–25)
CHLORIDE: 110 mmol/L (ref 98–110)
CO2: 23 mmol/L (ref 20–32)
Calcium: 8.5 mg/dL — ABNORMAL LOW (ref 8.6–10.3)
Creat: 0.92 mg/dL (ref 0.70–1.33)
GLOBULIN: 3.2 g/dL (ref 1.9–3.7)
GLUCOSE: 93 mg/dL (ref 65–99)
Potassium: 4.6 mmol/L (ref 3.5–5.3)
SODIUM: 136 mmol/L (ref 135–146)
TOTAL PROTEIN: 6.7 g/dL (ref 6.1–8.1)
Total Bilirubin: 0.8 mg/dL (ref 0.2–1.2)

## 2017-04-30 LAB — LIPID PANEL
Cholesterol: 184 mg/dL (ref <200)
HDL: 65 mg/dL (ref >40)
LDL Cholesterol (Calc): 99 mg/dL (calc)
Non-HDL Cholesterol (Calc): 119 mg/dL (calc) (ref <130)
Total CHOL/HDL Ratio: 2.8 (calc) (ref <5.0)
Triglycerides: 102 mg/dL (ref <150)

## 2017-04-30 LAB — CBC
HCT: 38.6 % (ref 38.5–50.0)
Hemoglobin: 12.6 g/dL — ABNORMAL LOW (ref 13.2–17.1)
MCH: 24 pg — AB (ref 27.0–33.0)
MCHC: 32.6 g/dL (ref 32.0–36.0)
MCV: 73.5 fL — AB (ref 80.0–100.0)
PLATELETS: 29 10*3/uL — AB (ref 140–400)
RBC: 5.25 10*6/uL (ref 4.20–5.80)
RDW: 20.8 % — ABNORMAL HIGH (ref 11.0–15.0)
WBC: 2 10*3/uL — ABNORMAL LOW (ref 3.8–10.8)

## 2017-04-30 LAB — RPR: RPR: NONREACTIVE

## 2017-04-30 LAB — T-HELPER CELL (CD4) - (RCID CLINIC ONLY)
CD4 % Helper T Cell: 33 % (ref 33–55)
CD4 T Cell Abs: 240 /uL — ABNORMAL LOW (ref 400–2700)

## 2017-05-01 LAB — HIV-1 RNA QUANT-NO REFLEX-BLD
HIV 1 RNA QUANT: NOT DETECTED {copies}/mL
HIV-1 RNA QUANT, LOG: NOT DETECTED {Log_copies}/mL

## 2017-06-10 ENCOUNTER — Other Ambulatory Visit: Payer: Self-pay | Admitting: Internal Medicine

## 2017-06-10 DIAGNOSIS — B20 Human immunodeficiency virus [HIV] disease: Secondary | ICD-10-CM

## 2017-06-17 ENCOUNTER — Other Ambulatory Visit: Payer: Self-pay

## 2017-07-01 ENCOUNTER — Ambulatory Visit: Payer: Self-pay | Admitting: Internal Medicine

## 2017-07-13 ENCOUNTER — Other Ambulatory Visit: Payer: Self-pay | Admitting: Internal Medicine

## 2017-07-13 DIAGNOSIS — B20 Human immunodeficiency virus [HIV] disease: Secondary | ICD-10-CM

## 2017-09-08 ENCOUNTER — Ambulatory Visit: Payer: Self-pay

## 2017-09-16 ENCOUNTER — Encounter: Payer: Self-pay | Admitting: Internal Medicine

## 2017-10-13 ENCOUNTER — Other Ambulatory Visit: Payer: Self-pay

## 2017-10-13 DIAGNOSIS — D696 Thrombocytopenia, unspecified: Secondary | ICD-10-CM

## 2017-10-13 DIAGNOSIS — B20 Human immunodeficiency virus [HIV] disease: Secondary | ICD-10-CM

## 2017-10-14 LAB — T-HELPER CELL (CD4) - (RCID CLINIC ONLY)
CD4 % Helper T Cell: 28 % — ABNORMAL LOW (ref 33–55)
CD4 T CELL ABS: 260 /uL — AB (ref 400–2700)

## 2017-10-16 LAB — COMPREHENSIVE METABOLIC PANEL
AG Ratio: 1.1 (calc) (ref 1.0–2.5)
ALBUMIN MSPROF: 3.8 g/dL (ref 3.6–5.1)
ALT: 74 U/L — ABNORMAL HIGH (ref 9–46)
AST: 79 U/L — ABNORMAL HIGH (ref 10–35)
Alkaline phosphatase (APISO): 48 U/L (ref 40–115)
BUN: 14 mg/dL (ref 7–25)
CHLORIDE: 108 mmol/L (ref 98–110)
CO2: 22 mmol/L (ref 20–32)
Calcium: 9.4 mg/dL (ref 8.6–10.3)
Creat: 1 mg/dL (ref 0.70–1.33)
GLOBULIN: 3.4 g/dL (ref 1.9–3.7)
GLUCOSE: 93 mg/dL (ref 65–99)
POTASSIUM: 4.4 mmol/L (ref 3.5–5.3)
Sodium: 138 mmol/L (ref 135–146)
TOTAL PROTEIN: 7.2 g/dL (ref 6.1–8.1)
Total Bilirubin: 0.7 mg/dL (ref 0.2–1.2)

## 2017-10-16 LAB — LIPID PANEL
CHOL/HDL RATIO: 2.7 (calc) (ref <5.0)
Cholesterol: 205 mg/dL — ABNORMAL HIGH (ref <200)
HDL: 76 mg/dL (ref >40)
LDL Cholesterol (Calc): 112 mg/dL (calc) — ABNORMAL HIGH
NON-HDL CHOLESTEROL (CALC): 129 mg/dL (ref <130)
Triglycerides: 80 mg/dL (ref <150)

## 2017-10-16 LAB — PATHOLOGIST SMEAR REVIEW

## 2017-10-16 LAB — CBC
HEMATOCRIT: 39.3 % (ref 38.5–50.0)
HEMOGLOBIN: 12.5 g/dL — AB (ref 13.2–17.1)
MCH: 21.9 pg — ABNORMAL LOW (ref 27.0–33.0)
MCHC: 31.8 g/dL — AB (ref 32.0–36.0)
MCV: 68.9 fL — AB (ref 80.0–100.0)
Platelets: 34 10*3/uL — ABNORMAL LOW (ref 140–400)
RBC: 5.7 10*6/uL (ref 4.20–5.80)
RDW: 23.7 % — AB (ref 11.0–15.0)
WBC: 2.5 10*3/uL — AB (ref 3.8–10.8)

## 2017-10-16 LAB — RPR: RPR Ser Ql: NONREACTIVE

## 2017-10-16 LAB — HIV-1 RNA QUANT-NO REFLEX-BLD
HIV 1 RNA Quant: <20 copies/mL
HIV-1 RNA Quant, Log: <1.3 Log copies/mL

## 2017-10-27 ENCOUNTER — Encounter: Payer: Self-pay | Admitting: Internal Medicine

## 2017-10-28 ENCOUNTER — Encounter: Payer: Self-pay | Admitting: Internal Medicine

## 2017-10-28 ENCOUNTER — Ambulatory Visit (INDEPENDENT_AMBULATORY_CARE_PROVIDER_SITE_OTHER): Payer: Self-pay | Admitting: Internal Medicine

## 2017-10-28 VITALS — BP 128/82 | HR 76 | Temp 97.9°F | Ht 72.0 in | Wt 220.0 lb

## 2017-10-28 DIAGNOSIS — L219 Seborrheic dermatitis, unspecified: Secondary | ICD-10-CM

## 2017-10-28 DIAGNOSIS — K7460 Unspecified cirrhosis of liver: Secondary | ICD-10-CM

## 2017-10-28 DIAGNOSIS — B078 Other viral warts: Secondary | ICD-10-CM

## 2017-10-28 DIAGNOSIS — B20 Human immunodeficiency virus [HIV] disease: Secondary | ICD-10-CM

## 2017-10-28 DIAGNOSIS — Z23 Encounter for immunization: Secondary | ICD-10-CM

## 2017-10-28 MED ORDER — KETOCONAZOLE 2 % EX CREA: 1.0000 "application " | TOPICAL_CREAM | Freq: Two times a day (BID) | CUTANEOUS | 1 refills | Status: DC

## 2017-10-28 NOTE — Progress Notes (Signed)
Patient Active Problem List   Diagnosis Date Noted  . Human immunodeficiency virus (HIV) disease ([place]) 02/06/2006    Priority: High  . Cirrhosis ([place]) 02/25/2017    Priority: Medium  . Splenomegaly 02/25/2017    Priority: Medium  . Elevated liver enzymes 06/14/2014    Priority: Medium  . Thrombocytopenia ([place]) 02/06/2006    Priority: Medium  . Headache 12/07/2012  . SEBORRHEA 04/17/2008  . Other viral warts 02/06/2006  . ERECTILE DYSFUNCTION 02/06/2006  . GERD 02/06/2006    Patient's Medications  New Prescriptions   KETOCONAZOLE (NIZORAL) 2 % CREAM    Apply 1 application topically 2 (two) times daily.  Previous Medications   GENVOYA 150-150-200-10 MG TABS TABLET    TAKE 1 TABLET BY MOUTH DAILY WITH BREAKFAST   MULTIPLE VITAMINS-MINERALS (MULTIVITAMIN PO)    Take by mouth.   PREZISTA 800 MG TABLET    TAKE 1 TABLET(800 MG) BY MOUTH DAILY WITH BREAKFAST  Modified Medications   No medications on file  Discontinued Medications   No medications on file    Subjective: Robert Ware is in for his routine HIV follow-up visit.  As usual he never misses a single dose of his Genvoya or Prezista.  He is feeling well.  At the time of his last visit I tried to arrange a gastroenterology evaluation for his cirrhosis.  He was initially told by the office that he would have $120 co-pay but when he arrived at the office for his initial visit he was told he would need to pay $200.  He has noted a recurrence of his dry slightly pruritic rash on his lower abdomen.  He is also had a recurrence of his large genital warts.  He had laser removal performed by Dr. Franchot Gallo in 2007.  Review of Systems: Review of Systems  Constitutional: Negative for chills, diaphoresis, fever, malaise/fatigue and weight loss.  HENT: Negative for sore throat.   Respiratory: Negative for cough, sputum production and shortness of breath.   Cardiovascular: Negative for chest pain.  Gastrointestinal:  Negative for abdominal pain, diarrhea, heartburn, nausea and vomiting.  Genitourinary: Negative for dysuria and frequency.       Recurrent warts as noted in HPI.  Musculoskeletal: Negative for joint pain and myalgias.  Skin: Positive for itching and rash.  Neurological: Negative for dizziness and headaches.  Psychiatric/Behavioral: Negative for depression and substance abuse. The patient is not nervous/anxious.     No past medical history on file.  Social History   Tobacco Use  . Smoking status: Never Smoker  . Smokeless tobacco: Never Used  Substance Use Topics  . Alcohol use: Yes    Alcohol/week: 0.0 standard drinks    Comment: occassionaly  . Drug use: No    Family History  Problem Relation Age of Onset  . Stroke Father   . Diabetes Mother   . Hypertension Mother     No Known Allergies  Health Maintenance  Topic Date Due  . TETANUS/TDAP  06/19/1983  . COLONOSCOPY  06/19/2014  . INFLUENZA VACCINE  08/26/2017  . HIV Screening  Completed    Objective:  Vitals:   10/28/17 0844  BP: 128/82  Pulse: 76  Temp: 97.9 F (36.6 C)  Weight: 220 lb (99.8 kg)  Height: 6' (1.829 m)   Body mass index is 29.84 kg/m.  Physical Exam  Constitutional: He is oriented to person, place, and time.  He is in very good spirits as  usual.  HENT:  Mouth/Throat: No oropharyngeal exudate.  Eyes: Conjunctivae are normal.  Cardiovascular: Normal rate and regular rhythm.  No murmur heard. Pulmonary/Chest: Effort normal and breath sounds normal.  Abdominal: Soft. He exhibits no mass. There is no tenderness.  Genitourinary:  Genitourinary Comments: He has a large warts at the base of his scrotum in his right groin.  Musculoskeletal: Normal range of motion.  Neurological: He is alert and oriented to person, place, and time.  Skin: Rash noted.  He has a dry, hyperpigmented patch on his lower left abdomen.    Lab Results Lab Results  Component Value Date   WBC 2.5 (L) 10/13/2017     HGB 12.5 (L) 10/13/2017   HCT 39.3 10/13/2017   MCV 68.9 (L) 10/13/2017   PLT 34 (L) 10/13/2017    Lab Results  Component Value Date   CREATININE 1.00 10/13/2017   BUN 14 10/13/2017   NA 138 10/13/2017   K 4.4 10/13/2017   CL 108 10/13/2017   CO2 22 10/13/2017    Lab Results  Component Value Date   ALT 74 (H) 10/13/2017   AST 79 (H) 10/13/2017   ALKPHOS 48 06/15/2016   BILITOT 0.7 10/13/2017    Lab Results  Component Value Date   CHOL 205 (H) 10/13/2017   HDL 76 10/13/2017   LDLCALC 112 (H) 10/13/2017   TRIG 80 10/13/2017   CHOLHDL 2.7 10/13/2017   Lab Results  Component Value Date   LABRPR NON-REACTIVE 10/13/2017   HIV 1 RNA Quant (copies/mL)  Date Value  10/13/2017 <20 NOT DETECTED  04/29/2017 <20 NOT DETECTED  12/15/2016 <20 NOT DETECTED   CD4 T Cell Abs (/uL)  Date Value  10/13/2017 260 (L)  04/29/2017 240 (L)  12/15/2016 280 (L)     Problem List Items Addressed This Visit      High   Human immunodeficiency virus (HIV) disease ([place])    His infection remains under excellent, long-term control.  He will continue his current regimen and follow-up after lab work in 1 year.      Relevant Medications   ketoconazole (NIZORAL) 2 % cream   Other Relevant Orders   CBC   T-helper cell (CD4)- ([facility] clinic only)   Comprehensive metabolic panel   Lipid panel   RPR   HIV-1 RNA quant-no reflex-bld     Medium   Cirrhosis ([place])    He has cirrhosis of unknown cause.  I will try again to arrange gastroenterology evaluation.      Relevant Orders   Ambulatory referral to Gastroenterology     Unprioritized   SEBORRHEA    I will treat him with ketoconazole cream.      Relevant Medications   ketoconazole (NIZORAL) 2 % cream   Other viral warts    I will make a referral back to Dr. Diona Fanti.      Relevant Medications   ketoconazole (NIZORAL) 2 % cream   Other Relevant Orders   Ambulatory referral to Urology        Michel Bickers, MD [place] for [place] [phone] pager   [phone] cell 10/28/2017, 9:11 AM

## 2017-10-28 NOTE — Assessment & Plan Note (Signed)
I will make a referral back to Dr. Diona Fanti.

## 2017-10-28 NOTE — Assessment & Plan Note (Signed)
I will treat him with ketoconazole cream.

## 2017-10-28 NOTE — Assessment & Plan Note (Signed)
He has cirrhosis of unknown cause.  I will try again to arrange gastroenterology evaluation.

## 2017-10-28 NOTE — Assessment & Plan Note (Signed)
His infection remains under excellent, long-term control.  He will continue his current regimen and follow-up after lab work in 1 year.

## 2018-01-20 ENCOUNTER — Other Ambulatory Visit: Payer: Self-pay | Admitting: Internal Medicine

## 2018-01-20 DIAGNOSIS — B20 Human immunodeficiency virus [HIV] disease: Secondary | ICD-10-CM

## 2018-01-20 DIAGNOSIS — L219 Seborrheic dermatitis, unspecified: Secondary | ICD-10-CM

## 2018-03-02 ENCOUNTER — Ambulatory Visit: Payer: Self-pay

## 2018-03-16 ENCOUNTER — Encounter: Payer: Self-pay | Admitting: Internal Medicine

## 2018-06-14 ENCOUNTER — Other Ambulatory Visit: Payer: Self-pay | Admitting: Internal Medicine

## 2018-06-14 DIAGNOSIS — B20 Human immunodeficiency virus [HIV] disease: Secondary | ICD-10-CM

## 2018-06-16 ENCOUNTER — Other Ambulatory Visit: Payer: Self-pay | Admitting: Urology

## 2018-06-25 ENCOUNTER — Encounter (HOSPITAL_BASED_OUTPATIENT_CLINIC_OR_DEPARTMENT_OTHER): Payer: Self-pay

## 2018-06-27 ENCOUNTER — Other Ambulatory Visit: Payer: Self-pay

## 2018-06-27 ENCOUNTER — Encounter (HOSPITAL_BASED_OUTPATIENT_CLINIC_OR_DEPARTMENT_OTHER): Payer: Self-pay | Admitting: *Deleted

## 2018-06-27 NOTE — Progress Notes (Signed)
SPOKE WITH TONTOVI NPO AFTER MIDNIGHT, ARRIVE 945 AM 07-01-18 [place] HAS SURGERY ORDERS IN Epic COVID TEST 06-29-18 AT 930 AM (CANNOT COME 06-28-18) NO LABS NEEDED WILL ARRANGE DRIVER

## 2018-06-29 ENCOUNTER — Other Ambulatory Visit (HOSPITAL_COMMUNITY)
Admission: RE | Admit: 2018-06-29 | Discharge: 2018-06-29 | Disposition: A | Discharge status: Home / Self Care | Payer: HRSA Program | Source: Ambulatory Visit | Attending: Urology | Admitting: Urology

## 2018-06-29 ENCOUNTER — Other Ambulatory Visit: Payer: Self-pay

## 2018-06-29 DIAGNOSIS — Z1159 Encounter for screening for other viral diseases: Secondary | ICD-10-CM | POA: Insufficient documentation

## 2018-06-30 LAB — NOVEL CORONAVIRUS, NAA (HOSP ORDER, SEND-OUT TO REF LAB; TAT 18-24 HRS): SARS-CoV-2, NAA: NOT DETECTED

## 2018-06-30 NOTE — Anesthesia Preprocedure Evaluation (Addendum)
Anesthesia Evaluation  Patient identified by MRN, date of birth, ID band Patient awake    Reviewed: Allergy & Precautions, NPO status , Patient's Chart, lab work & pertinent test results  Airway Mallampati: II  TM Distance: >3 FB Neck ROM: Full    Dental no notable dental hx. (+) Teeth Intact, Dental Advisory Given   Pulmonary neg pulmonary ROS,    Pulmonary exam normal breath sounds clear to auscultation       Cardiovascular Exercise Tolerance: Good negative cardio ROS Normal cardiovascular exam Rhythm:Regular Rate:Normal     Neuro/Psych  Headaches,    GI/Hepatic Neg liver ROS, GERD  ,  Endo/Other    Renal/GU negative Renal ROS   Genital Condyloma    Musculoskeletal   Abdominal   Peds  Hematology  (+) HIV,   Anesthesia Other Findings   Reproductive/Obstetrics                          Lab Results  Component Value Date   WBC 2.5 (L) 10/13/2017   HGB 12.5 (L) 10/13/2017   HCT 39.3 10/13/2017   MCV 68.9 (L) 10/13/2017   PLT 34 (L) 10/13/2017    Lab Results  Component Value Date   CREATININE 1.00 10/13/2017   BUN 14 10/13/2017   NA 138 10/13/2017   K 4.4 10/13/2017   CL 108 10/13/2017   CO2 22 10/13/2017    Anesthesia Physical Anesthesia Plan  ASA: II  Anesthesia Plan: General   Post-op Pain Management:    Induction: Intravenous  PONV Risk Score and Plan: 3 and Treatment may vary due to age or medical condition, Ondansetron and Dexamethasone  Airway Management Planned: LMA  Additional Equipment:   Intra-op Plan:   Post-operative Plan:   Informed Consent: I have reviewed the patients History and Physical, chart, labs and discussed the procedure including the risks, benefits and alternatives for the proposed anesthesia with the patient or authorized representative who has indicated his/her understanding and acceptance.     Dental advisory given  Plan Discussed  with: CRNA  Anesthesia Plan Comments:        Anesthesia Quick Evaluation

## 2018-06-30 NOTE — Progress Notes (Signed)
SPOKE W/  Robert Ware  Reminded to self quarantine    SCREENING SYMPTOMS OF COVID 19:   COUGH--n  RUNNY NOSE--- n  SORE THROAT---n  NASAL CONGESTION----n  SNEEZING----n  SHORTNESS OF BREATH---n  DIFFICULTY BREATHING---n  TEMP >100.0 -----n  UNEXPLAINED BODY ACHES------n  CHILLS -------- n  HEADACHES ---------n  LOSS OF SMELL/ TASTE --------n    HAVE YOU OR ANY FAMILY MEMBER TRAVELLED PAST 14 DAYS OUT OF THE   COUNTY---n STATE----n COUNTRY----n  HAVE YOU OR ANY FAMILY MEMBER BEEN EXPOSED TO ANYONE WITH COVID 19?  n

## 2018-07-01 ENCOUNTER — Ambulatory Visit (HOSPITAL_BASED_OUTPATIENT_CLINIC_OR_DEPARTMENT_OTHER)
Admission: RE | Admit: 2018-07-01 | Discharge: 2018-07-01 | Disposition: A | Discharge status: Home / Self Care | Payer: Self-pay | Attending: Urology | Admitting: Urology

## 2018-07-01 ENCOUNTER — Encounter (HOSPITAL_BASED_OUTPATIENT_CLINIC_OR_DEPARTMENT_OTHER): Payer: Self-pay | Admitting: *Deleted

## 2018-07-01 ENCOUNTER — Ambulatory Visit (HOSPITAL_BASED_OUTPATIENT_CLINIC_OR_DEPARTMENT_OTHER): Payer: Self-pay | Admitting: Anesthesiology

## 2018-07-01 ENCOUNTER — Encounter (HOSPITAL_BASED_OUTPATIENT_CLINIC_OR_DEPARTMENT_OTHER)
Admission: RE | Disposition: A | Discharge status: Home / Self Care | Payer: Self-pay | Source: Home / Self Care | Attending: Urology

## 2018-07-01 ENCOUNTER — Other Ambulatory Visit: Payer: Self-pay

## 2018-07-01 DIAGNOSIS — D696 Thrombocytopenia, unspecified: Secondary | ICD-10-CM | POA: Insufficient documentation

## 2018-07-01 DIAGNOSIS — Z833 Family history of diabetes mellitus: Secondary | ICD-10-CM | POA: Insufficient documentation

## 2018-07-01 DIAGNOSIS — Z21 Asymptomatic human immunodeficiency virus [HIV] infection status: Secondary | ICD-10-CM | POA: Insufficient documentation

## 2018-07-01 DIAGNOSIS — R51 Headache: Secondary | ICD-10-CM | POA: Insufficient documentation

## 2018-07-01 DIAGNOSIS — K219 Gastro-esophageal reflux disease without esophagitis: Secondary | ICD-10-CM | POA: Insufficient documentation

## 2018-07-01 DIAGNOSIS — K7460 Unspecified cirrhosis of liver: Secondary | ICD-10-CM | POA: Insufficient documentation

## 2018-07-01 DIAGNOSIS — Z823 Family history of stroke: Secondary | ICD-10-CM | POA: Insufficient documentation

## 2018-07-01 DIAGNOSIS — Z8249 Family history of ischemic heart disease and other diseases of the circulatory system: Secondary | ICD-10-CM | POA: Insufficient documentation

## 2018-07-01 DIAGNOSIS — A630 Anogenital (venereal) warts: Secondary | ICD-10-CM | POA: Insufficient documentation

## 2018-07-01 HISTORY — DX: Seborrheic dermatitis, unspecified: L21.9

## 2018-07-01 HISTORY — DX: Anogenital (venereal) warts: A63.0

## 2018-07-01 HISTORY — DX: Viral wart, unspecified: B07.9

## 2018-07-01 HISTORY — DX: Asymptomatic human immunodeficiency virus (hiv) infection status: Z21

## 2018-07-01 HISTORY — DX: Splenomegaly, not elsewhere classified: R16.1

## 2018-07-01 HISTORY — PX: CONDYLOMA EXCISION/FULGURATION: SHX1389

## 2018-07-01 HISTORY — DX: Human immunodeficiency virus (HIV) disease: B20

## 2018-07-01 HISTORY — DX: Unspecified cirrhosis of liver: K74.60

## 2018-07-01 HISTORY — DX: Gastro-esophageal reflux disease without esophagitis: K21.9

## 2018-07-01 HISTORY — DX: Thrombocytopenia, unspecified: D69.6

## 2018-07-01 HISTORY — DX: Male erectile dysfunction, unspecified: N52.9

## 2018-07-01 HISTORY — DX: Abnormal levels of other serum enzymes: R74.8

## 2018-07-01 LAB — CBC
HCT: 35 % — ABNORMAL LOW (ref 39.0–52.0)
Hemoglobin: 11.4 g/dL — ABNORMAL LOW (ref 13.0–17.0)
MCH: 22.2 pg — ABNORMAL LOW (ref 26.0–34.0)
MCHC: 32.6 g/dL (ref 30.0–36.0)
MCV: 68.1 fL — ABNORMAL LOW (ref 80.0–100.0)
Platelets: 36 10*3/uL — ABNORMAL LOW (ref 150–400)
RBC: 5.14 MIL/uL (ref 4.22–5.81)
RDW: 20.7 % — ABNORMAL HIGH (ref 11.5–15.5)
WBC: 2 10*3/uL — ABNORMAL LOW (ref 4.0–10.5)
nRBC: 0 % (ref 0.0–0.2)

## 2018-07-01 LAB — POCT I-STAT, CHEM 8
BUN: 13 mg/dL (ref 6–20)
Calcium, Ion: 1.1 mmol/L — ABNORMAL LOW (ref 1.15–1.40)
Chloride: 106 mmol/L (ref 98–111)
Creatinine, Ser: 0.8 mg/dL (ref 0.61–1.24)
Glucose, Bld: 109 mg/dL — ABNORMAL HIGH (ref 70–99)
HCT: 39 % (ref 39.0–52.0)
Hemoglobin: 13.3 g/dL (ref 13.0–17.0)
Potassium: 4.8 mmol/L (ref 3.5–5.1)
Sodium: 138 mmol/L (ref 135–145)
TCO2: 23 mmol/L (ref 22–32)

## 2018-07-01 SURGERY — REMOVAL, CONDYLOMA
Anesthesia: General | Site: Groin

## 2018-07-01 MED ORDER — GABAPENTIN 300 MG PO CAPS
ORAL_CAPSULE | ORAL | Status: AC
  Filled 2018-07-01: qty 1

## 2018-07-01 MED ORDER — ONDANSETRON HCL 4 MG/2ML IJ SOLN
INTRAMUSCULAR | Status: DC | PRN
  Administered 2018-07-01: 4 mg via INTRAVENOUS

## 2018-07-01 MED ORDER — PROPOFOL 10 MG/ML IV BOLUS
INTRAVENOUS | Status: DC | PRN
  Administered 2018-07-01: 200 mg via INTRAVENOUS

## 2018-07-01 MED ORDER — LIDOCAINE-EPINEPHRINE 1 %-1:100000 IJ SOLN
INTRAMUSCULAR | Status: DC | PRN
  Administered 2018-07-01: 40 mL

## 2018-07-01 MED ORDER — LACTATED RINGERS IV SOLN
INTRAVENOUS | Status: DC
  Administered 2018-07-01: 14:00:00 1000 mL via INTRAVENOUS
  Administered 2018-07-01: 10:00:00 via INTRAVENOUS
  Filled 2018-07-01: qty 1000

## 2018-07-01 MED ORDER — KETOROLAC TROMETHAMINE 30 MG/ML IJ SOLN
INTRAMUSCULAR | Status: AC
  Filled 2018-07-01: qty 1

## 2018-07-01 MED ORDER — KETOROLAC TROMETHAMINE 30 MG/ML IJ SOLN
INTRAMUSCULAR | Status: DC | PRN
  Administered 2018-07-01: 30 mg via INTRAVENOUS

## 2018-07-01 MED ORDER — PROPOFOL 10 MG/ML IV BOLUS
INTRAVENOUS | Status: AC
  Filled 2018-07-01: qty 40

## 2018-07-01 MED ORDER — ONDANSETRON HCL 4 MG/2ML IJ SOLN
INTRAMUSCULAR | Status: AC
  Filled 2018-07-01: qty 2

## 2018-07-01 MED ORDER — CEPHALEXIN 500 MG PO CAPS: 500.0000 mg | ORAL_CAPSULE | Freq: Two times a day (BID) | ORAL | 0 refills | Status: DC

## 2018-07-01 MED ORDER — FENTANYL CITRATE (PF) 100 MCG/2ML IJ SOLN
INTRAMUSCULAR | Status: AC
  Filled 2018-07-01: qty 2

## 2018-07-01 MED ORDER — HYDROCODONE-ACETAMINOPHEN 7.5-325 MG PO TABS
1.0000 | ORAL_TABLET | Freq: Once | ORAL | Status: DC | PRN
  Filled 2018-07-01: qty 1

## 2018-07-01 MED ORDER — ACETAMINOPHEN 500 MG PO TABS
ORAL_TABLET | ORAL | Status: AC
  Filled 2018-07-01: qty 2

## 2018-07-01 MED ORDER — BACITRACIN-NEOMYCIN-POLYMYXIN 400-5-5000 EX OINT
TOPICAL_OINTMENT | CUTANEOUS | Status: DC | PRN
  Administered 2018-07-01: 1 via TOPICAL

## 2018-07-01 MED ORDER — FENTANYL CITRATE (PF) 100 MCG/2ML IJ SOLN
INTRAMUSCULAR | Status: DC | PRN
  Administered 2018-07-01 (×4): 50 ug via INTRAVENOUS

## 2018-07-01 MED ORDER — GABAPENTIN 300 MG PO CAPS
300.0000 mg | ORAL_CAPSULE | Freq: Once | ORAL | Status: AC
  Administered 2018-07-01: 10:00:00 300 mg via ORAL
  Filled 2018-07-01: qty 1

## 2018-07-01 MED ORDER — LIDOCAINE 2% (20 MG/ML) 5 ML SYRINGE
INTRAMUSCULAR | Status: DC | PRN
  Administered 2018-07-01: 10 mg via INTRAVENOUS

## 2018-07-01 MED ORDER — KETOROLAC TROMETHAMINE 30 MG/ML IJ SOLN
30.0000 mg | Freq: Once | INTRAMUSCULAR | Status: DC | PRN
  Filled 2018-07-01: qty 1

## 2018-07-01 MED ORDER — MIDAZOLAM HCL 5 MG/5ML IJ SOLN
INTRAMUSCULAR | Status: DC | PRN
  Administered 2018-07-01: 2 mg via INTRAVENOUS
  Administered 2018-07-01: 1 mg via INTRAVENOUS

## 2018-07-01 MED ORDER — DEXAMETHASONE SODIUM PHOSPHATE 10 MG/ML IJ SOLN
INTRAMUSCULAR | Status: DC | PRN
  Administered 2018-07-01: 10 mg via INTRAVENOUS

## 2018-07-01 MED ORDER — CEFAZOLIN SODIUM-DEXTROSE 2-4 GM/100ML-% IV SOLN
2.0000 g | INTRAVENOUS | Status: AC
  Administered 2018-07-01: 2 g via INTRAVENOUS
  Filled 2018-07-01: qty 100

## 2018-07-01 MED ORDER — ONDANSETRON HCL 4 MG/2ML IJ SOLN
4.0000 mg | Freq: Once | INTRAMUSCULAR | Status: DC | PRN
  Filled 2018-07-01: qty 2

## 2018-07-01 MED ORDER — ACETAMINOPHEN 500 MG PO TABS
1000.0000 mg | ORAL_TABLET | Freq: Once | ORAL | Status: AC
  Administered 2018-07-01: 10:00:00 1000 mg via ORAL
  Filled 2018-07-01: qty 2

## 2018-07-01 MED ORDER — DEXAMETHASONE SODIUM PHOSPHATE 10 MG/ML IJ SOLN
INTRAMUSCULAR | Status: AC
  Filled 2018-07-01: qty 2

## 2018-07-01 MED ORDER — HYDROMORPHONE HCL 1 MG/ML IJ SOLN
0.2500 mg | INTRAMUSCULAR | Status: DC | PRN
  Filled 2018-07-01: qty 0.5

## 2018-07-01 MED ORDER — MEPERIDINE HCL 25 MG/ML IJ SOLN
6.2500 mg | INTRAMUSCULAR | Status: DC | PRN
  Filled 2018-07-01: qty 1

## 2018-07-01 MED ORDER — LIDOCAINE 2% (20 MG/ML) 5 ML SYRINGE
INTRAMUSCULAR | Status: AC
  Filled 2018-07-01: qty 5

## 2018-07-01 MED ORDER — LIDOCAINE 2% (20 MG/ML) 5 ML SYRINGE
INTRAMUSCULAR | Status: AC
  Filled 2018-07-01: qty 10

## 2018-07-01 MED ORDER — MIDAZOLAM HCL 2 MG/2ML IJ SOLN
INTRAMUSCULAR | Status: AC
  Filled 2018-07-01: qty 2

## 2018-07-01 MED ORDER — DEXAMETHASONE SODIUM PHOSPHATE 10 MG/ML IJ SOLN
INTRAMUSCULAR | Status: AC
  Filled 2018-07-01: qty 1

## 2018-07-01 MED ORDER — CEFAZOLIN SODIUM-DEXTROSE 2-4 GM/100ML-% IV SOLN
INTRAVENOUS | Status: AC
  Filled 2018-07-01: qty 100

## 2018-07-01 SURGICAL SUPPLY — 31 items
BNDG GAUZE ELAST 4 BULKY (GAUZE/BANDAGES/DRESSINGS) ×2 IMPLANT
BRIEF STRETCH FOR OB PAD LRG (UNDERPADS AND DIAPERS) ×2 IMPLANT
CLOTH BEACON ORANGE TIMEOUT ST (SAFETY) ×3 IMPLANT
COVER WAND RF STERILE (DRAPES) ×3 IMPLANT
DEPRESSOR TONGUE BLADE STERILE (MISCELLANEOUS) ×3 IMPLANT
DRAPE SHEET LG 3/4 BI-LAMINATE (DRAPES) ×2 IMPLANT
DRAPE UNDERBUTTOCKS STRL (DRAPE) ×2 IMPLANT
ELECT REM PT RETURN 9FT ADLT (ELECTROSURGICAL) ×3
ELECTRODE REM PT RTRN 9FT ADLT (ELECTROSURGICAL) IMPLANT
GLOVE BIO SURGEON STRL SZ 6.5 (GLOVE) ×1 IMPLANT
GLOVE BIO SURGEON STRL SZ8 (GLOVE) ×3 IMPLANT
GLOVE BIO SURGEONS STRL SZ 6.5 (GLOVE) ×1
GLOVE BIOGEL PI IND STRL 6.5 (GLOVE) IMPLANT
GLOVE BIOGEL PI INDICATOR 6.5 (GLOVE) ×2
GOWN STRL REUS W/ TWL LRG LVL3 (GOWN DISPOSABLE) ×1 IMPLANT
GOWN STRL REUS W/ TWL XL LVL3 (GOWN DISPOSABLE) ×1 IMPLANT
GOWN STRL REUS W/TWL LRG LVL3 (GOWN DISPOSABLE) ×3
GOWN STRL REUS W/TWL XL LVL3 (GOWN DISPOSABLE) ×3
KIT TURNOVER CYSTO (KITS) ×3 IMPLANT
LEGGING LITHOTOMY PAIR STRL (DRAPES) ×2 IMPLANT
MANIFOLD NEPTUNE II (INSTRUMENTS) IMPLANT
PACK BASIN DAY SURGERY FS (CUSTOM PROCEDURE TRAY) ×3 IMPLANT
PENCIL BUTTON HOLSTER BLD 10FT (ELECTRODE) ×2 IMPLANT
SUT VIC AB 2-0 SH 27 (SUTURE) ×6
SUT VIC AB 2-0 SH 27XBRD (SUTURE) IMPLANT
SUT VIC AB 4-0 PS2 18 (SUTURE) ×4 IMPLANT
SYR 10ML LL (SYRINGE) ×2 IMPLANT
TOWEL OR 17X26 10 PK STRL BLUE (TOWEL DISPOSABLE) ×6 IMPLANT
TUBE CONNECTING 12'X1/4 (SUCTIONS) ×1
TUBE CONNECTING 12X1/4 (SUCTIONS) ×1 IMPLANT
VACUUM HOSE 7/8X10 W/ WAND (MISCELLANEOUS) ×3 IMPLANT

## 2018-07-01 NOTE — Discharge Instructions (Signed)
HOME CARE INSTRUCTIONS FOR SCROTAL PROCEDURES  Wound Care & Hygiene: You may apply an ice bag to the surgical area for the first 24 hours.  This may help decrease swelling and soreness.  You may have a dressing held in place by an athletic supporter.  You may remove the dressing in 24 hours and shower in 48 hours.  Continue to use the athletic supporter or tight briefs for at least a week. Activity: Rest today - not necessarily flat bed rest.  Just take it easy.  You should not do strenuous activities until your follow-up visit with your doctor.  You may resume light activity in 48 hours.  Return to Work:  Your doctor will advise you of this depending on the type of work you do  Diet: Drink liquids or eat a light diet this evening.  You may resume a regular diet tomorrow.  General Expectations: You may have a small amount of bleeding.  The scrotum may be swollen or bruised for about a week.  Call your Doctor if these occur:  -persistent or heavy bleeding  -temperature of 101 degrees or more  -severe pain, not relieved by your pain medication   Post Anesthesia Home Care Instructions  Activity: Get plenty of rest for the remainder of the day. A responsible individual must stay with you for 24 hours following the procedure.  For the next 24 hours, DO NOT: -Drive a car -Paediatric nurse -Drink alcoholic beverages -Take any medication unless instructed by your physician -Make any legal decisions or sign important papers.  Meals: Start with liquid foods such as gelatin or soup. Progress to regular foods as tolerated. Avoid greasy, spicy, heavy foods. If nausea and/or vomiting occur, drink only clear liquids until the nausea and/or vomiting subsides. Call your physician if vomiting continues.  Special Instructions/Symptoms: Your throat may feel dry or sore from the anesthesia or the breathing tube placed in your throat during surgery. If this causes discomfort, gargle with warm salt  water. The discomfort should disappear within 24 hours.     May take Ibuprofen after 6 PM as needed for pain.  May take Tylenol after 2 PM as needed for pain.

## 2018-07-01 NOTE — H&P (Signed)
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  He recently presented with recurrence of these.  These involve the groin crease, scrotum and penis.  He presents for excision and laser management of these.  Past Medical History:  Diagnosis Date  . Cirrhosis ([place])    PT DENIES  . Condyloma   . ED (erectile dysfunction)   . Elevated liver enzymes   . GERD (gastroesophageal reflux disease)   . HIV (human immunodeficiency virus infection) ([place])   . Seborrhea   . Splenomegaly   . Thrombocytopenia ([place])   . Warts     Past Surgical History:  Procedure Laterality Date  . CONDYLOMA EXCISION/FULGURATION  2007    Home Medications:    Allergies: No Known Allergies  Family History  Problem Relation Age of Onset  . Stroke Father   . Diabetes Mother   . Hypertension Mother     Social History:  reports that he has never smoked. He has never used smokeless tobacco. He reports current alcohol use. He reports that he does not use drugs.  ROS: A complete review of systems was performed.  All systems are negative except for pertinent findings as noted.  Physical Exam:  Vital signs in last 24 hours: Temp:  [97.8 F (36.6 C)] 97.8 F (36.6 C) (06/05 0942) Pulse Rate:  [69] 69 (06/05 0942) Resp:  [16] 16 (06/05 0942) BP: (132)/(86) 132/86 (06/05 0942) SpO2:  [99 %] 99 % (06/05 0942) Weight:  [101.8 kg] 101.8 kg (06/05 0942) Constitutional:  Alert and oriented, No acute distress Cardiovascular: Regular rate  Respiratory: Normal respiratory effort GI: Abdomen is soft, nontender, nondistended, no abdominal masses. No CVAT.  Anus and Perineum: He has 1 condylomata 1 cm in size approximately 3 cm superior to the anus. No other perianal condylomata. There are multiple condylomata on the scrotum bilaterally and in the bilateral groin creases. The largest of  these is perhaps 6 cm in length. Scrotum: No lesions. No edema. No cysts. No warts. Epididymides: Right: no spermatocele, no masses, no cysts, no tenderness, no induration, no enlargement. Left: no spermatocele, no masses, no cysts, no tenderness, no induration, no enlargement. Testes: No tenderness, no swelling, no enlargement left testes. No tenderness, no swelling, no enlargement right testes. Normal location left testes. Normal location right testes. No mass, no cyst, no varicocele, no hydrocele left testes. No mass, no cyst, no varicocele, no hydrocele right testes. Urethral Meatus: Normal size. No lesion, no wart, no discharge, no polyp. Normal location. Penis: Circumcised, no warts, no cracks. No dorsal Peyronie's plaques, no left corporal Peyronie's plaques, no right corporal Peyronie's plaques, no scarring, no warts. No balanitis, no meatal stenosis.  Lymphatic: No lymphadenopathy Neurologic: Grossly intact, no focal deficits Psychiatric: Normal mood and affect  Laboratory Data:  No results for input(s): WBC, HGB, HCT, PLT in the last 72 hours.  No results for input(s): NA, K, CL, GLUCOSE, BUN, CALCIUM, CREATININE in the last 72 hours.  Invalid input(s): CO3   No results found for this or any previous visit (from the past 24 hour(s)). Recent Results (from the past 240 hour(s))  Novel Coronavirus, NAA (hospital order; send-out to ref lab)     Status: None   Collection Time: 06/29/18  9:38 AM  Result Value Ref Range Status   SARS-CoV-2, NAA NOT DETECTED NOT DETECTED Final    Comment: (NOTE) This  test was developed and its performance characteristics determined by [employer]. This test has not been FDA cleared or approved. This test has been authorized by FDA under an Emergency Use Authorization (EUA). This test is only authorized for the duration of time the declaration that circumstances exist justifying the authorization of the emergency use of in vitro diagnostic tests  for detection of SARS-CoV-2 virus and/or diagnosis of COVID-19 infection under section 564(b)(1) of the Act, 21 U.S.C. 166MAY-0(K)(5), unless the authorization is terminated or revoked sooner. When diagnostic testing is negative, the possibility of a false negative result should be considered in the context of a patient's recent exposures and the presence of clinical signs and symptoms consistent with COVID-19. An individual without symptoms of COVID-19 and who is not shedding SARS-CoV-2 virus would expect to have a negative (not detected) result in this assay. Performed  At: [facility] [address] [place], [place] [id number] Rush Farmer MD TR:[id number]    [place]  Final    Comment: Performed at [facility], [place] [address] [place], [place] [address]    Renal Function: No results for input(s): CREATININE in the last 168 hours. CrCl cannot be calculated (Patient's most recent lab result is older than the maximum 21 days allowed.).  Radiologic Imaging: No results found.  Impression/Assessment:    Multiple large genital condylomata  Plan:   excision/ laser management of these

## 2018-07-01 NOTE — Anesthesia Procedure Notes (Signed)
Procedure Name: LMA Insertion Date/Time: 07/01/2018 12:08 PM Performed by: Bonney Aid, CRNA Pre-anesthesia Checklist: Patient identified, Emergency Drugs available, Suction available and Patient being monitored Patient Re-evaluated:Patient Re-evaluated prior to induction Oxygen Delivery Method: Circle system utilized Preoxygenation: Pre-oxygenation with 100% oxygen Induction Type: IV induction Ventilation: Mask ventilation without difficulty LMA: LMA inserted LMA Size: 5.0 Number of attempts: 1 Airway Equipment and Method: Bite block Placement Confirmation: positive ETCO2 Tube secured with: Tape Dental Injury: Teeth and Oropharynx as per pre-operative assessment

## 2018-07-01 NOTE — Transfer of Care (Signed)
Immediate Anesthesia Transfer of Care Note  Patient: Robert Ware  Procedure(s) Performed: Procedure(s) (LRB): CONDYLOMA REMOVAL (N/A)  Patient Location: PACU  Anesthesia Type: General  Level of Consciousness: awake, oriented, sedated and patient cooperative  Airway & Oxygen Therapy: Patient Spontanous Breathing and Patient connected to face mask oxygen  Post-op Assessment: Report given to PACU RN and Post -op Vital signs reviewed and stable  Post vital signs: Reviewed and stable  Complications: No apparent anesthesia complications  Last Vitals:  Vitals Value Taken Time  BP 122/81 07/01/2018  1:05 PM  Temp    Pulse 76 07/01/2018  1:08 PM  Resp 16 07/01/2018  1:08 PM  SpO2 95 % 07/01/2018  1:08 PM  Vitals shown include unvalidated device data.  Last Pain:  Vitals:   07/01/18 1030  TempSrc:   PainSc: 0-No pain      Patients Stated Pain Goal: 9 (07/01/18 1030)

## 2018-07-01 NOTE — Interval H&P Note (Signed)
History and Physical Interval Note:  07/01/2018 11:40 AM  Robert Ware  has presented today for surgery, with the diagnosis of [place].  The various methods of treatment have been discussed with the patient and family. After consideration of risks, benefits and other options for treatment, the patient has consented to  Procedure(s): CONDYLOMA REMOVAL (N/A) as a surgical intervention.  The patient's history has been reviewed, patient examined, no change in status, stable for surgery.  I have reviewed the patient's chart and labs.  Questions were answered to the patient's satisfaction.     [name]

## 2018-07-01 NOTE — Anesthesia Postprocedure Evaluation (Signed)
Anesthesia Post Note  Patient: Robert Ware  Procedure(s) Performed: CONDYLOMA REMOVAL (N/A Groin)     Patient location during evaluation: PACU Anesthesia Type: General Level of consciousness: awake and alert Pain management: pain level controlled Vital Signs Assessment: post-procedure vital signs reviewed and stable Respiratory status: spontaneous breathing, nonlabored ventilation, respiratory function stable and patient connected to nasal cannula oxygen Cardiovascular status: blood pressure returned to baseline and stable Postop Assessment: no apparent nausea or vomiting Anesthetic complications: no    Last Vitals:  Vitals:   07/01/18 1358 07/01/18 1400  BP:  (!) 128/91  Pulse: 73 72  Resp: 17 16  Temp:    SpO2: 94% 95%    Last Pain:  Vitals:   07/01/18 1358  TempSrc:   PainSc: Asleep                 Barnet Glasgow

## 2018-07-04 ENCOUNTER — Encounter (HOSPITAL_BASED_OUTPATIENT_CLINIC_OR_DEPARTMENT_OTHER): Payer: Self-pay | Admitting: Urology

## 2018-07-04 NOTE — Op Note (Signed)
Preoperative diagnosis: Large genital condylomata  Postop diagnosis: Same    Principle procedure:  Excision of multiple genital condylomata:   1.  10 cm  By 4.5 x 2.5 cm     2. 2.3 x 1.5 x 1.4 cm     3. 3.4 by  1.8  X 0.7  Cm        4.  3.6 x 2.0 x 1.2 cm   Surgeon:  Aricela Bertagnolli  Anesthesia:  General with LMA   Complications:  None  Specimens: Multiple large genital condylomata to pathology  Estimated blood loss:  Less than 50 mL   Drains:  None   Indications:  53 year old male who is several years out from excision of multiple large genital condylomata.  He recently presented with recurrence of these.  They are quite large, and will   Require excision.  Additionally, he has 1 in his buttock crease that can be treated with cautery.  I have discussed procedure with the patient, expected risks and complications as well as outcomes.  He understands these and desires to proceed.    Description of procedure:  The patient was properly identified in the holding area and received preoperative IV antibiotics.  He was taken to the operating room where general anesthetic was administered with the LMA.  He was placed in the dorsal lithotomy position.  Genitalia and perineum were prepped and draped.  Proper time-out was performed.    1% lidocaine with epinephrine was used to infiltrate the subcutaneous tissue under each of these very large condylomata.  Following this, I used a knife to excise, in an elliptical fashion, all of the above condylomata.  Excision was taken down to deep subcutaneous tissue.  Specimens were all sent separately for permanent section.  I then reapproximated the subcutaneous tissue 1st with 2 0 Vicryl placed in simple fashion.  Skin edges for all these incisions were then closed with a running suture of 4 0 Vicryl.  The 1 condyloma in the buttock crease was cauterized.  At this point, all wounds were hemostatic.  Fluffs were placed and mesh underpants placed as well.  The patient was  then awakened and taken to the PACU in stable condition.  He tolerated the procedure well.  He sponge needle and instrument counts were correct x2.

## 2018-07-28 ENCOUNTER — Emergency Department (HOSPITAL_BASED_OUTPATIENT_CLINIC_OR_DEPARTMENT_OTHER): Payer: Self-pay

## 2018-07-28 ENCOUNTER — Encounter (HOSPITAL_COMMUNITY): Payer: Self-pay | Admitting: Emergency Medicine

## 2018-07-28 ENCOUNTER — Emergency Department (HOSPITAL_COMMUNITY)
Admission: EM | Admit: 2018-07-28 | Discharge: 2018-07-28 | Disposition: A | Discharge status: Home / Self Care | Payer: Self-pay | Attending: Emergency Medicine | Admitting: Emergency Medicine

## 2018-07-28 ENCOUNTER — Other Ambulatory Visit: Payer: Self-pay

## 2018-07-28 DIAGNOSIS — M7122 Synovial cyst of popliteal space [Baker], left knee: Secondary | ICD-10-CM | POA: Insufficient documentation

## 2018-07-28 DIAGNOSIS — Z79899 Other long term (current) drug therapy: Secondary | ICD-10-CM | POA: Insufficient documentation

## 2018-07-28 DIAGNOSIS — R52 Pain, unspecified: Secondary | ICD-10-CM

## 2018-07-28 DIAGNOSIS — M7989 Other specified soft tissue disorders: Secondary | ICD-10-CM

## 2018-07-28 DIAGNOSIS — R600 Localized edema: Secondary | ICD-10-CM | POA: Insufficient documentation

## 2018-07-28 DIAGNOSIS — B20 Human immunodeficiency virus [HIV] disease: Secondary | ICD-10-CM | POA: Insufficient documentation

## 2018-07-28 LAB — COMPREHENSIVE METABOLIC PANEL
ALT: 49 U/L — ABNORMAL HIGH (ref 0–44)
AST: 69 U/L — ABNORMAL HIGH (ref 15–41)
Albumin: 3.2 g/dL — ABNORMAL LOW (ref 3.5–5.0)
Alkaline Phosphatase: 54 U/L (ref 38–126)
Anion gap: 7 (ref 5–15)
BUN: 10 mg/dL (ref 6–20)
CO2: 23 mmol/L (ref 22–32)
Calcium: 8.6 mg/dL — ABNORMAL LOW (ref 8.9–10.3)
Chloride: 108 mmol/L (ref 98–111)
Creatinine, Ser: 0.96 mg/dL (ref 0.61–1.24)
GFR calc Af Amer: >60 mL/min (ref >60)
GFR calc non Af Amer: >60 mL/min (ref >60)
Glucose, Bld: 106 mg/dL — ABNORMAL HIGH (ref 70–99)
Potassium: 4.1 mmol/L (ref 3.5–5.1)
Sodium: 138 mmol/L (ref 135–145)
Total Bilirubin: 1 mg/dL (ref 0.3–1.2)
Total Protein: 6.6 g/dL (ref 6.5–8.1)

## 2018-07-28 LAB — CBC WITH DIFFERENTIAL/PLATELET
Abs Immature Granulocytes: 0.01 10*3/uL (ref 0.00–0.07)
Basophils Absolute: 0 10*3/uL (ref 0.0–0.1)
Basophils Relative: 0 %
Eosinophils Absolute: 0.1 10*3/uL (ref 0.0–0.5)
Eosinophils Relative: 2 %
HCT: 34 % — ABNORMAL LOW (ref 39.0–52.0)
Hemoglobin: 11.3 g/dL — ABNORMAL LOW (ref 13.0–17.0)
Immature Granulocytes: 0 %
Lymphocytes Relative: 28 %
Lymphs Abs: 0.6 10*3/uL — ABNORMAL LOW (ref 0.7–4.0)
MCH: 22.3 pg — ABNORMAL LOW (ref 26.0–34.0)
MCHC: 33.2 g/dL (ref 30.0–36.0)
MCV: 67.1 fL — ABNORMAL LOW (ref 80.0–100.0)
Monocytes Absolute: 0.2 10*3/uL (ref 0.1–1.0)
Monocytes Relative: 10 %
Neutro Abs: 1.3 10*3/uL — ABNORMAL LOW (ref 1.7–7.7)
Neutrophils Relative %: 60 %
Platelets: 34 10*3/uL — ABNORMAL LOW (ref 150–400)
RBC: 5.07 MIL/uL (ref 4.22–5.81)
RDW: 21.2 % — ABNORMAL HIGH (ref 11.5–15.5)
WBC: 2.3 10*3/uL — ABNORMAL LOW (ref 4.0–10.5)
nRBC: 0 % (ref 0.0–0.2)

## 2018-07-28 MED ORDER — ACETAMINOPHEN 500 MG PO TABS: 500.0000 mg | ORAL_TABLET | Freq: Four times a day (QID) | ORAL | 0 refills | Status: DC | PRN

## 2018-07-28 NOTE — ED Notes (Signed)
Patient verbalizes understanding of discharge instructions. Opportunity for questioning and answers were provided. Armband removed by staff, pt discharged from ED.  

## 2018-07-28 NOTE — ED Provider Notes (Signed)
[facility] EMERGENCY DEPARTMENT Provider Note   CSN: [id number] Arrival date & time: 07/28/18  0900    History   Chief Complaint Chief Complaint  Patient presents with  . Leg Swelling    HPI Robert Ware is a 53 y.o. male with history of HIV, GERD, elevated LFTs who presents with a one-week history of left lower extremity swelling and pain.  Patient denies any fever, chest pain, shortness of breath, injury to the leg, abdominal pain, nausea, vomiting.  Patient has not taken any medications for symptoms at home.  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  He also just had a surgery on 07/01/2018 of an excision of a genital condyloma by urology.  Patient followed up with the appointment today and showed him his leg and they sent him here for evaluation.  Patient has no history of blood clots.     HPI  Past Medical History:  Diagnosis Date  . Cirrhosis ([place])    PT DENIES  . Condyloma   . ED (erectile dysfunction)   . Elevated liver enzymes   . GERD (gastroesophageal reflux disease)   . HIV (human immunodeficiency virus infection) ([place])   . Seborrhea   . Splenomegaly   . Thrombocytopenia ([place])   . Warts     Patient Active Problem List   Diagnosis Date Noted  . Cirrhosis ([place]) 02/25/2017  . Splenomegaly 02/25/2017  . Elevated liver enzymes 06/14/2014  . Headache 12/07/2012  . SEBORRHEA 04/17/2008  . Human immunodeficiency virus (HIV) disease ([place]) 02/06/2006  . Other viral warts 02/06/2006  . Thrombocytopenia ([place]) 02/06/2006  . ERECTILE DYSFUNCTION 02/06/2006  . GERD 02/06/2006    Past Surgical History:  Procedure Laterality Date  . CONDYLOMA EXCISION/FULGURATION  2007  . CONDYLOMA EXCISION/FULGURATION N/A 07/01/2018   Procedure: CONDYLOMA REMOVAL;  Surgeon: Franchot Gallo, MD;  Location: [facility];  Service: Urology;  Laterality: N/A;        Home Medications    Prior to Admission medications   Medication  Sig Start Date End Date Taking? Authorizing Provider  Cyanocobalamin (VITAMIN B-12 PO) Take 1 tablet by mouth daily.   Yes [provider]  GENVOYA 150-150-200-10 MG TABS tablet TAKE 1 TABLET BY MOUTH DAILY WITH BREAKFAST 06/14/18  Yes Michel Bickers, MD  PREZISTA 800 MG tablet TAKE 1 TABLET(800 MG) BY MOUTH DAILY WITH BREAKFAST Patient taking differently: Take 800 mg by mouth daily.  06/14/18  Yes Michel Bickers, MD  acetaminophen (TYLENOL) 500 MG tablet Take 1 tablet (500 mg total) by mouth every 6 (six) hours as needed. 07/28/18   Trinnity Breunig, Bea Graff, PA-C  cephALEXin (KEFLEX) 500 MG capsule Take 1 capsule (500 mg total) by mouth 2 (two) times daily. Patient not taking: Reported on 07/28/2018 07/01/18   Franchot Gallo, MD    Family History Family History  Problem Relation Age of Onset  . Stroke Father   . Diabetes Mother   . Hypertension Mother     Social History Social History   Tobacco Use  . Smoking status: Never Smoker  . Smokeless tobacco: Never Used  Substance Use Topics  . Alcohol use: Yes    Alcohol/week: 0.0 standard drinks    Comment: occassionaly  . Drug use: No     Allergies   Patient has no known allergies.   Review of Systems Review of Systems  Constitutional: Negative for chills and fever.  HENT: Negative for facial swelling and sore throat.  Respiratory: Negative for shortness of breath.   Cardiovascular: Positive for leg swelling. Negative for chest pain.  Gastrointestinal: Negative for abdominal pain, nausea and vomiting.  Genitourinary: Negative for dysuria.  Musculoskeletal: Negative for back pain.  Skin: Negative for rash and wound.  Neurological: Negative for headaches.  Psychiatric/Behavioral: The patient is not nervous/anxious.      Physical Exam Updated Vital Signs BP (!) 139/95 (BP Location: Right Arm)   Pulse 77   Temp 98.3 F (36.8 C) (Oral)   Resp 18   SpO2 98%   Physical Exam Vitals signs and nursing note reviewed.   Constitutional:      General: He is not in acute distress.    Appearance: He is well-developed. He is not diaphoretic.  HENT:     Head: Normocephalic and atraumatic.     Mouth/Throat:     Pharynx: No oropharyngeal exudate.  Eyes:     General: No scleral icterus.       Right eye: No discharge.        Left eye: No discharge.     Conjunctiva/sclera: Conjunctivae normal.     Pupils: Pupils are equal, round, and reactive to light.  Neck:     Musculoskeletal: Normal range of motion and neck supple.     Thyroid: No thyromegaly.  Cardiovascular:     Rate and Rhythm: Normal rate and regular rhythm.     Heart sounds: Normal heart sounds. No murmur. No friction rub. No gallop.   Pulmonary:     Effort: Pulmonary effort is normal. No respiratory distress.     Breath sounds: Normal breath sounds. No stridor. No wheezing or rales.  Abdominal:     General: Bowel sounds are normal. There is no distension.     Palpations: Abdomen is soft.     Tenderness: There is no abdominal tenderness. There is no guarding or rebound.  Musculoskeletal:     Left lower leg: Edema present.     Comments: Significant edema to the left foot extending to the calf with posterior calf tenderness, no erythema noted; 2+ edema  Lymphadenopathy:     Cervical: No cervical adenopathy.  Skin:    General: Skin is warm and dry.     Coloration: Skin is not pale.     Findings: No rash.  Neurological:     Mental Status: He is alert.     Coordination: Coordination normal.      ED Treatments / Results  Labs (all labs ordered are listed, but only abnormal results are displayed) Labs Reviewed  CBC WITH DIFFERENTIAL/PLATELET - Abnormal; Notable for the following components:      Result Value   WBC 2.3 (*)    Hemoglobin 11.3 (*)    HCT 34.0 (*)    MCV 67.1 (*)    MCH 22.3 (*)    RDW 21.2 (*)    Platelets 34 (*)    Neutro Abs 1.3 (*)    Lymphs Abs 0.6 (*)    All other components within normal limits  COMPREHENSIVE  METABOLIC PANEL - Abnormal; Notable for the following components:   Glucose, Bld 106 (*)    Calcium 8.6 (*)    Albumin 3.2 (*)    AST 69 (*)    ALT 49 (*)    All other components within normal limits    EKG None  Radiology Vas [place] Lower Extremity Venous (dvt) (only Mc & Wl 7a-7p)  Result Date: 07/28/2018  Lower Venous Study Indications: Swelling, and Pain.  Comparison Study: No prior Performing Technologist: Abram Sander RVS  Examination Guidelines: A complete evaluation includes B-mode imaging, spectral Doppler, color Doppler, and power Doppler as needed of all accessible portions of each vessel. Bilateral testing is considered an integral part of a complete examination. Limited examinations for reoccurring indications may be performed as noted.  +---------+---------------+---------+-----------+----------+--------------+ LEFT     CompressibilityPhasicitySpontaneityPropertiesSummary        +---------+---------------+---------+-----------+----------+--------------+ CFV      Full                                                        +---------+---------------+---------+-----------+----------+--------------+ SFJ      Full                                                        +---------+---------------+---------+-----------+----------+--------------+ FV Prox  Full                                                        +---------+---------------+---------+-----------+----------+--------------+ FV Mid   Full                                                        +---------+---------------+---------+-----------+----------+--------------+ FV DistalFull                                                        +---------+---------------+---------+-----------+----------+--------------+ PFV      Full                                                        +---------+---------------+---------+-----------+----------+--------------+ POP      Full                                                         +---------+---------------+---------+-----------+----------+--------------+ PTV      Full                                                        +---------+---------------+---------+-----------+----------+--------------+ PERO  Not visualized +---------+---------------+---------+-----------+----------+--------------+     Summary: Left: There is no evidence of deep vein thrombosis in the lower extremity. A cystic structure is found in the popliteal fossa.  *See table(s) above for measurements and observations.    Preliminary     Procedures Procedures (including critical care time)  Medications Ordered in ED Medications - No data to display   Initial Impression / Assessment and Plan / ED Course  I have reviewed the triage vital signs and the nursing notes.  Pertinent labs & imaging results that were available during my care of the patient were reviewed by me and considered in my medical decision making (see chart for details).        Patient presenting with a one-week history of left lower extremity swelling.  DVT ultrasound is negative for DVT, but does show Baker's cyst.  I suspect it is ruptured considering foot and ankle swelling as well as a crescent sign at the ankle.  Patient will be given knee sleeve.  NSAIDs contraindicated with patient's HIV medication, will do Tylenol as needed for pain.  Follow-up to orthopedics for further evaluation and treatment.  Return precautions discussed.  Patient understands and agrees with plan.  Patient vitals stable throughout ED course and discharged in satisfactory condition.  Final Clinical Impressions(s) / ED Diagnoses   Final diagnoses:  Baker's cyst of knee, left    ED Discharge Orders         Ordered    acetaminophen (TYLENOL) 500 MG tablet  Every 6 hours PRN     07/28/18 [address], PA-C 07/28/18 1120    Lacretia Leigh, MD 07/31/18 [phone]

## 2018-07-28 NOTE — ED Triage Notes (Signed)
Patient arrived from home with reports of left lower extremity swelling and pain. He reports that it started one week ago, he is a delivery driver.

## 2018-07-28 NOTE — Progress Notes (Signed)
Lower extremity venous has been completed.   Preliminary results in CV Proc.   Results given to RN and PA.  Abram Sander 07/28/2018 10:19 AM

## 2018-07-28 NOTE — Discharge Instructions (Signed)
You can take Tylenol as prescribed, as needed for pain.  Elevate your leg whenever you are not walking on it.  Wear your knee sleeve for support.  Please follow-up with Dr. Marlou Sa, with orthopedics, for further evaluation and treatment of your Baker's cyst.  Please return to the emergency department if you develop any new or worsening symptoms.

## 2018-07-28 NOTE — ED Notes (Signed)
Vascular bedside

## 2018-09-28 ENCOUNTER — Ambulatory Visit: Payer: Self-pay

## 2018-09-28 ENCOUNTER — Other Ambulatory Visit: Payer: Self-pay

## 2018-10-10 ENCOUNTER — Encounter: Payer: Self-pay | Admitting: Internal Medicine

## 2018-10-25 ENCOUNTER — Other Ambulatory Visit: Payer: Self-pay

## 2018-10-25 DIAGNOSIS — B20 Human immunodeficiency virus [HIV] disease: Secondary | ICD-10-CM

## 2018-10-26 LAB — T-HELPER CELL (CD4) - (RCID CLINIC ONLY)
CD4 % Helper T Cell: 39 % (ref 33–65)
CD4 T Cell Abs: 224 /uL — ABNORMAL LOW (ref 400–1790)

## 2018-10-28 LAB — COMPREHENSIVE METABOLIC PANEL
AG Ratio: 1.1 (calc) (ref 1.0–2.5)
ALT: 49 U/L — ABNORMAL HIGH (ref 9–46)
AST: 74 U/L — ABNORMAL HIGH (ref 10–35)
Albumin: 3.5 g/dL — ABNORMAL LOW (ref 3.6–5.1)
Alkaline phosphatase (APISO): 55 U/L (ref 35–144)
BUN: 10 mg/dL (ref 7–25)
CO2: 24 mmol/L (ref 20–32)
Calcium: 8.4 mg/dL — ABNORMAL LOW (ref 8.6–10.3)
Chloride: 110 mmol/L (ref 98–110)
Creat: 0.8 mg/dL (ref 0.70–1.33)
Globulin: 3.2 g/dL (calc) (ref 1.9–3.7)
Glucose, Bld: 96 mg/dL (ref 65–99)
Potassium: 4.2 mmol/L (ref 3.5–5.3)
Sodium: 139 mmol/L (ref 135–146)
Total Bilirubin: 0.8 mg/dL (ref 0.2–1.2)
Total Protein: 6.7 g/dL (ref 6.1–8.1)

## 2018-10-28 LAB — CBC
HCT: 34.4 % — ABNORMAL LOW (ref 38.5–50.0)
Hemoglobin: 11 g/dL — ABNORMAL LOW (ref 13.2–17.1)
MCH: 22.8 pg — ABNORMAL LOW (ref 27.0–33.0)
MCHC: 32 g/dL (ref 32.0–36.0)
MCV: 71.2 fL — ABNORMAL LOW (ref 80.0–100.0)
Platelets: 30 10*3/uL — ABNORMAL LOW (ref 140–400)
RBC: 4.83 10*6/uL (ref 4.20–5.80)
RDW: 22.3 % — ABNORMAL HIGH (ref 11.0–15.0)
WBC: 2 10*3/uL — ABNORMAL LOW (ref 3.8–10.8)

## 2018-10-28 LAB — LIPID PANEL
Cholesterol: 173 mg/dL (ref <200)
HDL: 58 mg/dL (ref >=40)
LDL Cholesterol (Calc): 96 mg/dL (calc)
Non-HDL Cholesterol (Calc): 115 mg/dL (calc) (ref <130)
Total CHOL/HDL Ratio: 3 (calc) (ref <5.0)
Triglycerides: 101 mg/dL (ref <150)

## 2018-10-28 LAB — HIV-1 RNA QUANT-NO REFLEX-BLD
HIV 1 RNA Quant: <20 copies/mL
HIV-1 RNA Quant, Log: <1.3 Log copies/mL

## 2018-10-28 LAB — RPR: RPR Ser Ql: NONREACTIVE

## 2018-11-08 ENCOUNTER — Ambulatory Visit (INDEPENDENT_AMBULATORY_CARE_PROVIDER_SITE_OTHER): Payer: Self-pay | Admitting: Internal Medicine

## 2018-11-08 ENCOUNTER — Other Ambulatory Visit: Payer: Self-pay

## 2018-11-08 ENCOUNTER — Encounter: Payer: Self-pay | Admitting: Internal Medicine

## 2018-11-08 DIAGNOSIS — Z23 Encounter for immunization: Secondary | ICD-10-CM

## 2018-11-08 DIAGNOSIS — A630 Anogenital (venereal) warts: Secondary | ICD-10-CM

## 2018-11-08 DIAGNOSIS — K7469 Other cirrhosis of liver: Secondary | ICD-10-CM

## 2018-11-08 DIAGNOSIS — B20 Human immunodeficiency virus [HIV] disease: Secondary | ICD-10-CM

## 2018-11-08 NOTE — Progress Notes (Signed)
Patient Active Problem List   Diagnosis Date Noted  . Human immunodeficiency virus (HIV) disease ([place]) 02/06/2006    Priority: High  . Cirrhosis ([place]) 02/25/2017    Priority: Medium  . Splenomegaly 02/25/2017    Priority: Medium  . Elevated liver enzymes 06/14/2014    Priority: Medium  . Thrombocytopenia ([place]) 02/06/2006    Priority: Medium  . Headache 12/07/2012  . SEBORRHEA 04/17/2008  . Genital warts 02/06/2006  . ERECTILE DYSFUNCTION 02/06/2006  . GERD 02/06/2006    Patient's Medications  New Prescriptions   No medications on file  Previous Medications   ACETAMINOPHEN (TYLENOL) 500 MG TABLET    Take 1 tablet (500 mg total) by mouth every 6 (six) hours as needed.   CYANOCOBALAMIN (VITAMIN B-12 PO)    Take 1 tablet by mouth daily.   GENVOYA 150-150-200-10 MG TABS TABLET    TAKE 1 TABLET BY MOUTH DAILY WITH BREAKFAST   PREZISTA 800 MG TABLET    TAKE 1 TABLET(800 MG) BY MOUTH DAILY WITH BREAKFAST  Modified Medications   No medications on file  Discontinued Medications   CEPHALEXIN (KEFLEX) 500 MG CAPSULE    Take 1 capsule (500 mg total) by mouth 2 (two) times daily.    Subjective: Kaleth is in for his routine HIV follow-up visit.  As usual he never misses a single dose of his Genvoya or Prezista.  He is feeling well.  He recently had surgery for recurrent genital warts.  Postoperatively he developed some left leg swelling and was told that he did not have a blood clot.  The swelling is getting better on its own.  He has been unable to complete his gastroenterology evaluation for cirrhosis of unknown cause because of the large co-pay.   Review of Systems: Review of Systems  Constitutional: Negative for chills, diaphoresis, fever, malaise/fatigue and weight loss.  HENT: Negative for sore throat.   Respiratory: Negative for cough, sputum production and shortness of breath.   Cardiovascular: Negative for chest pain.  Gastrointestinal: Negative for abdominal  pain, diarrhea, heartburn, nausea and vomiting.  Genitourinary: Negative for dysuria and frequency.  Musculoskeletal: Negative for joint pain and myalgias.       Left calf swelling.  Skin: Negative for itching and rash.  Neurological: Negative for dizziness and headaches.  Psychiatric/Behavioral: Negative for depression and substance abuse. The patient is not nervous/anxious.     Past Medical History:  Diagnosis Date  . Cirrhosis ([place])    PT DENIES  . Condyloma   . ED (erectile dysfunction)   . Elevated liver enzymes   . GERD (gastroesophageal reflux disease)   . HIV (human immunodeficiency virus infection) ([place])   . Seborrhea   . Splenomegaly   . Thrombocytopenia ([place])   . Warts     Social History   Tobacco Use  . Smoking status: Never Smoker  . Smokeless tobacco: Never Used  Substance Use Topics  . Alcohol use: Yes    Alcohol/week: 0.0 standard drinks    Comment: occassionaly  . Drug use: No    Family History  Problem Relation Age of Onset  . Stroke Father   . Diabetes Mother   . Hypertension Mother     No Known Allergies  Health Maintenance  Topic Date Due  . TETANUS/TDAP  06/19/1983  . COLONOSCOPY  06/19/2014  . INFLUENZA VACCINE  08/27/2018  . HIV Screening  Completed    Objective:  Vitals:   11/08/18  0854  BP: (!) 156/103  Pulse: 69  Temp: 98.2 F (36.8 C)  TempSrc: Oral  SpO2: 100%   There is no height or weight on file to calculate BMI.  Physical Exam Constitutional:      Comments: He is in good spirits.  HENT:     Mouth/Throat:     Pharynx: No oropharyngeal exudate.  Eyes:     Conjunctiva/sclera: Conjunctivae normal.  Cardiovascular:     Rate and Rhythm: Normal rate and regular rhythm.     Heart sounds: No murmur.  Pulmonary:     Effort: Pulmonary effort is normal.     Breath sounds: Normal breath sounds.  Abdominal:     Palpations: Abdomen is soft. There is no mass.     Tenderness: There is no abdominal tenderness.   Musculoskeletal: Normal range of motion.     Comments: His left calf is slightly swollen diffusely.  There are no palpable cords, erythema warmth or pain on palpation.  Skin:    Findings: Rash present.     Comments: He has a dry, hyperpigmented patch on his lower left abdomen.  Neurological:     Mental Status: He is alert and oriented to person, place, and time.     Lab Results Lab Results  Component Value Date   WBC 2.0 (L) 10/25/2018   HGB 11.0 (L) 10/25/2018   HCT 34.4 (L) 10/25/2018   MCV 71.2 (L) 10/25/2018   PLT 30 (L) 10/25/2018    Lab Results  Component Value Date   CREATININE 0.80 10/25/2018   BUN 10 10/25/2018   NA 139 10/25/2018   K 4.2 10/25/2018   CL 110 10/25/2018   CO2 24 10/25/2018    Lab Results  Component Value Date   ALT 49 (H) 10/25/2018   AST 74 (H) 10/25/2018   ALKPHOS 54 07/28/2018   BILITOT 0.8 10/25/2018    Lab Results  Component Value Date   CHOL 173 10/25/2018   HDL 58 10/25/2018   LDLCALC 96 10/25/2018   TRIG 101 10/25/2018   CHOLHDL 3.0 10/25/2018   Lab Results  Component Value Date   LABRPR NON-REACTIVE 10/25/2018   HIV 1 RNA Quant (copies/mL)  Date Value  10/25/2018 <20 NOT DETECTED  10/13/2017 <20 NOT DETECTED  04/29/2017 <20 NOT DETECTED   CD4 T Cell Abs (/uL)  Date Value  10/25/2018 224 (L)  10/13/2017 260 (L)  04/29/2017 240 (L)     Problem List Items Addressed This Visit      High   Human immunodeficiency virus (HIV) disease ([place])   Relevant Orders   CBC   T-helper cell (CD4)- ([facility] clinic only)   Comprehensive metabolic panel   Lipid panel   RPR   HIV-1 RNA quant-no reflex-bld        Michel Bickers, MD [facility] [phone] pager   [phone] cell 11/08/2018, 9:16 AM

## 2018-11-08 NOTE — Assessment & Plan Note (Signed)
He is trying to save up money for his co-pay so he can GI evaluation for his cirrhosis.

## 2018-11-08 NOTE — Assessment & Plan Note (Signed)
His infection remains under excellent, long-term control.  He will continue his current regimen and follow-up after lab work in 1 year.  He received his influenza vaccine today.

## 2018-11-08 NOTE — Assessment & Plan Note (Signed)
He is doing better after recent surgery.

## 2018-11-09 IMAGING — US US ABDOMEN COMPLETE
1 series · 13 of 25 positions shown · non-contrast
Comparison: No recent prior .

CLINICAL DATA: Elevated LFTs.  Thrombocytopenia.

EXAM:
ABDOMEN ULTRASOUND COMPLETE

[Series 1: us abdomen complete · 0.28mm/px · 13 of 77 slices shown]
[im 1/77]
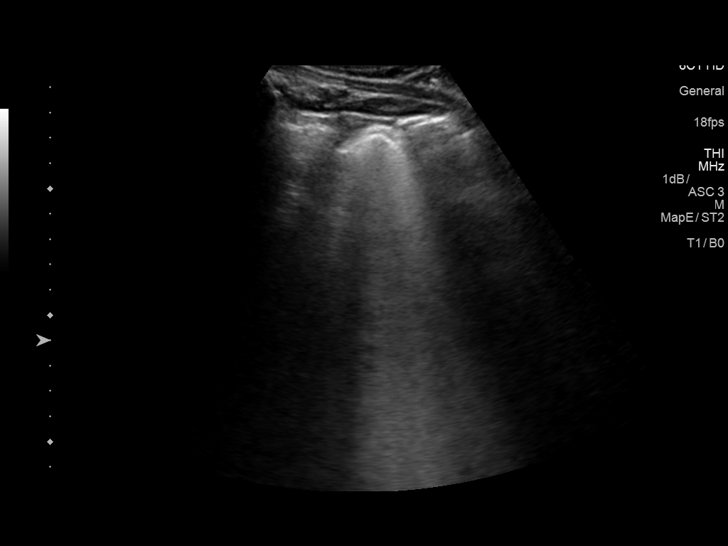
[im 7/77]
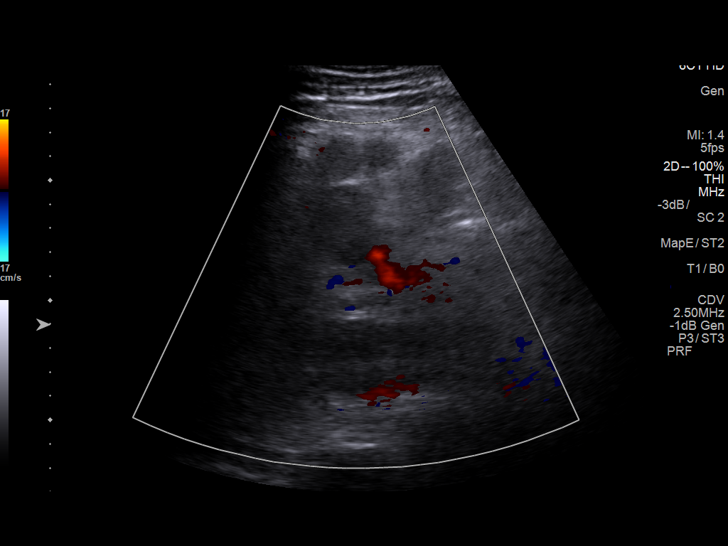
[im 13/77]
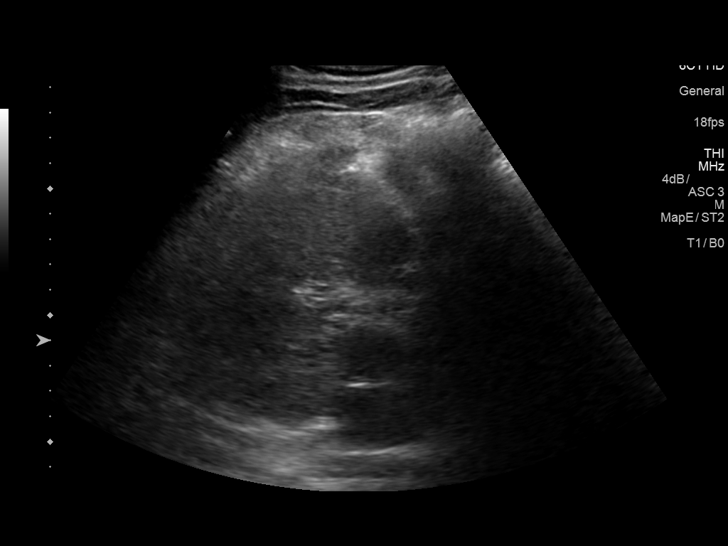
[im 20/77]
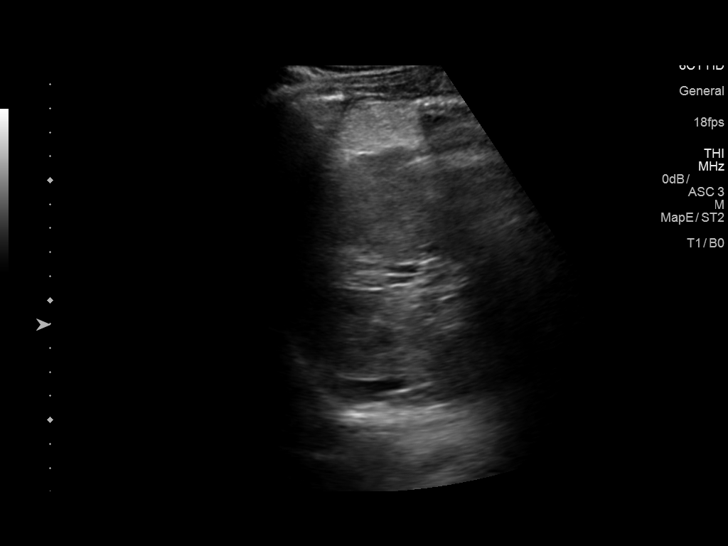
[im 26/77]
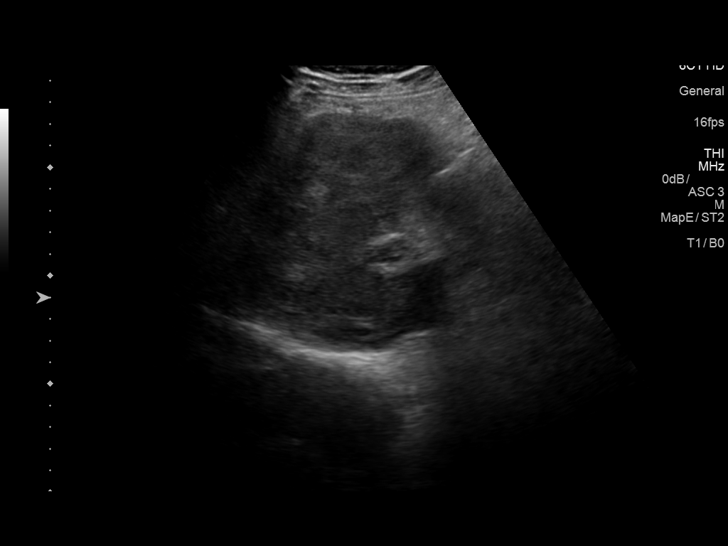
[im 32/77]
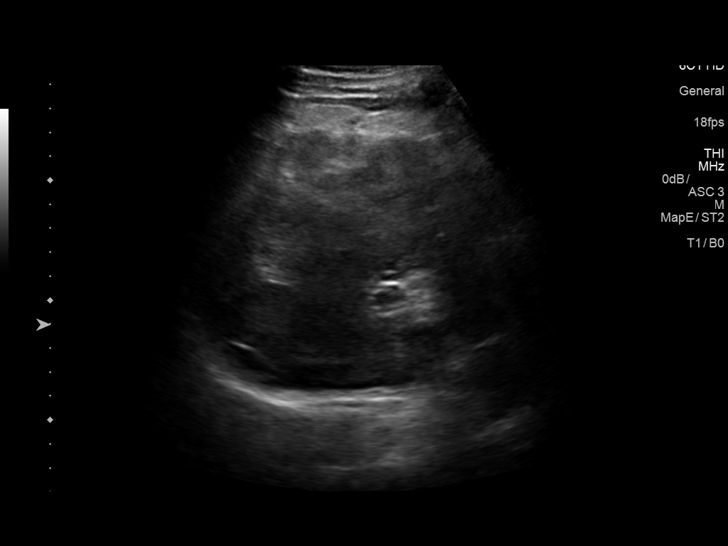
[im 39/77]
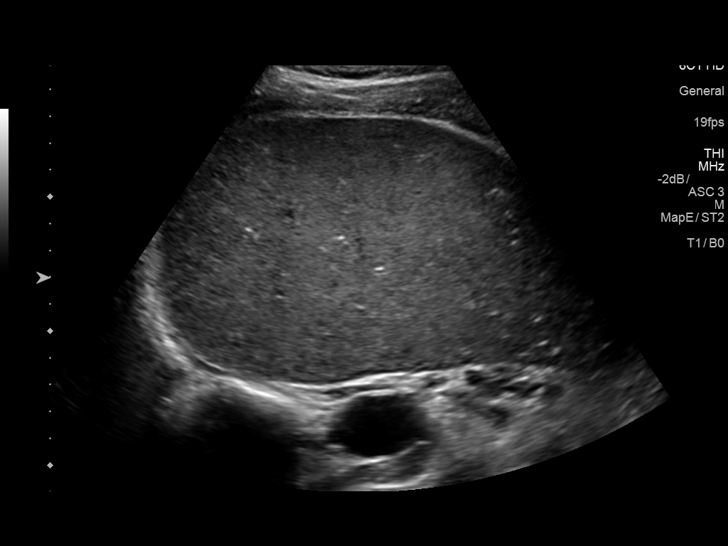
[im 45/77]
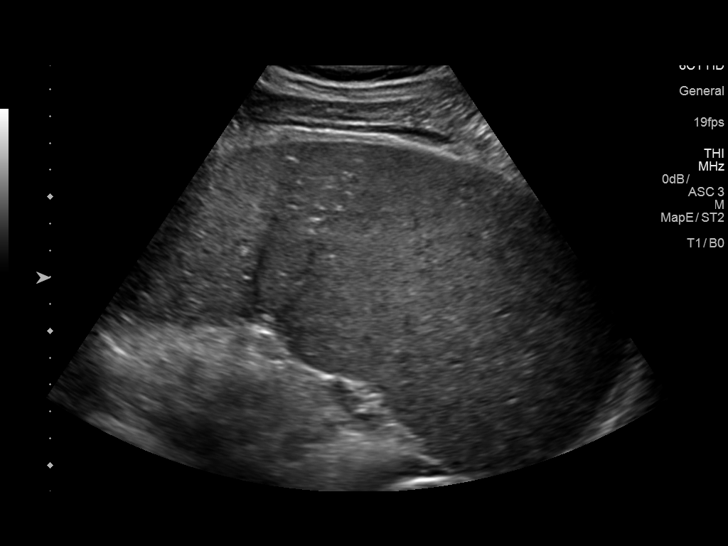
[im 51/77]
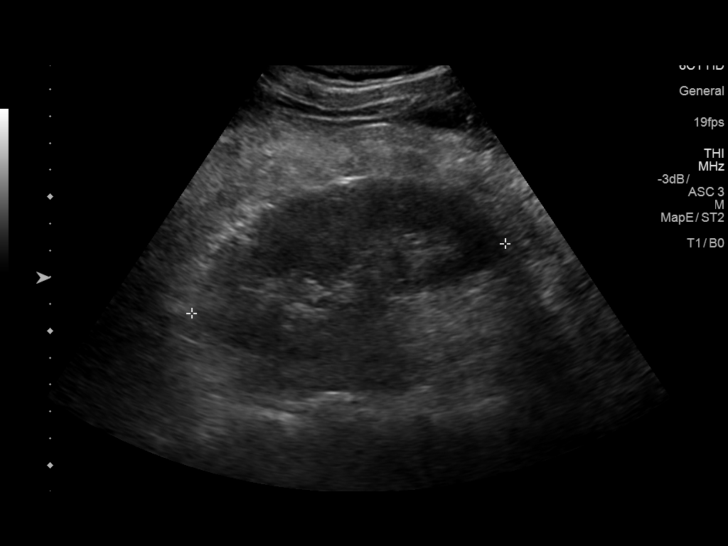
[im 58/77]
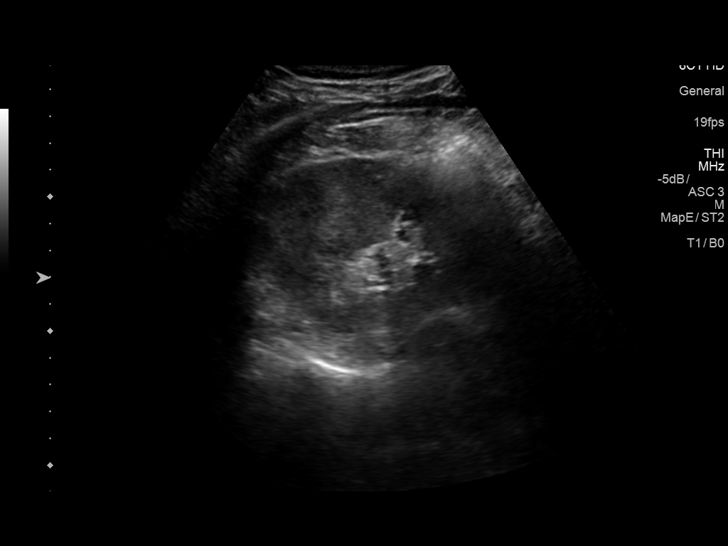
[im 64/77]
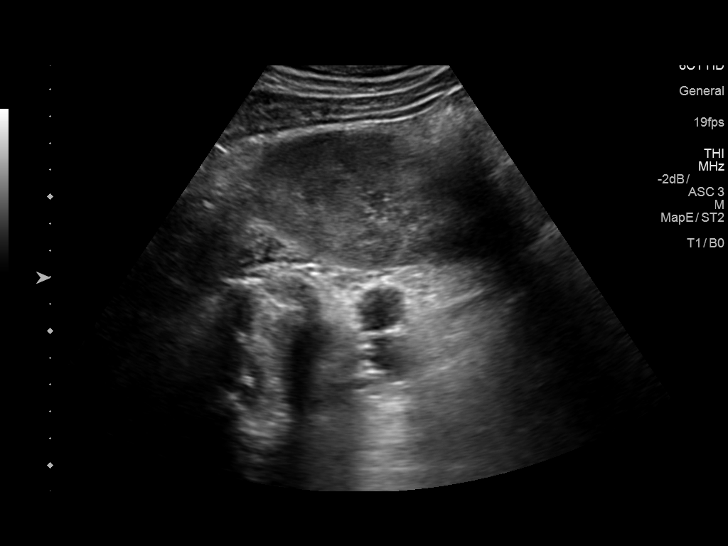
[im 70/77]
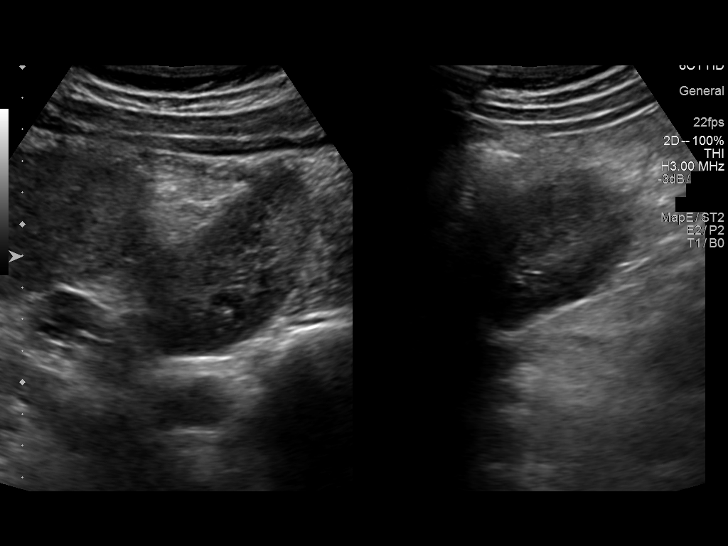
[im 77/77]
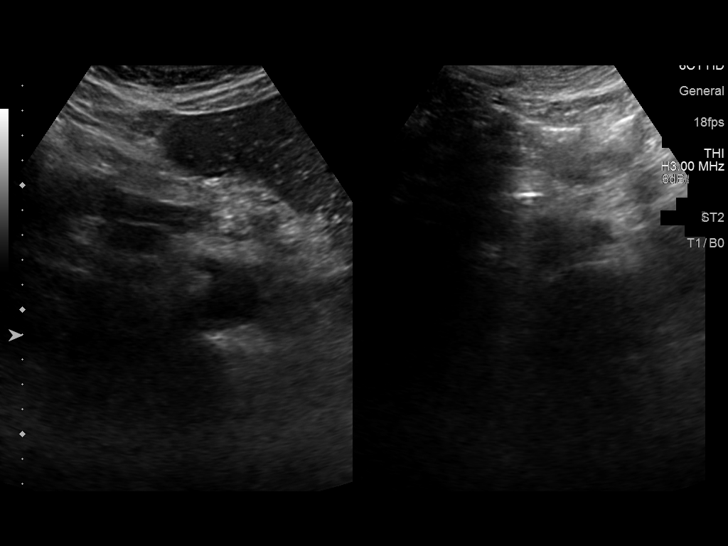

[13 of 25 positions shown; findings below may reference images not displayed]

FINDINGS: Gallbladder: Not visualized due to overlying bowel gas.

Common bile duct: Diameter: Not visualized due to overlying bowel
gas.

Liver: Heterogeneous parenchymal pattern with lobular contour
consistent with cirrhosis. A mass in the central portion of the
liver cannot be completely excluded. MRI of the abdomen can be
obtained for further evaluation. Portal vein not definitely
visualized.

IVC: Limited visualization due to overlying bowel gas.

Pancreas: Limited visualization due to overlying bowel gas.

Spleen: The spleen is enlarged 16.9 cm with a volume of 7177 cc.
Probable 1 cm splenule.

Right Kidney: Length: 12.0 cm. Echogenicity within normal limits. No
mass or hydronephrosis visualized.

Left Kidney: Length: 12.5 cm. Echogenicity within normal limits. No
mass or hydronephrosis visualized.

Abdominal aorta: No aneurysm visualized.

Other findings: Exam very limited due to overlying bowel gas.
IMPRESSION: 1. Very limited exam due to overlying bowel gas. Gallbladder and
biliary system not visualized.

2. Liver has a very heterogeneous parenchymal pattern with lobular
contour consistent with cirrhosis. A mass in the central portion of
the liver cannot be excluded. MRI of the abdomen can be obtained for
further evaluation. Portal vein not definitely visualized.

3. Splenomegaly. The spleen measures 16.9 cm with a volume of 7177
cc. Probable 1 cm splenule.

## 2018-12-01 ENCOUNTER — Other Ambulatory Visit: Payer: Self-pay | Admitting: Internal Medicine

## 2018-12-01 DIAGNOSIS — B20 Human immunodeficiency virus [HIV] disease: Secondary | ICD-10-CM

## 2019-04-14 ENCOUNTER — Encounter: Payer: Self-pay | Admitting: Internal Medicine

## 2019-04-14 ENCOUNTER — Other Ambulatory Visit: Payer: Self-pay

## 2019-04-14 ENCOUNTER — Ambulatory Visit: Payer: Self-pay

## 2019-05-16 ENCOUNTER — Other Ambulatory Visit: Payer: Self-pay | Admitting: Internal Medicine

## 2019-05-16 DIAGNOSIS — B20 Human immunodeficiency virus [HIV] disease: Secondary | ICD-10-CM

## 2019-08-04 ENCOUNTER — Encounter: Payer: Self-pay | Admitting: Internal Medicine

## 2019-08-04 ENCOUNTER — Other Ambulatory Visit: Payer: Self-pay

## 2019-08-04 ENCOUNTER — Ambulatory Visit: Payer: Self-pay

## 2019-10-25 ENCOUNTER — Other Ambulatory Visit: Payer: Self-pay | Admitting: Internal Medicine

## 2019-10-25 DIAGNOSIS — B20 Human immunodeficiency virus [HIV] disease: Secondary | ICD-10-CM

## 2019-11-08 ENCOUNTER — Other Ambulatory Visit: Payer: Self-pay

## 2019-11-10 ENCOUNTER — Other Ambulatory Visit: Payer: Self-pay

## 2019-11-10 ENCOUNTER — Telehealth: Payer: Self-pay

## 2019-11-10 DIAGNOSIS — Z113 Encounter for screening for infections with a predominantly sexual mode of transmission: Secondary | ICD-10-CM

## 2019-11-10 DIAGNOSIS — Z79899 Other long term (current) drug therapy: Secondary | ICD-10-CM

## 2019-11-10 DIAGNOSIS — B20 Human immunodeficiency virus [HIV] disease: Secondary | ICD-10-CM

## 2019-11-10 LAB — T-HELPER CELL (CD4) - (RCID CLINIC ONLY)
CD4 % Helper T Cell: 36 % (ref 33–65)
CD4 T Cell Abs: 210 /uL — ABNORMAL LOW (ref 400–1790)

## 2019-11-10 NOTE — Telephone Encounter (Signed)
Dawn with [employer] called to give critical lab report. Patient's platelet count is 19. platelet count has been repeated and verified. Dr. Juleen China the  provider on call has been paged.

## 2019-11-10 NOTE — Telephone Encounter (Signed)
Per Dr. Juleen China patient's platelet count has been low and call and make sure the patient is feeling ok. I have spoke with the patient and he reports his is feel "great".  Robert Ware

## 2019-11-10 NOTE — Telephone Encounter (Signed)
His low counts are chronic and stable.

## 2019-11-14 LAB — CBC WITH DIFFERENTIAL/PLATELET
Absolute Monocytes: 252 cells/uL (ref 200–950)
Basophils Absolute: 10 cells/uL (ref 0–200)
Basophils Relative: 0.5 %
Eosinophils Absolute: 20 cells/uL (ref 15–500)
Eosinophils Relative: 1 %
HCT: 38.9 % (ref 38.5–50.0)
Hemoglobin: 12 g/dL — ABNORMAL LOW (ref 13.2–17.1)
Lymphs Abs: 646 cells/uL — ABNORMAL LOW (ref 850–3900)
MCH: 22.9 pg — ABNORMAL LOW (ref 27.0–33.0)
MCHC: 30.8 g/dL — ABNORMAL LOW (ref 32.0–36.0)
MCV: 74.1 fL — ABNORMAL LOW (ref 80.0–100.0)
Monocytes Relative: 12.6 %
Neutro Abs: 1072 cells/uL — ABNORMAL LOW (ref 1500–7800)
Neutrophils Relative %: 53.6 %
Platelets: 19 10*3/uL — CL (ref 140–400)
RBC: 5.25 10*6/uL (ref 4.20–5.80)
RDW: 22.3 % — ABNORMAL HIGH (ref 11.0–15.0)
Total Lymphocyte: 32.3 %
WBC: 2 10*3/uL — ABNORMAL LOW (ref 3.8–10.8)

## 2019-11-14 LAB — COMPLETE METABOLIC PANEL WITH GFR
AG Ratio: 1.1 (calc) (ref 1.0–2.5)
ALT: 89 U/L — ABNORMAL HIGH (ref 9–46)
AST: 152 U/L — ABNORMAL HIGH (ref 10–35)
Albumin: 3.5 g/dL — ABNORMAL LOW (ref 3.6–5.1)
Alkaline phosphatase (APISO): 64 U/L (ref 35–144)
BUN: 7 mg/dL (ref 7–25)
CO2: 22 mmol/L (ref 20–32)
Calcium: 8.7 mg/dL (ref 8.6–10.3)
Chloride: 109 mmol/L (ref 98–110)
Creat: 0.9 mg/dL (ref 0.70–1.33)
GFR, Est African American: 111 mL/min/{1.73_m2} (ref >=60)
GFR, Est Non African American: 96 mL/min/{1.73_m2} (ref >=60)
Globulin: 3.2 g/dL (calc) (ref 1.9–3.7)
Glucose, Bld: 89 mg/dL (ref 65–99)
Potassium: 4 mmol/L (ref 3.5–5.3)
Sodium: 140 mmol/L (ref 135–146)
Total Bilirubin: 0.9 mg/dL (ref 0.2–1.2)
Total Protein: 6.7 g/dL (ref 6.1–8.1)

## 2019-11-14 LAB — LIPID PANEL
Cholesterol: 153 mg/dL (ref <200)
HDL: 55 mg/dL (ref >=40)
LDL Cholesterol (Calc): 80 mg/dL (calc)
Non-HDL Cholesterol (Calc): 98 mg/dL (calc) (ref <130)
Total CHOL/HDL Ratio: 2.8 (calc) (ref <5.0)
Triglycerides: 92 mg/dL (ref <150)

## 2019-11-14 LAB — HIV-1 RNA QUANT-NO REFLEX-BLD
HIV 1 RNA Quant: <20 Copies/mL — ABNORMAL HIGH
HIV-1 RNA Quant, Log: <1.3 Log cps/mL — ABNORMAL HIGH

## 2019-11-14 LAB — RPR: RPR Ser Ql: NONREACTIVE

## 2019-11-22 ENCOUNTER — Other Ambulatory Visit: Payer: Self-pay

## 2019-11-22 ENCOUNTER — Ambulatory Visit (INDEPENDENT_AMBULATORY_CARE_PROVIDER_SITE_OTHER): Payer: Self-pay | Admitting: Internal Medicine

## 2019-11-22 ENCOUNTER — Encounter: Payer: Self-pay | Admitting: Internal Medicine

## 2019-11-22 VITALS — BP 138/75 | HR 76 | Wt 233.0 lb

## 2019-11-22 DIAGNOSIS — D696 Thrombocytopenia, unspecified: Secondary | ICD-10-CM

## 2019-11-22 DIAGNOSIS — K7469 Other cirrhosis of liver: Secondary | ICD-10-CM

## 2019-11-22 DIAGNOSIS — B20 Human immunodeficiency virus [HIV] disease: Secondary | ICD-10-CM

## 2019-11-22 DIAGNOSIS — Z23 Encounter for immunization: Secondary | ICD-10-CM

## 2019-11-22 DIAGNOSIS — R21 Rash and other nonspecific skin eruption: Secondary | ICD-10-CM | POA: Insufficient documentation

## 2019-11-22 DIAGNOSIS — R161 Splenomegaly, not elsewhere classified: Secondary | ICD-10-CM

## 2019-11-22 MED ORDER — DARUNAVIR ETHANOLATE 800 MG PO TABS: ORAL_TABLET | ORAL | 11 refills | Status: DC

## 2019-11-22 MED ORDER — CLOTRIMAZOLE 1 % EX CREA: 1.0000 "application " | TOPICAL_CREAM | Freq: Two times a day (BID) | CUTANEOUS | 3 refills | Status: DC

## 2019-11-22 MED ORDER — GENVOYA 150-150-200-10 MG PO TABS: 1.0000 | ORAL_TABLET | Freq: Every day | ORAL | 11 refills | Status: DC

## 2019-11-22 NOTE — Progress Notes (Signed)
Patient Active Problem List   Diagnosis Date Noted  . Human immunodeficiency virus (HIV) disease ([place]) 02/06/2006    Priority: High  . Cirrhosis ([place]) 02/25/2017    Priority: Medium  . Splenomegaly 02/25/2017    Priority: Medium  . Elevated liver enzymes 06/14/2014    Priority: Medium  . Thrombocytopenia ([place]) 02/06/2006    Priority: Medium  . Rash 11/22/2019  . Headache 12/07/2012  . SEBORRHEA 04/17/2008  . Genital warts 02/06/2006  . ERECTILE DYSFUNCTION 02/06/2006  . GERD 02/06/2006    Patient's Medications  New Prescriptions   CLOTRIMAZOLE (LOTRIMIN) 1 % CREAM    Apply 1 application topically 2 (two) times daily.  Previous Medications   ACETAMINOPHEN (TYLENOL) 500 MG TABLET    Take 1 tablet (500 mg total) by mouth every 6 (six) hours as needed.   CYANOCOBALAMIN (VITAMIN B-12 PO)    Take 1 tablet by mouth daily.  Modified Medications   Modified Medication Previous Medication   DARUNAVIR (PREZISTA) 800 MG TABLET PREZISTA 800 MG tablet      TAKE 1 TABLET(800 MG) BY MOUTH DAILY WITH BREAKFAST    TAKE 1 TABLET(800 MG) BY MOUTH DAILY WITH BREAKFAST   ELVITEGRAVIR-COBICISTAT-EMTRICITABINE-TENOFOVIR (GENVOYA) 150-150-200-10 MG TABS TABLET GENVOYA 150-150-200-10 MG TABS tablet      Take 1 tablet by mouth daily with breakfast.    TAKE 1 TABLET BY MOUTH DAILY WITH BREAKFAST  Discontinued Medications   No medications on file    Subjective: Robert Ware is in for his routine HIV follow-up visit he says that he has missed 2 doses of his Genvoya and Prezista in the last year.  That occurred recently when his pharmacy said he needed new refills.  He is not on any new medications.  After his visit last year he did connect with someone in a local gastroenterology office.  He cannot remember the doctor's name.  He had gotten insurance off of the exchange but was told that that office did not accept his insurance.  He received his Moderna Covid booster yesterday.  Review of  Systems: Review of Systems  Constitutional: Negative for chills, diaphoresis, fever, malaise/fatigue and weight loss.  HENT: Negative for sore throat.   Respiratory: Negative for cough, sputum production and shortness of breath.   Cardiovascular: Negative for chest pain.  Gastrointestinal: Negative for abdominal pain, diarrhea, heartburn, nausea and vomiting.  Genitourinary: Negative for dysuria and frequency.  Musculoskeletal: Negative for joint pain and myalgias.  Skin: Positive for itching and rash.  Neurological: Negative for dizziness and headaches.  Endo/Heme/Allergies: Does not bruise/bleed easily.  Psychiatric/Behavioral: Negative for depression and substance abuse. The patient is not nervous/anxious.     Past Medical History:  Diagnosis Date  . Cirrhosis ([place])    PT DENIES  . Condyloma   . ED (erectile dysfunction)   . Elevated liver enzymes   . GERD (gastroesophageal reflux disease)   . HIV (human immunodeficiency virus infection) ([place])   . Seborrhea   . Splenomegaly   . Thrombocytopenia ([place])   . Warts     Social History   Tobacco Use  . Smoking status: Never Smoker  . Smokeless tobacco: Never Used  Vaping Use  . Vaping Use: Never used  Substance Use Topics  . Alcohol use: Yes    Alcohol/week: 0.0 standard drinks    Comment: occassionaly  . Drug use: No    Family History  Problem Relation Age of Onset  . Stroke Father   .  Diabetes Mother   . Hypertension Mother     No Known Allergies  Health Maintenance  Topic Date Due  . COVID-19 Vaccine (1) Never done  . TETANUS/TDAP  Never done  . COLONOSCOPY  Never done  . INFLUENZA VACCINE  08/27/2019  . Hepatitis C Screening  Completed  . HIV Screening  Completed    Objective:  Vitals:   11/22/19 0847  BP: 138/75  Pulse: 76  SpO2: 99%  Weight: 233 lb (105.7 kg)   Body mass index is 29.92 kg/m.  Physical Exam Constitutional:      Comments: He is in good spirits as usual.  Cardiovascular:      Rate and Rhythm: Normal rate.     Heart sounds: No murmur heard.   Pulmonary:     Effort: Pulmonary effort is normal.     Breath sounds: Normal breath sounds.  Abdominal:     Palpations: Abdomen is soft. There is no mass.     Tenderness: There is no abdominal tenderness.  Musculoskeletal:        General: No swelling or tenderness.  Skin:    General: Skin is dry.     Findings: Rash present.     Comments: He has a dry, hyperpigmented patch of skin on his left lower abdomen.  Neurological:     General: No focal deficit present.  Psychiatric:        Mood and Affect: Mood normal.     Lab Results Lab Results  Component Value Date   WBC 2.0 (L) 11/10/2019   HGB 12.0 (L) 11/10/2019   HCT 38.9 11/10/2019   MCV 74.1 (L) 11/10/2019   PLT 19 (LL) 11/10/2019    Lab Results  Component Value Date   CREATININE 0.90 11/10/2019   BUN 7 11/10/2019   NA 140 11/10/2019   K 4.0 11/10/2019   CL 109 11/10/2019   CO2 22 11/10/2019    Lab Results  Component Value Date   ALT 89 (H) 11/10/2019   AST 152 (H) 11/10/2019   ALKPHOS 54 07/28/2018   BILITOT 0.9 11/10/2019    Lab Results  Component Value Date   CHOL 153 11/10/2019   HDL 55 11/10/2019   LDLCALC 80 11/10/2019   TRIG 92 11/10/2019   CHOLHDL 2.8 11/10/2019   Lab Results  Component Value Date   LABRPR NON-REACTIVE 11/10/2019   HIV 1 RNA Quant  Date Value  11/10/2019 <20 Copies/mL (H)  10/25/2018 <20 NOT DETECTED copies/mL  10/13/2017 <20 NOT DETECTED copies/mL   CD4 T Cell Abs (/uL)  Date Value  11/10/2019 210 (L)  10/25/2018 224 (L)  10/13/2017 260 (L)     Problem List Items Addressed This Visit      High   Human immunodeficiency virus (HIV) disease ([place])    His infection remains under excellent, long-term control.  He will continue Genvoya and Prezista and follow-up after lab work in 1 year.      Relevant Medications   elvitegravir-cobicistat-emtricitabine-tenofovir (GENVOYA) 150-150-200-10 MG TABS tablet    darunavir (PREZISTA) 800 MG tablet   clotrimazole (LOTRIMIN) 1 % cream   Other Relevant Orders   CBC   T-helper cell (CD4)- ([facility] clinic only)   Comprehensive metabolic panel   Lipid panel   RPR   HIV-1 RNA quant-no reflex-bld     Medium   Cirrhosis ([place])    He has cirrhosis, splenomegaly and chronic thrombocytopenia of unknown cause.  I will make another referral for gastroenterology evaluation.  Relevant Orders   Ambulatory referral to Gastroenterology     Unprioritized   Rash    I am not sure what is causing his rash.  I will treat him with empiric clotrimazole cream.      Relevant Medications   clotrimazole (LOTRIMIN) 1 % cream        Michel Bickers, MD [facility] for [place] [phone] pager   [phone] cell 11/22/2019, 9:05 AM

## 2019-11-22 NOTE — Assessment & Plan Note (Signed)
I am not sure what is causing his rash.  I will treat him with empiric clotrimazole cream.

## 2019-11-22 NOTE — Assessment & Plan Note (Signed)
His infection remains under excellent, long-term control.  He will continue Genvoya and Prezista and follow-up after lab work in 1 year.

## 2019-11-22 NOTE — Assessment & Plan Note (Signed)
He has cirrhosis, splenomegaly and chronic thrombocytopenia of unknown cause.  I will make another referral for gastroenterology evaluation.

## 2020-02-21 ENCOUNTER — Encounter: Payer: Self-pay | Admitting: Gastroenterology

## 2020-02-21 ENCOUNTER — Other Ambulatory Visit: Payer: Self-pay

## 2020-02-21 ENCOUNTER — Ambulatory Visit (INDEPENDENT_AMBULATORY_CARE_PROVIDER_SITE_OTHER): Payer: 59 | Admitting: Gastroenterology

## 2020-02-21 ENCOUNTER — Other Ambulatory Visit (INDEPENDENT_AMBULATORY_CARE_PROVIDER_SITE_OTHER): Payer: 59

## 2020-02-21 DIAGNOSIS — Z1211 Encounter for screening for malignant neoplasm of colon: Secondary | ICD-10-CM

## 2020-02-21 DIAGNOSIS — K7460 Unspecified cirrhosis of liver: Secondary | ICD-10-CM | POA: Diagnosis not present

## 2020-02-21 LAB — COMPREHENSIVE METABOLIC PANEL
ALT: 70 U/L — ABNORMAL HIGH (ref 0–53)
AST: 94 U/L — ABNORMAL HIGH (ref 0–37)
Albumin: 3.7 g/dL (ref 3.5–5.2)
Alkaline Phosphatase: 69 U/L (ref 39–117)
BUN: 11 mg/dL (ref 6–23)
CO2: 24 mEq/L (ref 19–32)
Calcium: 8.8 mg/dL (ref 8.4–10.5)
Chloride: 108 mEq/L (ref 96–112)
Creatinine, Ser: 0.79 mg/dL (ref 0.40–1.50)
GFR: 99.89 mL/min (ref >60.00)
Glucose, Bld: 86 mg/dL (ref 70–99)
Potassium: 4.1 mEq/L (ref 3.5–5.1)
Sodium: 138 mEq/L (ref 135–145)
Total Bilirubin: 1.1 mg/dL (ref 0.2–1.2)
Total Protein: 7.4 g/dL (ref 6.0–8.3)

## 2020-02-21 LAB — PROTIME-INR
INR: 1.3 ratio — ABNORMAL HIGH (ref 0.8–1.0)
Prothrombin Time: 14.4 s — ABNORMAL HIGH (ref 9.6–13.1)

## 2020-02-21 LAB — CBC
HCT: 39.1 % (ref 39.0–52.0)
Hemoglobin: 13.1 g/dL (ref 13.0–17.0)
MCHC: 33.4 g/dL (ref 30.0–36.0)
MCV: 71.7 fl — ABNORMAL LOW (ref 78.0–100.0)
Platelets: 24 10*3/uL — CL (ref 150.0–400.0)
RBC: 5.46 Mil/uL (ref 4.22–5.81)
RDW: 22.1 % — ABNORMAL HIGH (ref 11.5–15.5)
WBC: 2.1 10*3/uL — ABNORMAL LOW (ref 4.0–10.5)

## 2020-02-21 LAB — IBC + FERRITIN
Ferritin: 13.6 ng/mL — ABNORMAL LOW (ref 22.0–322.0)
Iron: 55 ug/dL (ref 42–165)
Saturation Ratios: 11.8 % — ABNORMAL LOW (ref 20.0–50.0)
Transferrin: 334 mg/dL (ref 212.0–360.0)

## 2020-02-21 NOTE — Patient Instructions (Addendum)
If you are age 56 or younger, your body mass index should be between 19-25. Your Body mass index is 29.68 kg/m. If this is out of the aformentioned range listed, please consider follow up with your Primary Care Provider.   Your provider has requested that you go to the basement level for lab work before leaving today. Press "B" on the elevator. The lab is located at the first door on the left as you exit the elevator.  You have been scheduled for an endoscopy and colonoscopy. Please follow the written instructions given to you at your visit today. Please pick up your prep supplies at the pharmacy within the next 1-3 days. If you use inhalers (even only as needed), please bring them with you on the day of your procedure.  Due to recent changes in healthcare laws, you may see the results of your imaging and laboratory studies on MyChart before your provider has had a chance to review them.  We understand that in some cases there may be results that are confusing or concerning to you. Not all laboratory results come back in the same time frame and the provider may be waiting for multiple results in order to interpret others.  Please give [place] 48 hours in order for your provider to thoroughly review all the results before contacting the office for clarification of your results.   You have been scheduled for an abdominal ultrasound ATTN: Liver at [facility] (1st floor of hospital) on 02-26-2020 at 9am. Please arrive 15 minutes prior to your appointment for registration. Make certain not to have anything to eat or drink 8 hours prior to your appointment. Should you need to reschedule your appointment, please contact radiology at [phone]. This test typically takes about 30 minutes to perform.  Please follow a low salt diet.  Thank you for entrusting me with your care and choosing [facility].  Dr Ardis Hughs

## 2020-02-21 NOTE — Addendum Note (Signed)
Addended by: Trenda Moots on: 5/92/9244 11:25 AM   Modules accepted: Orders

## 2020-02-21 NOTE — Addendum Note (Signed)
Addended by: Stevan Born on: 02/21/2020 11:57 AM   Modules accepted: Orders

## 2020-02-21 NOTE — Progress Notes (Signed)
HPI: This is a very pleasant 55 year old man who was referred to me by Robert Bickers, MD  to evaluate cirrhosis.    He was told he had cirrhosis several years ago.  He is not sure why he has this.  He has never been a big alcohol abuser.  Used to drink wine and beer occasionally.  He stopped completely about a year ago.  Liver disease does not run in his family.  He has never had hepatitis viruses that he is aware of.  He has not had any trouble with edema in his abdomen or in his lower extremities.  No overt GI bleeding.  No significant weight gains or losses.  No significant abdominal pains.  No encephalopathy like events.   Old Data Reviewed: Abdominal ultrasound 12/2016 indication elevated liver tests, low platelets.  Findings lobular liver consistent with cirrhosis "a mass in the central portion of the liver cannot be completely excluded"  Blood work 2021: AST 152, ALT 89, total bilirubin normal, hemoglobin 12.0, MCV 74, platelets 19,000.  Of note he has had a significant thrombocytopenia for at least 12 years now (platelets 27,000 in 2010).  He has HIV under apparently excellent long-term control per his recent infectious disease physician note October 2021  Blood work 2016 hepatitis B surface antibody negative, hepatitis C antibody negative, hepatitis B surface antigen negative  Review of systems: Pertinent positive and negative review of systems were noted in the above HPI section. All other review negative.   Past Medical History:  Diagnosis Date  . Cirrhosis ([place])    PT DENIES  . Condyloma   . ED (erectile dysfunction)   . Elevated liver enzymes   . GERD (gastroesophageal reflux disease)   . HIV (human immunodeficiency virus infection) ([place])   . Seborrhea   . Splenomegaly   . Thrombocytopenia ([place])   . Warts     Past Surgical History:  Procedure Laterality Date  . CONDYLOMA EXCISION/FULGURATION  2007  . CONDYLOMA EXCISION/FULGURATION N/A 07/01/2018   Procedure:  CONDYLOMA REMOVAL;  Surgeon: Franchot Gallo, MD;  Location: [facility];  Service: Urology;  Laterality: N/A;    Current Outpatient Medications  Medication Sig Dispense Refill  . acetaminophen (TYLENOL) 500 MG tablet Take 1 tablet (500 mg total) by mouth every 6 (six) hours as needed. 30 tablet 0  . clotrimazole (LOTRIMIN) 1 % cream Apply 1 application topically 2 (two) times daily. 30 g 3  . Cyanocobalamin (VITAMIN B-12 PO) Take 1 tablet by mouth daily.    . darunavir (PREZISTA) 800 MG tablet TAKE 1 TABLET(800 MG) BY MOUTH DAILY WITH BREAKFAST 30 tablet 11  . elvitegravir-cobicistat-emtricitabine-tenofovir (GENVOYA) 150-150-200-10 MG TABS tablet Take 1 tablet by mouth daily with breakfast. 30 tablet 11   No current facility-administered medications for this visit.    Allergies as of 02/21/2020  . (No Known Allergies)    Family History  Problem Relation Age of Onset  . Stroke Father   . Diabetes Mother   . Hypertension Mother   . Colon polyps Neg Hx   . Colon cancer Neg Hx     Social History   Socioeconomic History  . Marital status: Married    Spouse name: Not on file  . Number of children: Not on file  . Years of education: Not on file  . Highest education level: Not on file  Occupational History  . Not on file  Tobacco Use  . Smoking status: Never Smoker  . Smokeless tobacco:  Never Used  Vaping Use  . Vaping Use: Never used  Substance and Sexual Activity  . Alcohol use: Not Currently    Alcohol/week: 0.0 standard drinks    Comment: occassionaly  . Drug use: No  . Sexual activity: Not Currently    Comment: offered condoms  Other Topics Concern  . Not on file  Social History Narrative  . Not on file   Social Determinants of Health   Financial Resource Strain: Not on file  Food Insecurity: Not on file  Transportation Needs: Not on file  Physical Activity: Not on file  Stress: Not on file  Social Connections: Not on file  Intimate  Partner Violence: Not on file     Physical Exam: Wt 231 lb 3.2 oz (104.9 kg)   BMI 29.68 kg/m  Constitutional: generally well-appearing Psychiatric: alert and oriented x3 Eyes: extraocular movements intact Mouth: oral pharynx moist, no lesions Neck: supple no lymphadenopathy Cardiovascular: heart regular rate and rhythm Lungs: clear to auscultation bilaterally Abdomen: soft, nontender, nondistended, no obvious ascites, no peritoneal signs, normal bowel sounds Extremities: no lower extremity edema bilaterally Skin: no lesions on visible extremities   Assessment and plan: 55 y.o. male with cirrhosis, well-controlled HIV, routine risk for colon cancer  First I explained that we need to do quite a bit of testing for his cirrhosis to understand exactly why he has it, how severe it is, how well his liver is functioning and whether or not he has esophageal varices.  See the battery of blood test in his AVS below.  He will also get an abdominal ultrasound to screen for hepatoma.  We will arrange for EGD at his soonest convenience to screen for esophageal varices.  At the same time colonoscopy will be prudent since he has never had colon cancer screening.  Given his very low platelets I think it is probably safest to do the colonoscopy and upper endoscopy in the hospital setting, he might require platelet transfusion for other interventions to increase his platelets before the endoscopies.  Please see the "Patient Instructions" section for addition details about the plan.   Robert Korn, MD [facility] 02/21/2020, 10:15 AM  Cc: [name], [name], MD  Total time on date of encounter was 45  minutes (this included time spent preparing to see the patient reviewing records; obtaining and/or reviewing separately obtained history; performing a medically appropriate exam and/or evaluation; counseling and educating the patient and family if present; ordering medications, tests or  procedures if applicable; and documenting clinical information in the health record).   

## 2020-02-21 NOTE — H&P (View-Only) (Signed)
HPI: This is a very pleasant 55 year old man who was referred to me by Michel Bickers, MD  to evaluate cirrhosis.    He was told he had cirrhosis several years ago.  He is not sure why he has this.  He has never been a big alcohol abuser.  Used to drink wine and beer occasionally.  He stopped completely about a year ago.  Liver disease does not run in his family.  He has never had hepatitis viruses that he is aware of.  He has not had any trouble with edema in his abdomen or in his lower extremities.  No overt GI bleeding.  No significant weight gains or losses.  No significant abdominal pains.  No encephalopathy like events.   Old Data Reviewed: Abdominal ultrasound 12/2016 indication elevated liver tests, low platelets.  Findings lobular liver consistent with cirrhosis "a mass in the central portion of the liver cannot be completely excluded"  Blood work 2021: AST 152, ALT 89, total bilirubin normal, hemoglobin 12.0, MCV 74, platelets 19,000.  Of note he has had a significant thrombocytopenia for at least 12 years now (platelets 27,000 in 2010).  He has HIV under apparently excellent long-term control per his recent infectious disease physician note October 2021  Blood work 2016 hepatitis B surface antibody negative, hepatitis C antibody negative, hepatitis B surface antigen negative  Review of systems: Pertinent positive and negative review of systems were noted in the above HPI section. All other review negative.   Past Medical History:  Diagnosis Date  . Cirrhosis ([place])    PT DENIES  . Condyloma   . ED (erectile dysfunction)   . Elevated liver enzymes   . GERD (gastroesophageal reflux disease)   . HIV (human immunodeficiency virus infection) ([place])   . Seborrhea   . Splenomegaly   . Thrombocytopenia ([place])   . Warts     Past Surgical History:  Procedure Laterality Date  . CONDYLOMA EXCISION/FULGURATION  2007  . CONDYLOMA EXCISION/FULGURATION N/A 07/01/2018   Procedure:  CONDYLOMA REMOVAL;  Surgeon: Franchot Gallo, MD;  Location: [facility];  Service: Urology;  Laterality: N/A;    Current Outpatient Medications  Medication Sig Dispense Refill  . acetaminophen (TYLENOL) 500 MG tablet Take 1 tablet (500 mg total) by mouth every 6 (six) hours as needed. 30 tablet 0  . clotrimazole (LOTRIMIN) 1 % cream Apply 1 application topically 2 (two) times daily. 30 g 3  . Cyanocobalamin (VITAMIN B-12 PO) Take 1 tablet by mouth daily.    . darunavir (PREZISTA) 800 MG tablet TAKE 1 TABLET(800 MG) BY MOUTH DAILY WITH BREAKFAST 30 tablet 11  . elvitegravir-cobicistat-emtricitabine-tenofovir (GENVOYA) 150-150-200-10 MG TABS tablet Take 1 tablet by mouth daily with breakfast. 30 tablet 11   No current facility-administered medications for this visit.    Allergies as of 02/21/2020  . (No Known Allergies)    Family History  Problem Relation Age of Onset  . Stroke Father   . Diabetes Mother   . Hypertension Mother   . Colon polyps Neg Hx   . Colon cancer Neg Hx     Social History   Socioeconomic History  . Marital status: Married    Spouse name: Not on file  . Number of children: Not on file  . Years of education: Not on file  . Highest education level: Not on file  Occupational History  . Not on file  Tobacco Use  . Smoking status: Never Smoker  . Smokeless tobacco:  Never Used  Vaping Use  . Vaping Use: Never used  Substance and Sexual Activity  . Alcohol use: Not Currently    Alcohol/week: 0.0 standard drinks    Comment: occassionaly  . Drug use: No  . Sexual activity: Not Currently    Comment: offered condoms  Other Topics Concern  . Not on file  Social History Narrative  . Not on file   Social Determinants of Health   Financial Resource Strain: Not on file  Food Insecurity: Not on file  Transportation Needs: Not on file  Physical Activity: Not on file  Stress: Not on file  Social Connections: Not on file  Intimate  Partner Violence: Not on file     Physical Exam: Wt 231 lb 3.2 oz (104.9 kg)   BMI 29.68 kg/m  Constitutional: generally well-appearing Psychiatric: alert and oriented x3 Eyes: extraocular movements intact Mouth: oral pharynx moist, no lesions Neck: supple no lymphadenopathy Cardiovascular: heart regular rate and rhythm Lungs: clear to auscultation bilaterally Abdomen: soft, nontender, nondistended, no obvious ascites, no peritoneal signs, normal bowel sounds Extremities: no lower extremity edema bilaterally Skin: no lesions on visible extremities   Assessment and plan: 55 y.o. male with cirrhosis, well-controlled HIV, routine risk for colon cancer  First I explained that we need to do quite a bit of testing for his cirrhosis to understand exactly why he has it, how severe it is, how well his liver is functioning and whether or not he has esophageal varices.  See the battery of blood test in his AVS below.  He will also get an abdominal ultrasound to screen for hepatoma.  We will arrange for EGD at his soonest convenience to screen for esophageal varices.  At the same time colonoscopy will be prudent since he has never had colon cancer screening.  Given his very low platelets I think it is probably safest to do the colonoscopy and upper endoscopy in the hospital setting, he might require platelet transfusion for other interventions to increase his platelets before the endoscopies.  Please see the "Patient Instructions" section for addition details about the plan.   Owens Loffler, MD [facility] 02/21/2020, 10:15 AM  Cc: Michel Bickers, MD  Total time on date of encounter was 45  minutes (this included time spent preparing to see the patient reviewing records; obtaining and/or reviewing separately obtained history; performing a medically appropriate exam and/or evaluation; counseling and educating the patient and family if present; ordering medications, tests or  procedures if applicable; and documenting clinical information in the health record).

## 2020-02-25 LAB — HEPATITIS B SURFACE ANTIBODY,QUALITATIVE: Hep B S Ab: NONREACTIVE

## 2020-02-25 LAB — ANTI-SMOOTH MUSCLE ANTIBODY, IGG: Actin (Smooth Muscle) Antibody (IGG): <20 U

## 2020-02-25 LAB — ANA: Anti Nuclear Antibody (ANA): NEGATIVE

## 2020-02-25 LAB — HEPATITIS C ANTIBODY
Hepatitis C Ab: NONREACTIVE
SIGNAL TO CUT-OFF: 0.09 (ref <1.00)

## 2020-02-25 LAB — MITOCHONDRIAL ANTIBODIES: Mitochondrial M2 Ab, IgG: <=20 U

## 2020-02-25 LAB — HEPATITIS A ANTIBODY, TOTAL: Hepatitis A AB,Total: REACTIVE — AB

## 2020-02-25 LAB — HEPATITIS B SURFACE ANTIGEN: Hepatitis B Surface Ag: NONREACTIVE

## 2020-02-25 LAB — AFP TUMOR MARKER: AFP-Tumor Marker: 3.9 ng/mL (ref <6.1)

## 2020-02-25 LAB — IGA: Immunoglobulin A: 360 mg/dL — ABNORMAL HIGH (ref 47–310)

## 2020-02-25 LAB — CERULOPLASMIN: Ceruloplasmin: 28 mg/dL (ref 18–36)

## 2020-02-25 LAB — ALPHA-1-ANTITRYPSIN: A-1 Antitrypsin, Ser: 162 mg/dL (ref 83–199)

## 2020-02-25 LAB — TISSUE TRANSGLUTAMINASE, IGA: (tTG) Ab, IgA: 1.4 U/mL

## 2020-02-26 ENCOUNTER — Other Ambulatory Visit: Payer: Self-pay

## 2020-02-26 ENCOUNTER — Ambulatory Visit (HOSPITAL_COMMUNITY)
Admission: RE | Admit: 2020-02-26 | Discharge: 2020-02-26 | Disposition: A | Discharge status: Home / Self Care | Payer: 59 | Source: Ambulatory Visit | Attending: Gastroenterology | Admitting: Gastroenterology

## 2020-02-26 DIAGNOSIS — K7460 Unspecified cirrhosis of liver: Secondary | ICD-10-CM | POA: Diagnosis present

## 2020-02-26 DIAGNOSIS — Z1211 Encounter for screening for malignant neoplasm of colon: Secondary | ICD-10-CM | POA: Diagnosis present

## 2020-02-27 ENCOUNTER — Other Ambulatory Visit: Payer: Self-pay

## 2020-02-27 DIAGNOSIS — K7460 Unspecified cirrhosis of liver: Secondary | ICD-10-CM

## 2020-02-27 MED ORDER — SODIUM CHLORIDE 0.9% IV SOLUTION: Freq: Once | INTRAVENOUS | Status: DC

## 2020-03-13 NOTE — Progress Notes (Signed)
Attempted to obtain medical history via telephone, unable to reach at this time. I left a voicemail to return pre surgical testing department's phone call.  

## 2020-03-15 ENCOUNTER — Other Ambulatory Visit: Payer: Self-pay

## 2020-03-18 ENCOUNTER — Other Ambulatory Visit (HOSPITAL_COMMUNITY)
Admission: RE | Admit: 2020-03-18 | Discharge: 2020-03-18 | Disposition: A | Discharge status: Home / Self Care | Payer: 59 | Source: Ambulatory Visit | Attending: Gastroenterology | Admitting: Gastroenterology

## 2020-03-18 DIAGNOSIS — Z20822 Contact with and (suspected) exposure to covid-19: Secondary | ICD-10-CM | POA: Insufficient documentation

## 2020-03-18 DIAGNOSIS — Z01812 Encounter for preprocedural laboratory examination: Secondary | ICD-10-CM | POA: Diagnosis present

## 2020-03-19 ENCOUNTER — Other Ambulatory Visit: Payer: Self-pay

## 2020-03-19 DIAGNOSIS — D696 Thrombocytopenia, unspecified: Secondary | ICD-10-CM

## 2020-03-19 LAB — SARS CORONAVIRUS 2 (TAT 6-24 HRS): SARS Coronavirus 2: NEGATIVE

## 2020-03-21 ENCOUNTER — Telehealth: Payer: Self-pay

## 2020-03-21 ENCOUNTER — Ambulatory Visit (HOSPITAL_COMMUNITY): Payer: 59 | Admitting: Anesthesiology

## 2020-03-21 ENCOUNTER — Ambulatory Visit (HOSPITAL_COMMUNITY)
Admission: RE | Admit: 2020-03-21 | Discharge: 2020-03-21 | Disposition: A | Discharge status: Home / Self Care | Payer: 59 | Attending: Gastroenterology | Admitting: Gastroenterology

## 2020-03-21 ENCOUNTER — Encounter (HOSPITAL_COMMUNITY)
Admission: RE | Disposition: A | Discharge status: Home / Self Care | Payer: Self-pay | Source: Home / Self Care | Attending: Gastroenterology

## 2020-03-21 ENCOUNTER — Other Ambulatory Visit: Payer: Self-pay

## 2020-03-21 DIAGNOSIS — B9681 Helicobacter pylori [H. pylori] as the cause of diseases classified elsewhere: Secondary | ICD-10-CM | POA: Diagnosis not present

## 2020-03-21 DIAGNOSIS — K7460 Unspecified cirrhosis of liver: Secondary | ICD-10-CM | POA: Diagnosis not present

## 2020-03-21 DIAGNOSIS — Z13810 Encounter for screening for upper gastrointestinal disorder: Secondary | ICD-10-CM | POA: Diagnosis present

## 2020-03-21 DIAGNOSIS — K3189 Other diseases of stomach and duodenum: Secondary | ICD-10-CM | POA: Diagnosis not present

## 2020-03-21 DIAGNOSIS — Z21 Asymptomatic human immunodeficiency virus [HIV] infection status: Secondary | ICD-10-CM | POA: Diagnosis not present

## 2020-03-21 DIAGNOSIS — K31A19 Gastric intestinal metaplasia without dysplasia, unspecified site: Secondary | ICD-10-CM | POA: Diagnosis not present

## 2020-03-21 DIAGNOSIS — I8510 Secondary esophageal varices without bleeding: Secondary | ICD-10-CM | POA: Insufficient documentation

## 2020-03-21 DIAGNOSIS — K2950 Unspecified chronic gastritis without bleeding: Secondary | ICD-10-CM | POA: Insufficient documentation

## 2020-03-21 DIAGNOSIS — K2970 Gastritis, unspecified, without bleeding: Secondary | ICD-10-CM

## 2020-03-21 DIAGNOSIS — Z1211 Encounter for screening for malignant neoplasm of colon: Secondary | ICD-10-CM | POA: Insufficient documentation

## 2020-03-21 DIAGNOSIS — D125 Benign neoplasm of sigmoid colon: Secondary | ICD-10-CM | POA: Diagnosis not present

## 2020-03-21 DIAGNOSIS — Z79899 Other long term (current) drug therapy: Secondary | ICD-10-CM | POA: Diagnosis not present

## 2020-03-21 DIAGNOSIS — D120 Benign neoplasm of cecum: Secondary | ICD-10-CM | POA: Diagnosis not present

## 2020-03-21 DIAGNOSIS — D696 Thrombocytopenia, unspecified: Secondary | ICD-10-CM | POA: Diagnosis not present

## 2020-03-21 DIAGNOSIS — K635 Polyp of colon: Secondary | ICD-10-CM | POA: Diagnosis not present

## 2020-03-21 HISTORY — PX: BIOPSY: SHX5522

## 2020-03-21 HISTORY — PX: COLONOSCOPY WITH PROPOFOL: SHX5780

## 2020-03-21 HISTORY — PX: POLYPECTOMY: SHX5525

## 2020-03-21 HISTORY — PX: ESOPHAGOGASTRODUODENOSCOPY (EGD) WITH PROPOFOL: SHX5813

## 2020-03-21 HISTORY — PX: HEMOSTASIS CLIP PLACEMENT: SHX6857

## 2020-03-21 LAB — TYPE AND SCREEN
ABO/RH(D): A POS
Antibody Screen: NEGATIVE

## 2020-03-21 LAB — ABO/RH: ABO/RH(D): A POS

## 2020-03-21 SURGERY — COLONOSCOPY WITH PROPOFOL
Anesthesia: Monitor Anesthesia Care

## 2020-03-21 MED ORDER — PROPOFOL 500 MG/50ML IV EMUL
INTRAVENOUS | Status: DC | PRN
  Administered 2020-03-21: 150 ug/kg/min via INTRAVENOUS

## 2020-03-21 MED ORDER — PROPOFOL 10 MG/ML IV BOLUS
INTRAVENOUS | Status: DC | PRN
  Administered 2020-03-21: 20 mg via INTRAVENOUS
  Administered 2020-03-21: 30 mg via INTRAVENOUS
  Administered 2020-03-21: 10 mg via INTRAVENOUS
  Administered 2020-03-21: 40 mg via INTRAVENOUS

## 2020-03-21 MED ORDER — PROPOFOL 10 MG/ML IV BOLUS
INTRAVENOUS | Status: AC
  Filled 2020-03-21: qty 20

## 2020-03-21 MED ORDER — SODIUM CHLORIDE 0.9 % IV SOLN: INTRAVENOUS | Status: DC

## 2020-03-21 MED ORDER — PROPOFOL 500 MG/50ML IV EMUL
INTRAVENOUS | Status: AC
  Filled 2020-03-21: qty 50

## 2020-03-21 MED ORDER — NADOLOL 20 MG PO TABS: 20.0000 mg | ORAL_TABLET | Freq: Every day | ORAL | 11 refills | Status: DC

## 2020-03-21 MED ORDER — LACTATED RINGERS IV SOLN: INTRAVENOUS | Status: DC

## 2020-03-21 MED ORDER — PHENYLEPHRINE 40 MCG/ML (10ML) SYRINGE FOR IV PUSH (FOR BLOOD PRESSURE SUPPORT)
PREFILLED_SYRINGE | INTRAVENOUS | Status: DC | PRN
  Administered 2020-03-21: 120 ug via INTRAVENOUS
  Administered 2020-03-21 (×2): 80 ug via INTRAVENOUS

## 2020-03-21 MED ORDER — SODIUM CHLORIDE 0.9% IV SOLUTION
Freq: Once | INTRAVENOUS | Status: AC
  Administered 2020-03-21: 250 mL via INTRAVENOUS

## 2020-03-21 SURGICAL SUPPLY — 25 items

## 2020-03-21 NOTE — Op Note (Signed)
[facility] Patient Name: Robert Ware Procedure Date: 03/21/2020 MRN: [id number] Attending MD: [name] , MD Date of Birth: [birth year] CSN: [id number] Age: 55 Admit Type: Outpatient Procedure:                Colonoscopy Indications:              Screening for colorectal malignant neoplasm, he                            received 2 units plts prior to this examination                            given chronically low plts (20s) Providers:                [name], MD, Cleda Daub, RN, Lesia Sago, Technician, Danley Danker, CRNA Referring MD:              Medicines:                Monitored Anesthesia Care Complications:            No immediate complications. Estimated blood loss:                            None. Estimated Blood Loss:     Estimated blood loss: none. Procedure:                Pre-Anesthesia Assessment:                           - Prior to the procedure, a History and Physical                            was performed, and patient medications and                            allergies were reviewed. The patient's tolerance of                            previous anesthesia was also reviewed. The risks                            and benefits of the procedure and the sedation                            options and risks were discussed with the patient.                            All questions were answered, and informed consent                            was obtained. Prior Anticoagulants: The patient has  taken no previous anticoagulant or antiplatelet                            agents. ASA Grade Assessment: IV - A patient with                            severe systemic disease that is a constant threat                            to life. After reviewing the risks and benefits,                            the patient was deemed in satisfactory condition to                             undergo the procedure.                           After obtaining informed consent, the colonoscope                            was passed under direct vision. Throughout the                            procedure, the patient's blood pressure, pulse, and                            oxygen saturations were monitored continuously. The                            CF-HQ190L ([id number]) Olympus colonoscope was                            introduced through the anus and advanced to the the                            cecum, identified by appendiceal orifice and                            ileocecal valve. The colonoscopy was performed                            without difficulty. The patient tolerated the                            procedure well. The quality of the bowel                            preparation was good. The ileocecal valve,                            appendiceal orifice, and rectum were photographed. Scope In: 11:51:45 AM Scope Out: 12:22:04 PM Scope Withdrawal Time: 0 hours 24 minutes 41 seconds  Total Procedure Duration: 0  hours 30 minutes 19 seconds  Findings:      Two sessile polyps were found in the cecum. The polyps were 3 to 6 mm in       size. These polyps were removed with a cold snare. Resection and       retrieval were complete. jar 1      One heaped up, inflammed appearing polyp in descending (71mm). This was       removed with snare, cautery jar 1.      One large, thickly pedunculated polyp was found in the sigmoid. This       measured 3cm across. I placed two overlapping Ultra Clips on the stalk       prior to resection. I noted blanching of the stalk and swelling of the       polyp head and then proceed with snare cautery. jar 2.      Prominant rectal vessels.      The exam was otherwise without abnormality on direct and retroflexion       views. Impression:               - Four polyps, all removed and sent to pathology.                           - Prominant rectal  vessels. Moderate Sedation:      Not Applicable - Patient had care per Anesthesia. Recommendation:           - EGD now to screen for varices. Procedure Code(s):        --- Professional ---                           [phone], Colonoscopy, flexible; with removal of                            tumor(s), polyp(s), or other lesion(s) by snare                            technique Diagnosis Code(s):        --- Professional ---                           Z12.11, Encounter for screening for malignant                            neoplasm of colon                           K63.5, Polyp of colon CPT copyright 2019 American Medical Association. All rights reserved. The codes documented in this report are preliminary and upon coder review may  be revised to meet current compliance requirements. [name], MD 03/21/2020 12:28:11 PM This report has been signed electronically. Number of Addenda: 0

## 2020-03-21 NOTE — Anesthesia Postprocedure Evaluation (Signed)
Anesthesia Post Note  Patient: Robert Ware  Procedure(s) Performed: COLONOSCOPY WITH PROPOFOL (N/A ) ESOPHAGOGASTRODUODENOSCOPY (EGD) WITH PROPOFOL (N/A ) POLYPECTOMY HEMOSTASIS CLIP PLACEMENT BIOPSY     Patient location during evaluation: PACU Anesthesia Type: MAC Level of consciousness: awake and alert Pain management: pain level controlled Vital Signs Assessment: post-procedure vital signs reviewed and stable Respiratory status: spontaneous breathing, nonlabored ventilation and respiratory function stable Cardiovascular status: blood pressure returned to baseline and stable Postop Assessment: no apparent nausea or vomiting Anesthetic complications: no   No complications documented.  Last Vitals:  Vitals:   03/21/20 1245 03/21/20 1250  BP: (!) 95/42 (!) 95/40  Pulse: 68 65  Resp: 15 17  Temp: 36.8 C   SpO2: 100% 100%    Last Pain:  Vitals:   03/21/20 1250  TempSrc:   PainSc: [place]

## 2020-03-21 NOTE — Anesthesia Procedure Notes (Signed)
Date/Time: 03/21/2020 11:44 AM Performed by: Sharlette Dense, CRNA Oxygen Delivery Method: Simple face mask

## 2020-03-21 NOTE — Interval H&P Note (Signed)
History and Physical Interval Note:  03/21/2020 11:24 AM  Robert Ware  has presented today for surgery, with the diagnosis of colon cancer screening, varices screening, cirrhosis.  The various methods of treatment have been discussed with the patient and family. After consideration of risks, benefits and other options for treatment, the patient has consented to  Procedure(s): COLONOSCOPY WITH PROPOFOL (N/A) ESOPHAGOGASTRODUODENOSCOPY (EGD) WITH PROPOFOL (N/A) as a surgical intervention.  The patient's history has been reviewed, patient examined, no change in status, stable for surgery.  I have reviewed the patient's chart and labs.  Questions were answered to the patient's satisfaction.     [name]

## 2020-03-21 NOTE — Anesthesia Preprocedure Evaluation (Addendum)
Anesthesia Evaluation  Patient identified by MRN, date of birth, ID band Patient awake    Reviewed: Allergy & Precautions, NPO status , Patient's Chart, lab work & pertinent test results  Airway Mallampati: II  TM Distance: >3 FB Neck ROM: Full    Dental no notable dental hx. (+) Teeth Intact, Dental Advisory Given   Pulmonary neg pulmonary ROS,    Pulmonary exam normal breath sounds clear to auscultation       Cardiovascular negative cardio ROS Normal cardiovascular exam Rhythm:Regular Rate:Normal     Neuro/Psych  Headaches, negative psych ROS   GI/Hepatic GERD  Controlled and Medicated,(+) Cirrhosis   Esophageal Varices    , Cirrhosis w/ esophageal varices and thrombocytopenia (normally in 20s-30s) INR 1.3   Endo/Other  negative endocrine ROS  Renal/GU negative Renal ROS  negative genitourinary   Musculoskeletal negative musculoskeletal ROS (+)   Abdominal Normal abdominal exam  (+)   Peds  Hematology  (+) HIV (well controlled),   Anesthesia Other Findings   Reproductive/Obstetrics negative OB ROS                            Anesthesia Physical Anesthesia Plan  ASA: IV  Anesthesia Plan: MAC   Post-op Pain Management:    Induction:   PONV Risk Score and Plan: 2 and Propofol infusion and TIVA  Airway Management Planned: Natural Airway and Simple Face Mask  Additional Equipment: None  Intra-op Plan:   Post-operative Plan:   Informed Consent: I have reviewed the patients History and Physical, chart, labs and discussed the procedure including the risks, benefits and alternatives for the proposed anesthesia with the patient or authorized representative who has indicated his/her understanding and acceptance.       Plan Discussed with: CRNA  Anesthesia Plan Comments:        Anesthesia Quick Evaluation

## 2020-03-21 NOTE — Telephone Encounter (Signed)
-----   Message from [name], MD sent at 03/21/2020 12:55 PM EST ----- He needs CMA or RN visit in 2 weeks to check HR, BP because he is starting nadolol today.  Also needs OV with me in 2 months for cirrhosis follow up.  Thanks

## 2020-03-21 NOTE — Discharge Instructions (Signed)
YOU HAD AN ENDOSCOPIC PROCEDURE TODAY: Refer to the procedure report and other information in the discharge instructions given to you for any specific questions about what was found during the examination. If this information does not answer your questions, please call [place] office at [phone] to clarify.  ° °YOU SHOULD EXPECT: Some feelings of bloating in the abdomen. Passage of more gas than usual. Walking can help get rid of the air that was put into your GI tract during the procedure and reduce the bloating. If you had a lower endoscopy (such as a colonoscopy or flexible sigmoidoscopy) you may notice spotting of blood in your stool or on the toilet paper. Some abdominal soreness may be present for a day or two, also. ° °DIET: Your first meal following the procedure should be a light meal and then it is ok to progress to your normal diet. A half-sandwich or bowl of soup is an example of a good first meal. Heavy or fried foods are harder to digest and may make you feel nauseous or bloated. Drink plenty of fluids but you should avoid alcoholic beverages for 24 hours. If you had a esophageal dilation, please see attached instructions for diet.   ° °ACTIVITY: Your care partner should take you home directly after the procedure. You should plan to take it easy, moving slowly for the rest of the day. You can resume normal activity the day after the procedure however YOU SHOULD NOT DRIVE, use power tools, machinery or perform tasks that involve climbing or major physical exertion for 24 hours (because of the sedation medicines used during the test).  ° °SYMPTOMS TO REPORT IMMEDIATELY: °A gastroenterologist can be reached at any hour. Please call [phone]  for any of the following symptoms:  °Following lower endoscopy (colonoscopy, flexible sigmoidoscopy) °Excessive amounts of blood in the stool  °Significant tenderness, worsening of abdominal pains  °Swelling of the abdomen that is new, acute  °Fever of 100° or  higher  °Following upper endoscopy (EGD, EUS, ERCP, esophageal dilation) °Vomiting of blood or coffee ground material  °New, significant abdominal pain  °New, significant chest pain or pain under the shoulder blades  °Painful or persistently difficult swallowing  °New shortness of breath  °Black, tarry-looking or red, bloody stools ° °FOLLOW UP:  °If any biopsies were taken you will be contacted by phone or by letter within the next 1-3 weeks. Call [phone]  if you have not heard about the biopsies in 3 weeks.  °Please also call with any specific questions about appointments or follow up tests. ° °

## 2020-03-21 NOTE — Telephone Encounter (Signed)
The pt has an appt with nurse on 04/04/20 at 2 pm for BR HR check.    Appt with Dr Ardis Hughs on 05/20/20 at 150 pm   Left message on machine to call back

## 2020-03-21 NOTE — Op Note (Signed)
[facility] Patient Name: Robert Ware Procedure Date: 03/21/2020 MRN: [id number] Attending MD: [name] , MD Date of Birth: 1965/09/17 CSN: [id number] Age: 55 Admit Type: Outpatient Procedure:                Upper GI endoscopy Indications:              Cirrhosis, screening for varices Providers:                [name], MD, Cleda Daub, RN, Lesia Sago, Technician, Danley Danker, CRNA Referring MD:              Medicines:                Monitored Anesthesia Care Complications:            No immediate complications. Estimated blood loss:                            None. Estimated Blood Loss:     Estimated blood loss: none. Procedure:                Pre-Anesthesia Assessment:                           - Prior to the procedure, a History and Physical                            was performed, and patient medications and                            allergies were reviewed. The patient's tolerance of                            previous anesthesia was also reviewed. The risks                            and benefits of the procedure and the sedation                            options and risks were discussed with the patient.                            All questions were answered, and informed consent                            was obtained. Prior Anticoagulants: The patient has                            taken no previous anticoagulant or antiplatelet                            agents. ASA Grade Assessment: IV - A patient with  severe systemic disease that is a constant threat                            to life. After reviewing the risks and benefits,                            the patient was deemed in satisfactory condition to                            undergo the procedure.                           After obtaining informed consent, the endoscope was                            passed under  direct vision. Throughout the                            procedure, the patient's blood pressure, pulse, and                            oxygen saturations were monitored continuously. The                            [id number]-[id number] ([id number] Olympus gastroscope was                            introduced through the mouth, and advanced to the                            second part of duodenum. The upper GI endoscopy was                            accomplished without difficulty. The patient                            tolerated the procedure well. Scope In: Scope Out: Findings:      Two trunks of medium to large distal esophagus varices without signs of       recent bleeding.      Typical appearing mild portal gastropathy changes throughout the stomach       (predominantly in the proximal stomach).      No gastric varices.      Moderate to severe inflammation characterized by erythema, friability       and granularity was found in the distal 2/3 of the stomach (atypical       portal gastropathy?). Biopsies were taken with a cold forceps for       histology.      The exam was otherwise without abnormality. Impression:               - Medium to large esophageal varices.                           - Portal gastropathy. No gastric varices.                           -  Moderate to severe distal gastritis changes,                            biopsies taken to check for H. pylori. Moderate Sedation:      Not Applicable - Patient had care per Anesthesia. Recommendation:           - Patient has a contact number available for                            emergencies. The signs and symptoms of potential                            delayed complications were discussed with the                            patient. Return to normal activities tomorrow.                            Written discharge instructions were provided to the                            patient.                           - Resume previous  diet.                           - Continue present medications.                           - New start nadolol 20mg  pills, one pill once daily                            for primary prophylaxis for variceal bleeding.                           - My office will contact him about RN visit for BP                            and HR check in 2 weeks as well as OV with Dr.                            Ardis Hughs in 2 months for continued cirrhosis care. Procedure Code(s):        --- Professional ---                           [id number], Esophagogastroduodenoscopy, flexible,                            transoral; with biopsy, single or multiple Diagnosis Code(s):        --- Professional ---                           K29.70, Gastritis, unspecified, without bleeding  R12, Heartburn CPT copyright 2019 American Medical Association. All rights reserved. The codes documented in this report are preliminary and upon coder review may  be revised to meet current compliance requirements. [name], MD 03/21/2020 12:46:16 PM This report has been signed electronically. Number of Addenda: 0

## 2020-03-21 NOTE — Transfer of Care (Signed)
Immediate Anesthesia Transfer of Care Note  Patient: Robert Ware  Procedure(s) Performed: COLONOSCOPY WITH PROPOFOL (N/A ) ESOPHAGOGASTRODUODENOSCOPY (EGD) WITH PROPOFOL (N/A ) POLYPECTOMY HEMOSTASIS CLIP PLACEMENT BIOPSY  Patient Location: Endoscopy Unit  Anesthesia Type:MAC  Level of Consciousness: drowsy  Airway & Oxygen Therapy: Patient Spontanous Breathing and Patient connected to face mask oxygen  Post-op Assessment: Report given to RN and Post -op Vital signs reviewed and stable  Post vital signs: Reviewed and stable  Last Vitals:  Vitals Value Taken Time  BP    Temp    Pulse    Resp    SpO2      Last Pain:  Vitals:   03/21/20 1115  TempSrc: Oral  PainSc:          Complications: No complications documented.

## 2020-03-22 ENCOUNTER — Telehealth: Payer: Self-pay

## 2020-03-22 ENCOUNTER — Other Ambulatory Visit: Payer: Self-pay | Admitting: Gastroenterology

## 2020-03-22 ENCOUNTER — Telehealth: Payer: Self-pay | Admitting: Gastroenterology

## 2020-03-22 LAB — BPAM PLATELET PHERESIS
Blood Product Expiration Date: 202202252359
Blood Product Expiration Date: 202202252359
ISSUE DATE / TIME: 202202240859
ISSUE DATE / TIME: 202202241011
Unit Type and Rh: 5100
Unit Type and Rh: 5100

## 2020-03-22 LAB — PREPARE PLATELET PHERESIS
Unit division: 0
Unit division: 0

## 2020-03-22 LAB — SURGICAL PATHOLOGY

## 2020-03-22 MED ORDER — NADOLOL 20 MG PO TABS: 20.0000 mg | ORAL_TABLET | Freq: Every day | ORAL | 11 refills | Status: DC

## 2020-03-22 MED FILL — NADOLOL 20 MG TABS: 20 | 30 days supply | Qty: 30 | Fill #0

## 2020-03-22 NOTE — Telephone Encounter (Signed)
The pt has been advised of the appts.  He prefers a morning appt for the nurse visit. I have changed it to 8 am.  He will call with any further concerns.

## 2020-03-22 NOTE — Telephone Encounter (Signed)
Patient aware that Nadolol was sent to [facility].

## 2020-03-22 NOTE — Telephone Encounter (Signed)
Received call from patient, he states he was unable to pick up his nadolol from the pharmacy. He states the pharmacy told him his insurance would not cover it. RN advised patient that this medication was prescribed by Dr. Audelia Acton office and that he should call them to let them know he is having trouble with his insurance covering the medication. RN provided patient with their office's phone number. Patient asking if it is okay to take his Prezista and Genvoya with the nadolol. RN advised patient to notify Dr. Audelia Acton office that he is on [place] and to ask them if it is okay to take the nadolol with those two medications. Patient verbalized understanding and has no further questions.    Beryle Flock, RN

## 2020-03-22 NOTE — Telephone Encounter (Signed)
Patient is requesting we resend the Nadolol script to [place] for it is cheaper fr him there.

## 2020-03-22 NOTE — Telephone Encounter (Signed)
Patient calling to follow up on script request

## 2020-03-22 NOTE — Telephone Encounter (Signed)
Patient asking if it is okay to take his Prezista and Genvoya with the nadolol?    Please advise.   Thank you

## 2020-03-25 ENCOUNTER — Encounter (HOSPITAL_COMMUNITY): Payer: Self-pay | Admitting: Gastroenterology

## 2020-03-25 ENCOUNTER — Ambulatory Visit: Payer: 59

## 2020-03-25 ENCOUNTER — Other Ambulatory Visit: Payer: Self-pay

## 2020-03-25 NOTE — Telephone Encounter (Signed)
Phone call to patient to make him aware that it should be safe for him to take Nadolol, Prezista, and Genvoya per Dr Ardis Hughs.  Patient verbalized understanding.  No further questions.

## 2020-03-25 NOTE — Telephone Encounter (Signed)
Yes, that should be safe 

## 2020-03-26 ENCOUNTER — Other Ambulatory Visit (HOSPITAL_COMMUNITY): Payer: Self-pay | Admitting: Gastroenterology

## 2020-03-26 ENCOUNTER — Other Ambulatory Visit: Payer: Self-pay

## 2020-03-26 ENCOUNTER — Telehealth: Payer: Self-pay | Admitting: Gastroenterology

## 2020-03-26 MED ORDER — PYLERA 140-125-125 MG PO CAPS: 3.0000 | ORAL_CAPSULE | Freq: Three times a day (TID) | ORAL | 0 refills | Status: DC

## 2020-03-26 MED ORDER — AMOXICILLIN 500 MG PO TABS: 1000.0000 mg | ORAL_TABLET | Freq: Two times a day (BID) | ORAL | 0 refills | Status: AC

## 2020-03-26 MED ORDER — OMEPRAZOLE 20 MG PO CPDR: 20.0000 mg | DELAYED_RELEASE_CAPSULE | Freq: Two times a day (BID) | ORAL | 0 refills | Status: DC

## 2020-03-26 MED ORDER — CLARITHROMYCIN 500 MG PO TABS: 500.0000 mg | ORAL_TABLET | Freq: Two times a day (BID) | ORAL | 0 refills | Status: DC

## 2020-03-26 MED FILL — CLARITHROMYCIN 500 MG TAB: 500 | 10 days supply | Qty: 20 | Fill #0

## 2020-03-26 MED FILL — OMEPRAZOLE 20 MG CAP: 20 | 10 days supply | Qty: 20 | Fill #0

## 2020-03-26 MED FILL — AMOXICILLIN 500 MG CAPSULE: 500 | 10 days supply | Qty: 40 | Fill #0

## 2020-03-26 NOTE — Telephone Encounter (Signed)
Individual components sent to the pharmacy for H pylori treatment.  Message left notifying the pt.

## 2020-03-26 NOTE — Telephone Encounter (Signed)
Pt called stating that Pylera needs PA.

## 2020-04-04 ENCOUNTER — Ambulatory Visit: Payer: 59 | Admitting: Gastroenterology

## 2020-04-04 VITALS — BP 126/82 | HR 59

## 2020-04-04 DIAGNOSIS — K7460 Unspecified cirrhosis of liver: Secondary | ICD-10-CM

## 2020-04-15 ENCOUNTER — Telehealth: Payer: Self-pay

## 2020-04-15 NOTE — Telephone Encounter (Signed)
Patient notified heart rate and blood pressures are acceptable and no changes in medications for now per Dr Ardis Hughs.  Patient agreed to plan and verbalized understanding.

## 2020-04-15 NOTE — Telephone Encounter (Signed)
-----   Message from [name], MD sent at 04/13/2020  7:14 AM EDT ----- His BP and HR are accepatable.  HR just barely.    No changes in his meds for now.   ----- Message ----- From: Stevan Born, CMA Sent: 04/04/2020   3:52 PM EDT To: [name], MD

## 2020-04-19 MED FILL — NADOLOL 20 MG TAB: 20 | 30 days supply | Qty: 30 | Fill #1

## 2020-04-22 ENCOUNTER — Telehealth: Payer: Self-pay | Admitting: Gastroenterology

## 2020-04-22 NOTE — Telephone Encounter (Signed)
Any suggestions Sir?

## 2020-04-23 ENCOUNTER — Other Ambulatory Visit: Payer: Self-pay | Admitting: Gastroenterology

## 2020-04-23 MED ORDER — PROPRANOLOL HCL 10 MG PO TABS: 10.0000 mg | ORAL_TABLET | Freq: Three times a day (TID) | ORAL | 1 refills | Status: DC

## 2020-04-23 NOTE — Telephone Encounter (Signed)
I spoke with Robert Ware and he said he borrowed money from his sister to get the nadolol yesterday so he wouldn't miss any doses. He wants me to send the new rx to [place] and he will start it next month. He understands not to take both.

## 2020-04-23 NOTE — Telephone Encounter (Signed)
Please offer him propranolol 10 mg 3 times daily instead of the nadolol.  Give him 6 months worth.  Thank you

## 2020-04-26 ENCOUNTER — Encounter: Payer: Self-pay | Admitting: Internal Medicine

## 2020-04-27 ENCOUNTER — Other Ambulatory Visit (HOSPITAL_COMMUNITY): Payer: Self-pay

## 2020-04-30 ENCOUNTER — Other Ambulatory Visit (HOSPITAL_COMMUNITY): Payer: Self-pay

## 2020-04-30 MED FILL — Propranolol HCl Tab 10 MG: ORAL | 90 days supply | Qty: 270 | Fill #0 | Status: AC

## 2020-05-01 ENCOUNTER — Other Ambulatory Visit (HOSPITAL_COMMUNITY): Payer: Self-pay

## 2020-05-20 ENCOUNTER — Ambulatory Visit: Payer: 59 | Admitting: Gastroenterology

## 2020-07-15 ENCOUNTER — Ambulatory Visit: Payer: 59 | Admitting: Gastroenterology

## 2020-07-22 ENCOUNTER — Encounter: Payer: Self-pay | Admitting: Gastroenterology

## 2020-07-22 ENCOUNTER — Other Ambulatory Visit (INDEPENDENT_AMBULATORY_CARE_PROVIDER_SITE_OTHER): Payer: 59

## 2020-07-22 ENCOUNTER — Other Ambulatory Visit: Payer: Self-pay

## 2020-07-22 ENCOUNTER — Other Ambulatory Visit (HOSPITAL_COMMUNITY): Payer: Self-pay

## 2020-07-22 ENCOUNTER — Ambulatory Visit (INDEPENDENT_AMBULATORY_CARE_PROVIDER_SITE_OTHER): Payer: 59 | Admitting: Gastroenterology

## 2020-07-22 VITALS — BP 132/80 | HR 64 | Ht 74.0 in | Wt 250.6 lb

## 2020-07-22 DIAGNOSIS — K7460 Unspecified cirrhosis of liver: Secondary | ICD-10-CM

## 2020-07-22 LAB — CBC WITH DIFFERENTIAL/PLATELET
Basophils Absolute: 0 10*3/uL (ref 0.0–0.1)
Basophils Relative: 1.1 % (ref 0.0–3.0)
Eosinophils Absolute: 0 10*3/uL (ref 0.0–0.7)
Eosinophils Relative: 1.2 % (ref 0.0–5.0)
HCT: 37.2 % — ABNORMAL LOW (ref 39.0–52.0)
Hemoglobin: 12.4 g/dL — ABNORMAL LOW (ref 13.0–17.0)
Lymphocytes Relative: 36.2 % (ref 12.0–46.0)
Lymphs Abs: 0.8 10*3/uL (ref 0.7–4.0)
MCHC: 33.3 g/dL (ref 30.0–36.0)
MCV: 79.1 fl (ref 78.0–100.0)
Monocytes Absolute: 0.2 10*3/uL (ref 0.1–1.0)
Monocytes Relative: 7.9 % (ref 3.0–12.0)
Neutro Abs: 1.3 10*3/uL — ABNORMAL LOW (ref 1.4–7.7)
Neutrophils Relative %: 53.6 % (ref 43.0–77.0)
Platelets: 25 10*3/uL — CL (ref 150.0–400.0)
RBC: 4.7 Mil/uL (ref 4.22–5.81)
RDW: 19.4 % — ABNORMAL HIGH (ref 11.5–15.5)
WBC: 2.3 10*3/uL — ABNORMAL LOW (ref 4.0–10.5)

## 2020-07-22 LAB — COMPREHENSIVE METABOLIC PANEL
ALT: 49 U/L (ref 0–53)
AST: 66 U/L — ABNORMAL HIGH (ref 0–37)
Albumin: 3.5 g/dL (ref 3.5–5.2)
Alkaline Phosphatase: 59 U/L (ref 39–117)
BUN: 8 mg/dL (ref 6–23)
CO2: 26 mEq/L (ref 19–32)
Calcium: 8.7 mg/dL (ref 8.4–10.5)
Chloride: 107 mEq/L (ref 96–112)
Creatinine, Ser: 0.87 mg/dL (ref 0.40–1.50)
GFR: 96.74 mL/min (ref >60.00)
Glucose, Bld: 96 mg/dL (ref 70–99)
Potassium: 4 mEq/L (ref 3.5–5.1)
Sodium: 139 mEq/L (ref 135–145)
Total Bilirubin: 1.7 mg/dL — ABNORMAL HIGH (ref 0.2–1.2)
Total Protein: 6.9 g/dL (ref 6.0–8.3)

## 2020-07-22 LAB — PROTIME-INR
INR: 1.4 ratio — ABNORMAL HIGH (ref 0.8–1.0)
Prothrombin Time: 15.9 s — ABNORMAL HIGH (ref 9.6–13.1)

## 2020-07-22 MED ORDER — FUROSEMIDE 40 MG PO TABS
40.0000 mg | ORAL_TABLET | Freq: Every day | ORAL | 1 refills | Status: DC
  Filled 2020-07-22: qty 30, 30d supply, fill #0

## 2020-07-22 MED ORDER — SPIRONOLACTONE 100 MG PO TABS
100.0000 mg | ORAL_TABLET | Freq: Every day | ORAL | 1 refills | Status: DC
Start: 2020-07-22 — End: 2020-11-13
  Filled 2020-07-22: qty 30, 30d supply, fill #0

## 2020-07-22 NOTE — Patient Instructions (Signed)
Your provider has requested that you go to the basement level for lab work before leaving today. Press "B" on the elevator. The lab is located at the first door on the left as you exit the elevator.  We have sent the following medications to your pharmacy for you to pick up at your convenience:furosemide and aldactone.   Please remain on a low sodium diet.   You will be contacted by [place] in the next 2 days to arrange a RUQ abdominal ultrasound.  The number on your caller ID will be [phone], please answer when they call.  If you have not heard from them in 2 days please call [phone] to schedule.     Due to recent changes in healthcare laws, you may see the results of your imaging and laboratory studies on MyChart before your provider has had a chance to review them.  We understand that in some cases there may be results that are confusing or concerning to you. Not all laboratory results come back in the same time frame and the provider may be waiting for multiple results in order to interpret others.  Please give [place] 48 hours in order for your provider to thoroughly review all the results before contacting the office for clarification of your results.   The [facility] providers would like to encourage you to use [facility] to communicate with providers for non-urgent requests or questions.  Due to long hold times on the telephone, sending your provider a message by [facility] may be a faster and more efficient way to get a response.  Please allow 48 business hours for a response.  Please remember that this is for non-urgent requests.   Normal BMI (Body Mass Index- based on height and weight) is between 19 and 25. Your BMI today is Body mass index is 32.18 kg/m. Marland Kitchen Please consider follow up  regarding your BMI with your Primary Care Provider.  Please follow up with Dr. Ardis Hughs on 09/04/20 at 11:10am.

## 2020-07-22 NOTE — Progress Notes (Signed)
Review of pertinent gastrointestinal problems: 1.  Adenomatous colon polyps.  Colonoscopy February 2022 multiple polyps including larger, high risk adenomas.  Repeat colonoscopy at 3-year interval recommended. 2.  Cirrhosis (slightly elevated liver transaminases, chronically low platelets for many years, INR 1.3, unclear etiology, never alcohol drinker) Laboratory work-up January 2022 hepatitis B,C negative, + immune to Hep A.,  ANA negative, iron studies not indicative of iron overload, ceruloplasmin normal, anti-smooth muscle antibody negative antimitochondrial antibody negative, alpha-1 antitrypsin level normal, EGD February 2022 medium to large esophageal varices noted, also portal gastropathy.  Started on nadolol 20mg  daily.  H. pylori positive gastritis was proven and he was put on appropriate antibiotics. Ultrasound January 2022 cirrhosis without focal mass lesions. Hepatitis B immunization series needed Alpha-fetoprotein January 2022 3.9, normal 3. HIV under excellent control, sees infectious disease.   HPI: This is a very pleasant 55 year old man whom I last saw around the time of a colonoscopy and upper endoscopy.  See those results summarized above  His weight is up 19 pounds since his last office visit here about 5 months ago.  He has noticed swelling in his legs, lower extremities, right greater than left.  Also swelling in his abdomen has developed.  This is causing him to have difficulty working.  He is not having short of breath but he has noticed some wheezing at times.  He does not really watch his sodium intake very well.  He has been quite compliant about taking his propranolol 20 mg once daily and he did complete his Pylera treatment for H. pylori.  He has had no overt GI bleeding  ROS: complete GI ROS as described in HPI, all other review negative.  Constitutional:  No unintentional weight loss   Past Medical History:  Diagnosis Date   Cirrhosis ([place])    PT DENIES    Condyloma    ED (erectile dysfunction)    Elevated liver enzymes    GERD (gastroesophageal reflux disease)    HIV (human immunodeficiency virus infection) ([place])    Seborrhea    Splenomegaly    Thrombocytopenia ([place])    Warts     Past Surgical History:  Procedure Laterality Date   BIOPSY  03/21/2020   Procedure: BIOPSY;  Surgeon: [name], MD;  Location: [facility];  Service: Endoscopy;;   COLONOSCOPY WITH PROPOFOL N/A 03/21/2020   Procedure: COLONOSCOPY WITH PROPOFOL;  Surgeon: [name], MD;  [facility];  Service: Endoscopy;  Laterality: N/A;   CONDYLOMA EXCISION/FULGURATION  2007   CONDYLOMA EXCISION/FULGURATION N/A 07/01/2018   Procedure: CONDYLOMA REMOVAL;  Surgeon: Franchot Gallo, MD;  Location: [facility];  Service: Urology;  Laterality: N/A;   ESOPHAGOGASTRODUODENOSCOPY (EGD) WITH PROPOFOL N/A 03/21/2020   Procedure: ESOPHAGOGASTRODUODENOSCOPY (EGD) WITH PROPOFOL;  Surgeon: [name], MD;  [facility];  Service: Endoscopy;  Laterality: N/A;   HEMOSTASIS CLIP PLACEMENT  03/21/2020   Procedure: HEMOSTASIS CLIP PLACEMENT;  Surgeon: [name], MD;  [facility];  Service: Endoscopy;;   POLYPECTOMY  03/21/2020   Procedure: POLYPECTOMY;  Surgeon: [name], MD;  [facility];  Service: Endoscopy;;    Current Outpatient Medications  Medication Sig Dispense Refill   clotrimazole (LOTRIMIN) 1 % cream Apply 1 application topically 2 (two) times daily. (Patient taking differently: Apply 1 application topically 2 (two) times daily as needed (irritation).) 30 g 3   darunavir (PREZISTA) 800 MG tablet TAKE 1 TABLET(800 MG) BY MOUTH DAILY WITH BREAKFAST (Patient taking differently:  Take 800 mg by mouth daily with breakfast.) 30 tablet 11   propranolol (INDERAL) 10 MG tablet TAKE 1 TABLET BY MOUTH 3 TIMES DAILY 270 tablet 1   elvitegravir-cobicistat-emtricitabine-tenofovir (GENVOYA) 150-150-200-10  MG TABS tablet Take 1 tablet by mouth daily with breakfast. 30 tablet 11   No current facility-administered medications for this visit.    Allergies as of 07/22/2020   (No Known Allergies)    Family History  Problem Relation Age of Onset   Diabetes Mother    Hypertension Mother    Stroke Father    Colon polyps Neg Hx    Colon cancer Neg Hx    Esophageal cancer Neg Hx    Pancreatic cancer Neg Hx    Liver disease Neg Hx    Stomach cancer Neg Hx     Social History   Socioeconomic History   Marital status: Legally Separated    Spouse name: Not on file   Number of children: Not on file   Years of education: Not on file   Highest education level: Not on file  Occupational History   Not on file  Tobacco Use   Smoking status: Never   Smokeless tobacco: Never  Vaping Use   Vaping Use: Never used  Substance and Sexual Activity   Alcohol use: Yes    Comment: occasional   Drug use: No   Sexual activity: Not Currently    Comment: offered condoms  Other Topics Concern   Not on file  Social History Narrative   Not on file   Social Determinants of Health   Financial Resource Strain: Not on file  Food Insecurity: Not on file  Transportation Needs: Not on file  Physical Activity: Not on file  Stress: Not on file  Social Connections: Not on file  Intimate Partner Violence: Not on file     Physical Exam: BP 132/80   Pulse 64   Ht 6\' 2"  (1.88 m)   Wt 250 lb 9.6 oz (113.7 kg)   BMI 32.18 kg/m  Constitutional: generally well-appearing Psychiatric: alert and oriented x3 Abdomen: soft, nontender, nondistended, likely underlying ascites, no peritoneal signs, normal bowel sounds 1+ pitting edema right greater than left ankle  Assessment and plan: 55 y.o. male with cirrhosis, unclear etiology, new development of fluid overload, edema  First I think it is most importantly we get his fluid status under control.  He has gained almost 20 pounds since his last office visit  here 6 months ago.  He understands now that he should really be watching his sodium intake and have a low sodium diet overall.  I am going to start him on typical diuretic regimen of Aldactone 100 mg once daily and Lasix 40 mg once daily.  He will get labs before starting his diuretics with CBC, complete metabolic profile, coags and alpha-fetoprotein.  He needs hepatoma screening with right upper quadrant ultrasound and we will order that for him as well.  I would like to see him back in our office here in 4 to 6 weeks, he understands will probably be adjusting his diuretic regimen in the meantime.  He is going to eventually need hepatitis C immunization and to be checked for H. pylori eradication.  I think I can wait.  His fluid status under control.  His pulse is in the low 60s, I do not want to increase his nonselective beta-blocker any further just yet.  Please see the "Patient Instructions" section for addition details about the plan.  Owens Loffler, MD [facility] 07/22/2020, 9:21 AM   Total time on date of encounter was 35 minutes (this included time spent preparing to see the patient reviewing records; obtaining and/or reviewing separately obtained history; performing a medically appropriate exam and/or evaluation; counseling and educating the patient and family if present; ordering medications, tests or procedures if applicable; and documenting clinical information in the health record).

## 2020-07-23 ENCOUNTER — Other Ambulatory Visit (HOSPITAL_COMMUNITY): Payer: Self-pay

## 2020-07-23 LAB — AFP TUMOR MARKER: AFP-Tumor Marker: 3.2 ng/mL (ref <6.1)

## 2020-07-24 ENCOUNTER — Other Ambulatory Visit: Payer: Self-pay

## 2020-07-24 DIAGNOSIS — K7460 Unspecified cirrhosis of liver: Secondary | ICD-10-CM

## 2020-07-31 ENCOUNTER — Other Ambulatory Visit: Payer: Self-pay

## 2020-07-31 ENCOUNTER — Ambulatory Visit (HOSPITAL_COMMUNITY)
Admission: RE | Admit: 2020-07-31 | Discharge: 2020-07-31 | Disposition: A | Discharge status: Home / Self Care | Payer: 59 | Source: Ambulatory Visit | Attending: Gastroenterology | Admitting: Gastroenterology

## 2020-07-31 DIAGNOSIS — K7460 Unspecified cirrhosis of liver: Secondary | ICD-10-CM | POA: Insufficient documentation

## 2020-08-02 ENCOUNTER — Ambulatory Visit: Payer: 59

## 2020-08-05 ENCOUNTER — Other Ambulatory Visit: Payer: Self-pay

## 2020-08-05 ENCOUNTER — Ambulatory Visit: Payer: 59

## 2020-08-09 ENCOUNTER — Other Ambulatory Visit (INDEPENDENT_AMBULATORY_CARE_PROVIDER_SITE_OTHER): Payer: 59

## 2020-08-09 DIAGNOSIS — K7460 Unspecified cirrhosis of liver: Secondary | ICD-10-CM | POA: Diagnosis not present

## 2020-08-09 LAB — BASIC METABOLIC PANEL
BUN: 14 mg/dL (ref 6–23)
CO2: 28 mEq/L (ref 19–32)
Calcium: 9.8 mg/dL (ref 8.4–10.5)
Chloride: 101 mEq/L (ref 96–112)
Creatinine, Ser: 1.13 mg/dL (ref 0.40–1.50)
GFR: 72.85 mL/min (ref >60.00)
Glucose, Bld: 100 mg/dL — ABNORMAL HIGH (ref 70–99)
Potassium: 4.4 mEq/L (ref 3.5–5.1)
Sodium: 134 mEq/L — ABNORMAL LOW (ref 135–145)

## 2020-08-12 ENCOUNTER — Other Ambulatory Visit: Payer: Self-pay

## 2020-08-12 DIAGNOSIS — K7460 Unspecified cirrhosis of liver: Secondary | ICD-10-CM

## 2020-08-29 ENCOUNTER — Other Ambulatory Visit (INDEPENDENT_AMBULATORY_CARE_PROVIDER_SITE_OTHER): Payer: 59

## 2020-08-29 DIAGNOSIS — K7460 Unspecified cirrhosis of liver: Secondary | ICD-10-CM | POA: Diagnosis not present

## 2020-08-29 LAB — BASIC METABOLIC PANEL
BUN: 12 mg/dL (ref 6–23)
CO2: 20 mEq/L (ref 19–32)
Calcium: 9.1 mg/dL (ref 8.4–10.5)
Chloride: 105 mEq/L (ref 96–112)
Creatinine, Ser: 0.98 mg/dL (ref 0.40–1.50)
GFR: 86.39 mL/min (ref >60.00)
Glucose, Bld: 98 mg/dL (ref 70–99)
Potassium: 4.2 mEq/L (ref 3.5–5.1)
Sodium: 135 mEq/L (ref 135–145)

## 2020-09-04 ENCOUNTER — Ambulatory Visit: Payer: 59 | Admitting: Gastroenterology

## 2020-09-04 ENCOUNTER — Telehealth: Payer: Self-pay | Admitting: Gastroenterology

## 2020-09-04 ENCOUNTER — Telehealth: Payer: Self-pay

## 2020-09-04 NOTE — Telephone Encounter (Signed)
Inbound call from patient requesting a call from a nurse please.  Had an appt scheduled for today but canceled it.  Did not specify further as to why needing a call.

## 2020-09-04 NOTE — Telephone Encounter (Signed)
FYI The pt cancelled the appt today due to positive COVID. He has been rescheduled to next available.

## 2020-09-04 NOTE — Telephone Encounter (Signed)
Patient scheduled for a phone visit tomorrow and aware of his appointment. Patient instructed to continue quarantining and if he starts experiencing SOB or symptoms get worse he needs to go to the ER. Patient verbalized understanding. Alaena Strader T Brooks Sailors

## 2020-09-04 NOTE — Telephone Encounter (Signed)
Patient called stating he tested positive for COVID at you [facility] yesterday. Patient states his symptoms started 2 days ago. Patient currently having fever, chills, and cough. Patient denies any other symptoms.  Robert Ware Brooks Sailors

## 2020-09-05 ENCOUNTER — Telehealth: Payer: Self-pay

## 2020-09-05 ENCOUNTER — Ambulatory Visit (INDEPENDENT_AMBULATORY_CARE_PROVIDER_SITE_OTHER): Payer: 59 | Admitting: Internal Medicine

## 2020-09-05 ENCOUNTER — Encounter: Payer: Self-pay | Admitting: Internal Medicine

## 2020-09-05 ENCOUNTER — Other Ambulatory Visit (HOSPITAL_COMMUNITY): Payer: Self-pay

## 2020-09-05 ENCOUNTER — Other Ambulatory Visit: Payer: Self-pay

## 2020-09-05 DIAGNOSIS — U071 COVID-19: Secondary | ICD-10-CM

## 2020-09-05 MED ORDER — NIRMATRELVIR/RITONAVIR (PAXLOVID)TABLET
3.0000 | ORAL_TABLET | Freq: Two times a day (BID) | ORAL | 0 refills | Status: AC
  Filled 2020-09-05: qty 30, 5d supply, fill #0

## 2020-09-05 NOTE — Telephone Encounter (Signed)
Patient is concerned about taking the paxlovid. He is wanting to know is kidney and liver function stable enough to take the medication.  Please advise Shauntay Brunelli T Brooks Sailors

## 2020-09-05 NOTE — Progress Notes (Signed)
Virtual Visit via Telephone Note  I connected with Robert Ware on 09/05/20 at 11:30 AM EDT by telephone and verified that I am speaking with the correct person using two identifiers.  Location: Patient: Home Provider: [facility]   I discussed the limitations, risks, security and privacy concerns of performing an evaluation and management service by telephone and the availability of in person appointments. I also discussed with the patient that there may be a patient responsible charge related to this service. The patient expressed understanding and agreed to proceed.   History of Present Illness: Robert Ware yesterday to let us know that he had developed an acute respiratory illness 3 days ago and tested positive for COVID.  I called and spoke with him today.  He had some low-grade fever and began to develop cough and rhinorrhea.  Had any shortness of breath.  He has not had any change in his sense of taste or smell.  He believes that he may already be having some improvement.  He received his [place] booster vaccine last October.   Observations/Objective: HIV 1 RNA Quant  Date Value  11/10/2019 <20 Copies/mL (H)  10/25/2018 <20 NOT DETECTED copies/mL  10/13/2017 <20 NOT DETECTED copies/mL   CD4 T Cell Abs (/uL)  Date Value  11/10/2019 210 (L)  10/25/2018 224 (L)  10/13/2017 260 (L)     Assessment and Plan: He has developed relatively mild breakthrough COVID infection.  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  He is interested in treatment.  Will prescribe Paxlovid twice daily for 5 days.  Although the ritonavir component of Paxlovid will interact with his Genvoya and Prezista our ID pharmacist does not feel that that illness a clinically significant problem.  Follow Up Instructions: Paxlovid twice daily for 5 days Continue Genvoya and Prezista   I discussed the assessment and treatment plan with the patient. The patient was provided an opportunity to ask  questions and all were answered. The patient agreed with the plan and demonstrated an understanding of the instructions.   The patient was advised to call back or seek an in-person evaluation if the symptoms worsen or if the condition fails to improve as anticipated.  I provided 15 minutes of non-face-to-face time during this encounter.   Michel Bickers, MD

## 2020-09-06 NOTE — Telephone Encounter (Signed)
Pt informed and verbalized understanding

## 2020-09-16 ENCOUNTER — Encounter: Payer: Self-pay | Admitting: Internal Medicine

## 2020-11-07 ENCOUNTER — Other Ambulatory Visit: Payer: 59

## 2020-11-07 ENCOUNTER — Other Ambulatory Visit: Payer: Self-pay

## 2020-11-07 DIAGNOSIS — B20 Human immunodeficiency virus [HIV] disease: Secondary | ICD-10-CM

## 2020-11-08 LAB — T-HELPER CELL (CD4) - (RCID CLINIC ONLY)
CD4 % Helper T Cell: 34 % (ref 33–65)
CD4 T Cell Abs: 316 /uL — ABNORMAL LOW (ref 400–1790)

## 2020-11-09 LAB — CBC
HCT: 42.5 % (ref 38.5–50.0)
Hemoglobin: 14.5 g/dL (ref 13.2–17.1)
MCH: 28.2 pg (ref 27.0–33.0)
MCHC: 34.1 g/dL (ref 32.0–36.0)
MCV: 82.7 fL (ref 80.0–100.0)
Platelets: 26 10*3/uL — ABNORMAL LOW (ref 140–400)
RBC: 5.14 10*6/uL (ref 4.20–5.80)
RDW: 18.4 % — ABNORMAL HIGH (ref 11.0–15.0)
WBC: 2.4 10*3/uL — ABNORMAL LOW (ref 3.8–10.8)

## 2020-11-09 LAB — RPR: RPR Ser Ql: NONREACTIVE

## 2020-11-09 LAB — COMPREHENSIVE METABOLIC PANEL
AG Ratio: 1.1 (calc) (ref 1.0–2.5)
ALT: 71 U/L — ABNORMAL HIGH (ref 9–46)
AST: 93 U/L — ABNORMAL HIGH (ref 10–35)
Albumin: 3.7 g/dL (ref 3.6–5.1)
Alkaline phosphatase (APISO): 72 U/L (ref 35–144)
BUN: 9 mg/dL (ref 7–25)
CO2: 23 mmol/L (ref 20–32)
Calcium: 9 mg/dL (ref 8.6–10.3)
Chloride: 107 mmol/L (ref 98–110)
Creat: 0.91 mg/dL (ref 0.70–1.30)
Globulin: 3.4 g/dL (calc) (ref 1.9–3.7)
Glucose, Bld: 110 mg/dL — ABNORMAL HIGH (ref 65–99)
Potassium: 4.2 mmol/L (ref 3.5–5.3)
Sodium: 137 mmol/L (ref 135–146)
Total Bilirubin: 1.1 mg/dL (ref 0.2–1.2)
Total Protein: 7.1 g/dL (ref 6.1–8.1)

## 2020-11-09 LAB — LIPID PANEL
Cholesterol: 183 mg/dL (ref <200)
HDL: 61 mg/dL (ref >=40)
LDL Cholesterol (Calc): 101 mg/dL (calc) — ABNORMAL HIGH
Non-HDL Cholesterol (Calc): 122 mg/dL (calc) (ref <130)
Total CHOL/HDL Ratio: 3 (calc) (ref <5.0)
Triglycerides: 116 mg/dL (ref <150)

## 2020-11-09 LAB — HIV-1 RNA QUANT-NO REFLEX-BLD
HIV 1 RNA Quant: NOT DETECTED Copies/mL
HIV-1 RNA Quant, Log: NOT DETECTED Log cps/mL

## 2020-11-13 ENCOUNTER — Encounter: Payer: Self-pay | Admitting: Gastroenterology

## 2020-11-13 ENCOUNTER — Other Ambulatory Visit (HOSPITAL_COMMUNITY): Payer: Self-pay

## 2020-11-13 ENCOUNTER — Other Ambulatory Visit (INDEPENDENT_AMBULATORY_CARE_PROVIDER_SITE_OTHER): Payer: 59

## 2020-11-13 ENCOUNTER — Ambulatory Visit (INDEPENDENT_AMBULATORY_CARE_PROVIDER_SITE_OTHER): Payer: 59 | Admitting: Gastroenterology

## 2020-11-13 VITALS — BP 136/80 | HR 68 | Ht 71.25 in | Wt 243.1 lb

## 2020-11-13 DIAGNOSIS — K7460 Unspecified cirrhosis of liver: Secondary | ICD-10-CM

## 2020-11-13 LAB — COMPREHENSIVE METABOLIC PANEL
ALT: 73 U/L — ABNORMAL HIGH (ref 0–53)
AST: 93 U/L — ABNORMAL HIGH (ref 0–37)
Albumin: 4.1 g/dL (ref 3.5–5.2)
Alkaline Phosphatase: 65 U/L (ref 39–117)
BUN: 8 mg/dL (ref 6–23)
CO2: 27 mEq/L (ref 19–32)
Calcium: 9.2 mg/dL (ref 8.4–10.5)
Chloride: 104 mEq/L (ref 96–112)
Creatinine, Ser: 0.9 mg/dL (ref 0.40–1.50)
GFR: 95.55 mL/min (ref >60.00)
Glucose, Bld: 112 mg/dL — ABNORMAL HIGH (ref 70–99)
Potassium: 4.2 mEq/L (ref 3.5–5.1)
Sodium: 135 mEq/L (ref 135–145)
Total Bilirubin: 1.4 mg/dL — ABNORMAL HIGH (ref 0.2–1.2)
Total Protein: 7.8 g/dL (ref 6.0–8.3)

## 2020-11-13 LAB — CBC
HCT: 45.7 % (ref 39.0–52.0)
Hemoglobin: 15.2 g/dL (ref 13.0–17.0)
MCHC: 33.2 g/dL (ref 30.0–36.0)
MCV: 83.3 fl (ref 78.0–100.0)
Platelets: 25 10*3/uL — CL (ref 150.0–400.0)
RBC: 5.49 Mil/uL (ref 4.22–5.81)
RDW: 17.9 % — ABNORMAL HIGH (ref 11.5–15.5)
WBC: 2.6 10*3/uL — ABNORMAL LOW (ref 4.0–10.5)

## 2020-11-13 LAB — PROTIME-INR
INR: 1.4 ratio — ABNORMAL HIGH (ref 0.8–1.0)
Prothrombin Time: 14.6 s — ABNORMAL HIGH (ref 9.6–13.1)

## 2020-11-13 MED ORDER — PROPRANOLOL HCL 10 MG PO TABS
10.0000 mg | ORAL_TABLET | Freq: Three times a day (TID) | ORAL | 11 refills | Status: DC
  Filled 2020-11-13: qty 90, 30d supply, fill #0
  Filled 2020-12-20: qty 90, 30d supply, fill #1
  Filled 2021-01-27: qty 90, 30d supply, fill #2
  Filled 2021-02-28 – 2021-03-03 (×2): qty 90, 30d supply, fill #3
  Filled 2021-04-04: qty 90, 30d supply, fill #4

## 2020-11-13 NOTE — Progress Notes (Signed)
Review of pertinent gastrointestinal problems: 1.  Adenomatous colon polyps.  Colonoscopy February 2022 multiple polyps including larger, high risk adenomas.  Repeat colonoscopy at 3-year interval recommended. 2.  Cirrhosis (slightly elevated liver transaminases, chronically low platelets for many years, INR 1.3, unclear etiology, never alcohol drinker) Laboratory work-up January 2022 hepatitis B,C negative, + immune to Hep A.,  ANA negative, iron studies not indicative of iron overload, ceruloplasmin normal, anti-smooth muscle antibody negative antimitochondrial antibody negative, alpha-1 antitrypsin level normal, EGD February 2022 medium to large esophageal varices noted, also portal gastropathy.  Started on nadolol 20mg  daily.  H. pylori positive gastritis was proven and he was put on appropriate antibiotics. Ultrasound January 2022 cirrhosis without focal mass lesions.  Ultrasound July 2022 cirrhosis, no evidence of hepatocellular carcinoma. Hepatitis B immunization series needed Alpha-fetoprotein January 2022 3.9, normal MELD Na 06/2020 was 12 3. HIV under excellent control, sees infectious disease.   HPI: This is a very pleasant 55 year old man whom I last saw 3 to 4 months ago.  Here in the office  His weight is down 7 pounds since his last office visit here about 3 and half months ago.  Same scale.  At the time of his last visit I started him on Aldactone 100 mg once daily, Lasix 40 mg once daily.  Repeat metabolic profile about a month after starting showed no issues with his creatinine or electrolytes.  He tells me that someone told him to stop taking his diuretic medicines and also his propranolol.  I am not sure where that came from.  He has been completely off of diuretics for least 1 or 2 months now.  He is also not been on propranolol for about the same time.  He feels quite well.  No overt GI bleeding.  He has no further swelling in his ankles or legs.  No hematemesis or  hematochezia.  No episodes of encephalopathy.  ROS: complete GI ROS as described in HPI, all other review negative.  Constitutional:  No unintentional weight loss   Past Medical History:  Diagnosis Date   Cirrhosis ([place])    PT DENIES   Condyloma    ED (erectile dysfunction)    Elevated liver enzymes    GERD (gastroesophageal reflux disease)    HIV (human immunodeficiency virus infection) ([place])    Seborrhea    Splenomegaly    Thrombocytopenia ([place])    Warts     Past Surgical History:  Procedure Laterality Date   BIOPSY  03/21/2020   Procedure: BIOPSY;  Surgeon: [name], MD;  [facility];  Service: Endoscopy;;   COLONOSCOPY WITH PROPOFOL N/A 03/21/2020   Procedure: COLONOSCOPY WITH PROPOFOL;  Surgeon: [name], MD;  [facility];  Service: Endoscopy;  Laterality: N/A;   CONDYLOMA EXCISION/FULGURATION  2007   CONDYLOMA EXCISION/FULGURATION N/A 07/01/2018   Procedure: CONDYLOMA REMOVAL;  Surgeon: Franchot Gallo, MD;  Location: [facility];  Service: Urology;  Laterality: N/A;   ESOPHAGOGASTRODUODENOSCOPY (EGD) WITH PROPOFOL N/A 03/21/2020   Procedure: ESOPHAGOGASTRODUODENOSCOPY (EGD) WITH PROPOFOL;  Surgeon: [name], MD;  [facility];  Service: Endoscopy;  Laterality: N/A;   HEMOSTASIS CLIP PLACEMENT  03/21/2020   Procedure: HEMOSTASIS CLIP PLACEMENT;  Surgeon: [name], MD;  [facility];  Service: Endoscopy;;   POLYPECTOMY  03/21/2020   Procedure: POLYPECTOMY;  Surgeon: [name], MD;  [facility];  Service: Endoscopy;;    Current Outpatient Medications  Medication Sig Dispense Refill  darunavir (PREZISTA) 800 MG tablet TAKE 1 TABLET(800 MG) BY MOUTH DAILY WITH BREAKFAST (Patient taking differently: Take 800 mg by mouth daily with breakfast.) 30 tablet 11   elvitegravir-cobicistat-emtricitabine-tenofovir (GENVOYA) 150-150-200-10 MG TABS tablet Take 1 tablet by mouth daily  with breakfast. 30 tablet 11   No current facility-administered medications for this visit.    Allergies as of 11/13/2020   (No Known Allergies)    Family History  Problem Relation Age of Onset   Diabetes Mother    Hypertension Mother    Stroke Father    Colon polyps Neg Hx    Colon cancer Neg Hx    Esophageal cancer Neg Hx    Pancreatic cancer Neg Hx    Liver disease Neg Hx    Stomach cancer Neg Hx     Social History   Socioeconomic History   Marital status: Legally Separated    Spouse name: Not on file   Number of children: Not on file   Years of education: Not on file   Highest education level: Not on file  Occupational History   Not on file  Tobacco Use   Smoking status: Never   Smokeless tobacco: Never  Vaping Use   Vaping Use: Never used  Substance and Sexual Activity   Alcohol use: Yes    Comment: occasional   Drug use: No   Sexual activity: Not Currently    Comment: offered condoms  Other Topics Concern   Not on file  Social History Narrative   Not on file   Social Determinants of Health   Financial Resource Strain: Not on file  Food Insecurity: Not on file  Transportation Needs: Not on file  Physical Activity: Not on file  Stress: Not on file  Social Connections: Not on file  Intimate Partner Violence: Not on file     Physical Exam: BP 136/80 (BP Location: Left Arm, Patient Position: Sitting, Cuff Size: Normal)   Pulse 68   Ht 5' 11.25" (1.81 m) Comment: height measured without shoes  Wt 243 lb 2 oz (110.3 kg)   BMI 33.67 kg/m  Constitutional: generally well-appearing Psychiatric: alert and oriented x3 Abdomen: soft, nontender, nondistended, no obvious ascites, no peritoneal signs, normal bowel sounds No peripheral edema noted in lower extremities  Assessment and plan: 55 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  Fortunately  it does not appear that he needs to be on a diuretic regimen anymore since he has no lower extremity edema and clearly no ascites on examination.  He should however restart his propranolol and I am giving him a new prescription for that, 10 mg pills 1 pill 3 times daily.  He needs to be immunized for hepatitis B and we will take care of that today as well.  We need to confirm H. pylori eradication and I will do that with H. pylori stool antigen today.  Lastly we will get basic set of labs to recalculate meld score including a CBC, complete metabolic profile and coags today.  He will return to see me in 6 months and sooner if needed.  Please see the "Patient Instructions" section for addition details about the plan.  Owens Loffler, MD [facility] 11/13/2020, 10:56 AM   Total time on date of encounter was 35 minutes (this included time spent preparing to see the patient reviewing records; obtaining and/or reviewing separately obtained history; performing a medically appropriate exam  and/or evaluation; counseling and educating the patient and family if present; ordering medications, tests or procedures if applicable; and documenting clinical information in the health record).

## 2020-11-13 NOTE — Patient Instructions (Addendum)
If you are age 56 or younger, your body mass index should be between 19-25. Your Body mass index is 33.67 kg/m. If this is out of the aformentioned range listed, please consider follow up with your Primary Care Provider.  ________________________________________________________  The [facility] providers would like to encourage you to use [facility] to communicate with providers for non-urgent requests or questions.  Due to long hold times on the telephone, sending your provider a message by [facility] may be a faster and more efficient way to get a response.  Please allow 48 business hours for a response.  Please remember that this is for non-urgent requests.  _______________________________________________________  Your provider has requested that you go to the basement level for lab work before leaving today. Press "B" on the elevator. The lab is located at the first door on the left as you exit the elevator.  We have sent the following medications to your pharmacy for you to pick up at your convenience:  START: propranolol 10mg  one tablet three times daily  You will be seen in follow up in 6 months (April 2023).  We will contact you to schedule this appointment.  Heplisav-B immunization given today.  2nd Heplisav-B immunization will be given on 12-13-20 at 9am.  Thank you for entrusting me with your care and choosing [facility].  Dr Ardis Hughs

## 2020-11-15 ENCOUNTER — Other Ambulatory Visit (HOSPITAL_COMMUNITY): Payer: Self-pay

## 2020-11-15 ENCOUNTER — Other Ambulatory Visit: Payer: 59

## 2020-11-15 DIAGNOSIS — K7460 Unspecified cirrhosis of liver: Secondary | ICD-10-CM

## 2020-11-17 LAB — H. PYLORI ANTIGEN, STOOL: H pylori Ag, Stl: NEGATIVE

## 2020-11-20 DIAGNOSIS — A048 Other specified bacterial intestinal infections: Secondary | ICD-10-CM | POA: Insufficient documentation

## 2020-11-21 ENCOUNTER — Ambulatory Visit (INDEPENDENT_AMBULATORY_CARE_PROVIDER_SITE_OTHER): Payer: 59

## 2020-11-21 ENCOUNTER — Other Ambulatory Visit: Payer: Self-pay

## 2020-11-21 ENCOUNTER — Other Ambulatory Visit (HOSPITAL_COMMUNITY): Payer: Self-pay

## 2020-11-21 ENCOUNTER — Encounter: Payer: Self-pay | Admitting: Internal Medicine

## 2020-11-21 ENCOUNTER — Ambulatory Visit: Payer: 59 | Admitting: Internal Medicine

## 2020-11-21 VITALS — BP 133/89 | HR 56 | Temp 98.0°F | Resp 16 | Ht 72.0 in | Wt 247.6 lb

## 2020-11-21 DIAGNOSIS — D696 Thrombocytopenia, unspecified: Secondary | ICD-10-CM

## 2020-11-21 DIAGNOSIS — B20 Human immunodeficiency virus [HIV] disease: Secondary | ICD-10-CM

## 2020-11-21 DIAGNOSIS — Z23 Encounter for immunization: Secondary | ICD-10-CM

## 2020-11-21 DIAGNOSIS — A048 Other specified bacterial intestinal infections: Secondary | ICD-10-CM

## 2020-11-21 MED ORDER — GENVOYA 150-150-200-10 MG PO TABS
1.0000 | ORAL_TABLET | Freq: Every day | ORAL | 11 refills | Status: DC
  Filled 2020-11-21 (×2): qty 30, 30d supply, fill #0

## 2020-11-21 MED ORDER — DARUNAVIR 800 MG PO TABS
800.0000 mg | ORAL_TABLET | Freq: Every day | ORAL | 11 refills | Status: DC
Start: 2020-11-21 — End: 2020-11-26
  Filled 2020-11-21: qty 30, 30d supply, fill #0

## 2020-11-21 NOTE — Progress Notes (Addendum)
   Covid-19 Vaccination Clinic  Name:  Trygg Mantz    MRN: [id number] DOB: February 23, 1965  12/04/2020  Mr. Pieratt was observed post Covid-19 immunization for 15 minutes without incident. He was provided with Vaccine Information Sheet and instruction to access the V-Safe system.   Mr. Mehra was instructed to call 911 with any severe reactions post vaccine: Difficulty breathing  Swelling of face and throat  A fast heartbeat  A bad rash all over body  Dizziness and weakness   Immunizations Administered     Name Date Dose VIS Date Route   Pfizer Covid-19 Vaccine Bivalent Booster 11/21/2020  9:29 AM 0.3 mL 09/25/2020 Intramuscular   Manufacturer: [place]   Lot: [id number]   NDC: Sarah Ann [place], [place]

## 2020-11-21 NOTE — Progress Notes (Signed)
Patient Active Problem List   Diagnosis Date Noted   COVID 09/05/2020    Priority: 1.   Human immunodeficiency virus (HIV) disease ([place]) 02/06/2006    Priority: 1.   Cirrhosis ([place]) 02/25/2017    Priority: 2.   Splenomegaly 02/25/2017    Priority: 2.   Elevated liver enzymes 06/14/2014    Priority: 2.   Thrombocytopenia ([place]) 02/06/2006    Priority: 2.   H. pylori infection 11/20/2020   SEBORRHEA 04/17/2008   Genital warts 02/06/2006   ERECTILE DYSFUNCTION 02/06/2006   GERD 02/06/2006    Patient's Medications  New Prescriptions   DARUNAVIR (PREZISTA) 800 MG TABLET    Take 1 tablet (800 mg total) by mouth daily with breakfast.  Previous Medications   DARUNAVIR (PREZISTA) 800 MG TABLET    TAKE 1 TABLET(800 MG) BY MOUTH DAILY WITH BREAKFAST   PROPRANOLOL (INDERAL) 10 MG TABLET    Take 1 tablet  by mouth 3 times daily.  Modified Medications   Modified Medication Previous Medication   ELVITEGRAVIR-COBICISTAT-EMTRICITABINE-TENOFOVIR (GENVOYA) 150-150-200-10 MG TABS TABLET elvitegravir-cobicistat-emtricitabine-tenofovir (GENVOYA) 150-150-200-10 MG TABS tablet      Take 1 tablet by mouth daily with breakfast.    Take 1 tablet by mouth daily with breakfast.  Discontinued Medications   No medications on file    Subjective: Robert Ware is in for his routine HIV follow-up visit.  He denies any problems obtaining, taking or tolerating his Genvoya and Prezista and, as usual, he never misses doses.  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  He is feeling well other than some abdominal fullness.  He has not had his annual influenza vaccine or a COVID booster.  He is not feeling anxious or depressed.  Earlier this year he was found to have H. pylori gastritis.  He was treated with and cured.  He is not having any heartburn  Review of Systems: Review of Systems  Constitutional:  Negative for chills, diaphoresis, fever, malaise/fatigue and weight loss.  HENT:   Negative for sore throat.   Respiratory:  Negative for cough, sputum production and shortness of breath.   Cardiovascular:  Negative for chest pain.  Gastrointestinal:  Negative for abdominal pain, diarrhea, heartburn, nausea and vomiting.       Fullness as noted in HPI.  Genitourinary:  Negative for dysuria and frequency.  Musculoskeletal:  Negative for joint pain and myalgias.  Skin:  Negative for rash.  Neurological:  Negative for dizziness and headaches.  Psychiatric/Behavioral:  Negative for depression and substance abuse. The patient is not nervous/anxious.    Past Medical History:  Diagnosis Date   Cirrhosis ([place])    PT DENIES   Condyloma    ED (erectile dysfunction)    Elevated liver enzymes    GERD (gastroesophageal reflux disease)    HIV (human immunodeficiency virus infection) ([place])    Seborrhea    Splenomegaly    Thrombocytopenia ([facility])    Warts     Social History   Tobacco Use   Smoking status: Never   Smokeless tobacco: Never  Vaping Use   Vaping Use: Never used  Substance Use Topics   Alcohol use: Yes    Comment: occasional   Drug use: No    Family History  Problem Relation Age of Onset   Diabetes Mother    Hypertension Mother    Stroke Father    Colon polyps Neg Hx    Colon cancer Neg Hx  Esophageal cancer Neg Hx    Pancreatic cancer Neg Hx    Liver disease Neg Hx    Stomach cancer Neg Hx     No Known Allergies  Health Maintenance  Topic Date Due   COVID-19 Vaccine (1) Never done   TETANUS/TDAP  Never done   Zoster Vaccines- Shingrix (1 of 2) Never done   INFLUENZA VACCINE  08/26/2020   Pneumococcal Vaccine 70-22 Years old (4 - PPSV23 if available, else PCV20) 06/18/2029   COLONOSCOPY (Pts 45-11yrs Insurance coverage will need to be confirmed)  03/21/2030   Hepatitis C Screening  Completed   HIV Screening  Completed   HPV VACCINES  Aged Out    Objective:  Vitals:   11/21/20 0848  BP: 133/89  Pulse: (!) 56  Resp: 16  Temp: 98  F (36.7 C)  TempSrc: Temporal  SpO2: 97%  Weight: 247 lb 9.6 oz (112.3 kg)  Height: 6' (1.829 m)   Body mass index is 33.58 kg/m.  Physical Exam Constitutional:      Comments: His spirits are good as usual.  Cardiovascular:     Rate and Rhythm: Normal rate and regular rhythm.     Heart sounds: No murmur heard. Pulmonary:     Effort: Pulmonary effort is normal.     Breath sounds: Normal breath sounds.  Abdominal:     General: There is distension.     Palpations: Abdomen is soft.     Tenderness: There is no abdominal tenderness.     Comments: No fluid wave.  Skin:    Findings: No rash.  Psychiatric:        Mood and Affect: Mood normal.    Lab Results Lab Results  Component Value Date   WBC 2.6 (L) 11/13/2020   HGB 15.2 11/13/2020   HCT 45.7 11/13/2020   MCV 83.3 11/13/2020   PLT 25.0 Repeated and verified X2. (LL) 11/13/2020    Lab Results  Component Value Date   CREATININE 0.90 11/13/2020   BUN 8 11/13/2020   NA 135 11/13/2020   K 4.2 11/13/2020   CL 104 11/13/2020   CO2 27 11/13/2020    Lab Results  Component Value Date   ALT 73 (H) 11/13/2020   AST 93 (H) 11/13/2020   ALKPHOS 65 11/13/2020   BILITOT 1.4 (H) 11/13/2020    Lab Results  Component Value Date   CHOL 183 11/07/2020   HDL 61 11/07/2020   LDLCALC 101 (H) 11/07/2020   TRIG 116 11/07/2020   CHOLHDL 3.0 11/07/2020   Lab Results  Component Value Date   LABRPR NON-REACTIVE 11/07/2020   HIV 1 RNA Quant  Date Value  11/07/2020 Not Detected Copies/mL  11/10/2019 <20 Copies/mL (H)  10/25/2018 <20 NOT DETECTED copies/mL   CD4 T Cell Abs (/uL)  Date Value  11/07/2020 316 (L)  11/10/2019 210 (L)  10/25/2018 224 (L)     Problem List Items Addressed This Visit       1.   Human immunodeficiency virus (HIV) disease ([place])    His infection remains under excellent, long-term control.  He will continue Genvoya and Prezista and follow-up after lab work in 1 year.  He received his influenza  vaccine and a COVID booster vaccine today.      Relevant Medications   elvitegravir-cobicistat-emtricitabine-tenofovir (GENVOYA) 150-150-200-10 MG TABS tablet   darunavir (PREZISTA) 800 MG tablet   Other Relevant Orders   CBC   T-helper cell (CD4)- ([facility] clinic only)   Comprehensive metabolic  panel   Lipid panel   RPR   HIV-1 RNA quant-no reflex-bld     2.   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        Unprioritized   H. pylori infection    His H. pylori gastritis has been cured.      Relevant Medications   elvitegravir-cobicistat-emtricitabine-tenofovir (GENVOYA) 150-150-200-10 MG TABS tablet   darunavir (PREZISTA) 800 MG tablet      Michel Bickers, MD [facility] for Infectious [place] [phone] pager   [phone] cell 11/21/2020, 9:13 AM

## 2020-11-21 NOTE — Assessment & Plan Note (Signed)
His infection remains under excellent, long-term control.  He will continue Genvoya and Prezista and follow-up after lab work in 1 year.  He received his influenza vaccine and a COVID booster vaccine today.

## 2020-11-21 NOTE — Assessment & Plan Note (Signed)
His H. pylori gastritis has been cured.

## 2020-11-21 NOTE — Assessment & Plan Note (Signed)
He has chronic, asymptomatic thrombocytopenia related to his cirrhosis.

## 2020-11-25 ENCOUNTER — Other Ambulatory Visit (HOSPITAL_COMMUNITY): Payer: Self-pay

## 2020-11-25 ENCOUNTER — Telehealth: Payer: Self-pay

## 2020-11-25 NOTE — Telephone Encounter (Signed)
[facility] Patient Advocate Encounter  Prior Authorization for Robert Ware has been approved.    PA# [id number] Effective dates: 11/22/20 through 11/21/21  Patients co-pay is $3548.56.   [facility] will continue to follow.  Ileene Patrick, [facility] Patient [facility] for Infectious Disease Phone: [phone] Fax:  [phone]

## 2020-11-26 ENCOUNTER — Other Ambulatory Visit: Payer: Self-pay | Admitting: Pharmacist

## 2020-11-26 DIAGNOSIS — B20 Human immunodeficiency virus [HIV] disease: Secondary | ICD-10-CM

## 2020-11-26 MED ORDER — GENVOYA 150-150-200-10 MG PO TABS: 1.0000 | ORAL_TABLET | Freq: Every day | ORAL | 11 refills | Status: DC

## 2020-11-26 MED ORDER — DARUNAVIR 800 MG PO TABS: 800.0000 mg | ORAL_TABLET | Freq: Every day | ORAL | 11 refills | Status: DC

## 2020-12-13 ENCOUNTER — Ambulatory Visit (INDEPENDENT_AMBULATORY_CARE_PROVIDER_SITE_OTHER): Payer: 59 | Admitting: Gastroenterology

## 2020-12-13 DIAGNOSIS — Z23 Encounter for immunization: Secondary | ICD-10-CM

## 2020-12-14 NOTE — Progress Notes (Signed)
, °

## 2020-12-20 ENCOUNTER — Other Ambulatory Visit (HOSPITAL_COMMUNITY): Payer: Self-pay

## 2021-01-27 ENCOUNTER — Other Ambulatory Visit (HOSPITAL_COMMUNITY): Payer: Self-pay

## 2021-01-28 ENCOUNTER — Other Ambulatory Visit (HOSPITAL_COMMUNITY): Payer: Self-pay

## 2021-01-29 ENCOUNTER — Other Ambulatory Visit: Payer: Self-pay

## 2021-01-29 ENCOUNTER — Ambulatory Visit: Payer: Self-pay

## 2021-01-30 ENCOUNTER — Other Ambulatory Visit (HOSPITAL_COMMUNITY): Payer: Self-pay

## 2021-02-28 ENCOUNTER — Other Ambulatory Visit (HOSPITAL_COMMUNITY): Payer: Self-pay

## 2021-03-03 ENCOUNTER — Other Ambulatory Visit (HOSPITAL_COMMUNITY): Payer: Self-pay

## 2021-03-26 ENCOUNTER — Other Ambulatory Visit: Payer: Self-pay

## 2021-03-26 ENCOUNTER — Ambulatory Visit (INDEPENDENT_AMBULATORY_CARE_PROVIDER_SITE_OTHER): Payer: Self-pay | Admitting: Gastroenterology

## 2021-03-26 ENCOUNTER — Other Ambulatory Visit (INDEPENDENT_AMBULATORY_CARE_PROVIDER_SITE_OTHER): Payer: Self-pay

## 2021-03-26 ENCOUNTER — Emergency Department (HOSPITAL_COMMUNITY)
Admission: EM | Admit: 2021-03-26 | Discharge: 2021-03-27 | Disposition: A | Discharge status: Home / Self Care | Payer: PRIVATE HEALTH INSURANCE | Attending: Emergency Medicine | Admitting: Emergency Medicine

## 2021-03-26 ENCOUNTER — Encounter: Payer: Self-pay | Admitting: Gastroenterology

## 2021-03-26 VITALS — BP 90/64 | HR 64 | Ht 72.0 in | Wt 223.0 lb

## 2021-03-26 DIAGNOSIS — E1169 Type 2 diabetes mellitus with other specified complication: Secondary | ICD-10-CM

## 2021-03-26 DIAGNOSIS — Z7984 Long term (current) use of oral hypoglycemic drugs: Secondary | ICD-10-CM | POA: Diagnosis not present

## 2021-03-26 DIAGNOSIS — K7460 Unspecified cirrhosis of liver: Secondary | ICD-10-CM

## 2021-03-26 DIAGNOSIS — R739 Hyperglycemia, unspecified: Secondary | ICD-10-CM | POA: Diagnosis present

## 2021-03-26 DIAGNOSIS — E1165 Type 2 diabetes mellitus with hyperglycemia: Secondary | ICD-10-CM | POA: Insufficient documentation

## 2021-03-26 DIAGNOSIS — R350 Frequency of micturition: Secondary | ICD-10-CM

## 2021-03-26 LAB — BASIC METABOLIC PANEL
Anion gap: 10 (ref 5–15)
BUN: 14 mg/dL (ref 6–20)
CO2: 23 mmol/L (ref 22–32)
Calcium: 9.3 mg/dL (ref 8.9–10.3)
Chloride: 100 mmol/L (ref 98–111)
Creatinine, Ser: 1.02 mg/dL (ref 0.61–1.24)
GFR, Estimated: >60 mL/min (ref >60)
Glucose, Bld: 392 mg/dL — ABNORMAL HIGH (ref 70–99)
Potassium: 4.7 mmol/L (ref 3.5–5.1)
Sodium: 133 mmol/L — ABNORMAL LOW (ref 135–145)

## 2021-03-26 LAB — URINALYSIS, ROUTINE W REFLEX MICROSCOPIC
Bacteria, UA: NONE SEEN
Bilirubin Urine: NEGATIVE
Glucose, UA: >=500 mg/dL — AB
Ketones, ur: NEGATIVE mg/dL
Leukocytes,Ua: NEGATIVE
Nitrite: NEGATIVE
Protein, ur: NEGATIVE mg/dL
Specific Gravity, Urine: 1.027 (ref 1.005–1.030)
pH: 6 (ref 5.0–8.0)

## 2021-03-26 LAB — COMPREHENSIVE METABOLIC PANEL
ALT: 80 U/L — ABNORMAL HIGH (ref 0–53)
AST: 77 U/L — ABNORMAL HIGH (ref 0–37)
Albumin: 3.9 g/dL (ref 3.5–5.2)
Alkaline Phosphatase: 71 U/L (ref 39–117)
BUN: 15 mg/dL (ref 6–23)
CO2: 29 mEq/L (ref 19–32)
Calcium: 9.7 mg/dL (ref 8.4–10.5)
Chloride: 96 mEq/L (ref 96–112)
Creatinine, Ser: 1.12 mg/dL (ref 0.40–1.50)
GFR: 73.3 mL/min (ref >60.00)
Glucose, Bld: 453 mg/dL — ABNORMAL HIGH (ref 70–99)
Potassium: 4.5 mEq/L (ref 3.5–5.1)
Sodium: 132 mEq/L — ABNORMAL LOW (ref 135–145)
Total Bilirubin: 1.3 mg/dL — ABNORMAL HIGH (ref 0.2–1.2)
Total Protein: 8 g/dL (ref 6.0–8.3)

## 2021-03-26 LAB — CBC WITH DIFFERENTIAL/PLATELET
Abs Immature Granulocytes: 0.01 10*3/uL (ref 0.00–0.07)
Basophils Absolute: 0 10*3/uL (ref 0.0–0.1)
Basophils Relative: 1 %
Eosinophils Absolute: 0 10*3/uL (ref 0.0–0.5)
Eosinophils Relative: 1 %
HCT: 45.5 % (ref 39.0–52.0)
Hemoglobin: 16.1 g/dL (ref 13.0–17.0)
Immature Granulocytes: 0 %
Lymphocytes Relative: 36 %
Lymphs Abs: 1.1 10*3/uL (ref 0.7–4.0)
MCH: 29.1 pg (ref 26.0–34.0)
MCHC: 35.4 g/dL (ref 30.0–36.0)
MCV: 82.3 fL (ref 80.0–100.0)
Monocytes Absolute: 0.2 10*3/uL (ref 0.1–1.0)
Monocytes Relative: 8 %
Neutro Abs: 1.6 10*3/uL — ABNORMAL LOW (ref 1.7–7.7)
Neutrophils Relative %: 54 %
Platelets: 30 10*3/uL — ABNORMAL LOW (ref 150–400)
RBC: 5.53 MIL/uL (ref 4.22–5.81)
RDW: 14.6 % (ref 11.5–15.5)
WBC: 2.9 10*3/uL — ABNORMAL LOW (ref 4.0–10.5)
nRBC: 0 % (ref 0.0–0.2)

## 2021-03-26 LAB — CBC
HCT: 48 % (ref 39.0–52.0)
Hemoglobin: 16.2 g/dL (ref 13.0–17.0)
MCHC: 33.7 g/dL (ref 30.0–36.0)
MCV: 85.8 fl (ref 78.0–100.0)
Platelets: 21 10*3/uL — CL (ref 150.0–400.0)
RBC: 5.59 Mil/uL (ref 4.22–5.81)
RDW: 15.9 % — ABNORMAL HIGH (ref 11.5–15.5)
WBC: 2.5 10*3/uL — ABNORMAL LOW (ref 4.0–10.5)

## 2021-03-26 LAB — PROTIME-INR
INR: 1.2 ratio — ABNORMAL HIGH (ref 0.8–1.0)
Prothrombin Time: 13.2 s — ABNORMAL HIGH (ref 9.6–13.1)

## 2021-03-26 LAB — CBG MONITORING, ED: Glucose-Capillary: 364 mg/dL — ABNORMAL HIGH (ref 70–99)

## 2021-03-26 NOTE — ED Provider Triage Note (Signed)
Emergency Medicine Provider Triage Evaluation Note ? ?Robert Ware , a 56 y.o. male  was evaluated in triage.  Pt complains of excessive urination for the last week.  Patient states he went to his PCP earlier today and was noted to have high blood sugar, in the 400s.  Patient denies any history of diabetes.  Patient denies all symptoms.  Patient is complaining of excessive urination. ? ?Review of Systems  ?Positive: Polyuria ?Negative: Polydipsia, polyphagia, nausea, vomiting, diarrhea, fevers, abdominal pain ? ?Physical Exam  ?BP (!) 149/88 (BP Location: Left Arm)   Pulse 69   Temp 98.3 ?F (36.8 ?C) (Oral)   Resp 16   SpO2 98%  ?Gen:   Awake, no distress   ?Resp:  Normal effort  ?MSK:   Moves extremities without difficulty  ?Other:  Alert and orient x4 ? ?Medical Decision Making  ?Medically screening exam initiated at 9:01 PM.  Appropriate orders placed.  Nathanyl Drennen was informed that the remainder of the evaluation will be completed by another provider, this initial triage assessment does not replace that evaluation, and the importance of remaining in the ED until their evaluation is complete. ? ? ?  ?Azucena Cecil, PA-C ?03/26/21 2102 ? ?

## 2021-03-26 NOTE — ED Triage Notes (Signed)
Pt sent here by PCP for hyperglycemia. Pt states he has had increased urination x1 month, at PCP his blood sugar was high and was told to go here for evaluation.  ?

## 2021-03-26 NOTE — Progress Notes (Signed)
Review of pertinent gastrointestinal problems: ?1.  Adenomatous colon polyps.  Colonoscopy February 2022 multiple polyps including larger, high risk adenomas.  Repeat colonoscopy at 3-year interval recommended. ?2.  Cirrhosis (slightly elevated liver transaminases, chronically low platelets for many years, INR 1.3, unclear etiology, never alcohol drinker) ?NASH ?Laboratory work-up January 2022 hepatitis B,C negative, + immune to Hep A.,  ANA negative, iron studies not indicative of iron overload, ceruloplasmin normal, anti-smooth muscle antibody negative antimitochondrial antibody negative, alpha-1 antitrypsin level normal, ?EGD February 2022 medium to large esophageal varices noted, also portal gastropathy.  Started on nadolol 20mg  daily.  H. pylori positive gastritis was proven and he was put on appropriate antibiotics.  H. pylori stool antigen October 2022 was negative ?Ultrasound January 2022 cirrhosis without focal mass lesions.  Ultrasound July 2022 cirrhosis, no evidence of hepatocellular carcinoma. ?Hepatitis B immunization series.  Started October 2022 ?Alpha-fetoprotein January 2022 3.9, normal ?MELD Na 10/2020 was 11 ?3. HIV under excellent control, sees infectious disease. ? ? ?HPI: ?This is a very pleasant 56 year old man ? ? ?I last saw him about 5 months ago, October 2022.  At that time it was clear there was some miscommunication about his nonselective beta-blocker which I started to decrease the chance of bleeding from his large esophageal varices. ? ?His weight is down 18 pounds since his last office visit here 5 months ago. ? ?He tells me for the past month or so he has been urinating excessively and drinking a lot of fluids.  He is not eating too much.  This has been going on for 4 weeks.  He is not having any dysuria just excessive urinating.  He has lost weight.  He has no abdominal pains, no fevers or chills.   ? ? ?ROS: complete GI ROS as described in HPI, all other review  negative. ? ?Constitutional:  No unintentional weight loss ? ? ?Past Medical History:  ?Diagnosis Date  ? Cirrhosis ([place])   ? PT DENIES  ? Condyloma   ? ED (erectile dysfunction)   ? Elevated liver enzymes   ? GERD (gastroesophageal reflux disease)   ? HIV (human immunodeficiency virus infection) ([place])   ? Seborrhea   ? Splenomegaly   ? Thrombocytopenia ([place])   ? Warts   ? ? ?Past Surgical History:  ?Procedure Laterality Date  ? BIOPSY  03/21/2020  ? Procedure: BIOPSY;  Surgeon: [name], MD;  Location: [facility];  Service: Endoscopy;;  ? COLONOSCOPY WITH PROPOFOL N/A 03/21/2020  ? Procedure: COLONOSCOPY WITH PROPOFOL;  Surgeon: [name], MD;  Location: [facility];  Service: Endoscopy;  Laterality: N/A;  ? CONDYLOMA EXCISION/FULGURATION  2007  ? CONDYLOMA EXCISION/FULGURATION N/A 07/01/2018  ? Procedure: CONDYLOMA REMOVAL;  Surgeon: Franchot Gallo, MD;  Location: [facility];  Service: Urology;  Laterality: N/A;  ? ESOPHAGOGASTRODUODENOSCOPY (EGD) WITH PROPOFOL N/A 03/21/2020  ? Procedure: ESOPHAGOGASTRODUODENOSCOPY (EGD) WITH PROPOFOL;  Surgeon: [name], MD;  Location: [facility];  Service: Endoscopy;  Laterality: N/A;  ? HEMOSTASIS CLIP PLACEMENT  03/21/2020  ? Procedure: HEMOSTASIS CLIP PLACEMENT;  Surgeon: [name], MD;  Location: [facility];  Service: Endoscopy;;  ? POLYPECTOMY  03/21/2020  ? Procedure: POLYPECTOMY;  Surgeon: [name], MD;  Location: [facility];  Service: Endoscopy;;  ? ? ?Current Outpatient Medications  ?Medication Instructions  ? darunavir (PREZISTA) 800 mg, Oral, Daily with breakfast  ? elvitegravir-cobicistat-emtricitabine-tenofovir (GENVOYA) 150-150-200-10 MG TABS tablet 1 tablet, Oral, Daily with breakfast  ? propranolol (INDERAL) 10  MG tablet Take 1 tablet  by mouth 3 times daily.  ? ? ?Allergies as of 03/26/2021  ? (No Known Allergies)  ? ? ?Family History  ?Problem Relation Age of Onset  ? Diabetes Mother   ? Hypertension  Mother   ? Stroke Father   ? Colon polyps Neg Hx   ? Colon cancer Neg Hx   ? Esophageal cancer Neg Hx   ? Pancreatic cancer Neg Hx   ? Liver disease Neg Hx   ? Stomach cancer Neg Hx   ? ? ?Social History  ? ?Socioeconomic History  ? Marital status: Legally Separated  ?  Spouse name: Not on file  ? Number of children: Not on file  ? Years of education: Not on file  ? Highest education level: Not on file  ?Occupational History  ? Not on file  ?Tobacco Use  ? Smoking status: Never  ? Smokeless tobacco: Never  ?Vaping Use  ? Vaping Use: Never used  ?Substance and Sexual Activity  ? Alcohol use: Yes  ?  Comment: occasional  ? Drug use: No  ? Sexual activity: Not Currently  ?  Comment: pt given condoms  ?Other Topics Concern  ? Not on file  ?Social History Narrative  ? Not on file  ? ?Social Determinants of Health  ? ?Financial Resource Strain: Not on file  ?Food Insecurity: Not on file  ?Transportation Needs: Not on file  ?Physical Activity: Not on file  ?Stress: Not on file  ?Social Connections: Not on file  ?Intimate Partner Violence: Not on file  ? ? ? ?Physical Exam: ?BP 90/64   Pulse 64   Ht 6' (1.829 m)   Wt 223 lb (101.2 kg)   BMI 30.24 kg/m?  ?Constitutional: generally well-appearing ?Psychiatric: alert and oriented x3 ?Abdomen: soft, nontender, nondistended, no obvious ascites, no peritoneal signs, normal bowel sounds ?No peripheral edema noted in lower extremities ? ?Assessment and plan: ?56 y.o. male with cirrhosis, likely Karlene Lineman related ? ?He has had no symptoms of chronic liver disease.  He does need hepatoma screening and restaging of his liver disease with calculation of MELD score.  He will get CBC, complete metabolic profile, coags, alpha-fetoprotein and ultrasound of his liver. ? ?His biggest issue is that he is having significant polyuria and polydipsia.  One of his medicines, Prezista does list diabetes as a potential complication.  I am going to get a urinalysis with urine culture as well as  hemoglobin A1c.  If these do not help determine the cause of his polyuria and polydipsia I will probably ask for expedited evaluation by an internist for this problem ? ?Please see the "Patient Instructions" section for addition details about the plan. ? ?Owens Loffler, MD ?[facility] ?03/26/2021, 10:54 AM ? ? ?Total time on date of encounter was 45 minutes (this included time spent preparing to see the patient reviewing records; obtaining and/or reviewing separately obtained history; performing a medically appropriate exam and/or evaluation; counseling and educating the patient and family if present; ordering medications, tests or procedures if applicable; and documenting clinical information in the health record). ? ?

## 2021-03-26 NOTE — Patient Instructions (Signed)
If you are age 57 or younger, your body mass index should be between 19-25. Your Body mass index is 30.24 kg/m?Marland Kitchen If this is out of the aformentioned range listed, please consider follow up with your Primary Care Provider.  ?________________________________________________________ ? ?The [place] providers would like to encourage you to use [facility] to communicate with providers for non-urgent requests or questions.  Due to long hold times on the telephone, sending your provider a message by [facility] may be a faster and more efficient way to get a response.  Please allow 48 business hours for a response.  Please remember that this is for non-urgent requests.  ?_______________________________________________________ ? ?Your provider has requested that you go to the basement level for lab work before leaving today. Press "B" on the elevator. The lab is located at the first door on the left as you exit the elevator. ? ?You have been scheduled for an abdominal ultrasound at [facility] Rad[facility] (1st floor of hospital) on 04-01-21 at 8:30am. Please arrive 30 minutes prior to your appointment for registration. Make certain not to have anything to eat or drink after midnight prior to your appointment. Should you need to reschedule your appointment, please contact radiology at [phone]. This test typically takes about 30 minutes to perform. ? ?Due to recent changes in healthcare laws, you may see the results of your imaging and laboratory studies on MyChart before your provider has had a chance to review them.  We understand that in some cases there may be results that are confusing or concerning to you. Not all laboratory results come back in the same time frame and the provider may be waiting for multiple results in order to interpret others.  Please give [place] 48 hours in order for your provider to thoroughly review all the results before contacting the office for clarification of your results.  ? ?Thank you for  entrusting me with your care and choosing [facility]. ? ?Dr Ardis Hughs ? ?

## 2021-03-27 ENCOUNTER — Other Ambulatory Visit (HOSPITAL_COMMUNITY): Payer: Self-pay

## 2021-03-27 LAB — URINALYSIS, ROUTINE W REFLEX MICROSCOPIC
Bilirubin Urine: NEGATIVE
Hgb urine dipstick: NEGATIVE
Ketones, ur: NEGATIVE
Leukocytes,Ua: NEGATIVE
Nitrite: NEGATIVE
RBC / HPF: NONE SEEN (ref 0–?)
Specific Gravity, Urine: <=1.005 — AB (ref 1.000–1.030)
Total Protein, Urine: NEGATIVE
Urine Glucose: >=1000 — AB
Urobilinogen, UA: 1 (ref 0.0–1.0)
WBC, UA: NONE SEEN (ref 0–?)
pH: 6.5 (ref 5.0–8.0)

## 2021-03-27 LAB — HEMOGLOBIN A1C
Hgb A1c MFr Bld: 13.2 % of total Hgb — ABNORMAL HIGH (ref <5.7)
Mean Plasma Glucose: 332 mg/dL
eAG (mmol/L): 18.4 mmol/L

## 2021-03-27 LAB — AFP TUMOR MARKER: AFP-Tumor Marker: 3.7 ng/mL (ref <6.1)

## 2021-03-27 LAB — CBG MONITORING, ED: Glucose-Capillary: 268 mg/dL — ABNORMAL HIGH (ref 70–99)

## 2021-03-27 MED ORDER — METFORMIN HCL 500 MG PO TABS
500.0000 mg | ORAL_TABLET | Freq: Two times a day (BID) | ORAL | 1 refills | Status: DC
  Filled 2021-03-27: qty 60, 30d supply, fill #0

## 2021-03-27 MED ORDER — METFORMIN HCL 500 MG PO TABS: 500.0000 mg | ORAL_TABLET | Freq: Two times a day (BID) | ORAL | 1 refills | Status: DC

## 2021-03-27 NOTE — ED Provider Notes (Signed)
?[facility] ?Provider Note ? ? ?CSN: [id number] ?Arrival date & time: 03/26/21  2042 ? ?  ? ?History ? ?Chief Complaint  ?Patient presents with  ? Hyperglycemia  ? ? ?Robert Ware is a 56 y.o. male. ? ?HPI ?He presents for evaluation of high blood sugar.  He saw his GI doctor yesterday for routine evaluation of cirrhosis.  He complained of polyuria and polydipsia.  Labs were obtained which showed elevated blood sugar and hemoglobin A1c.  These are documented in the EMR.  The patient does not have a PCP, so he decided to come here for evaluation of these laboratory abnormalities, after they were communicated to him by the GI staff. ?  ? ?Home Medications ?Prior to Admission medications   ?Medication Sig Start Date End Date Taking? Authorizing Provider  ?metFORMIN (GLUCOPHAGE) 500 MG tablet Take 1 tablet (500 mg total) by mouth 2 (two) times daily with a meal. 03/27/21  Yes Daleen Bo, MD  ?darunavir (PREZISTA) 800 MG tablet Take 1 tablet (800 mg total) by mouth daily with breakfast. 11/26/20   Esmond Plants, RPH-CPP  ?elvitegravir-cobicistat-emtricitabine-tenofovir (GENVOYA) 150-150-200-10 MG TABS tablet Take 1 tablet by mouth daily with breakfast. 11/26/20   Esmond Plants, RPH-CPP  ?propranolol (INDERAL) 10 MG tablet Take 1 tablet  by mouth 3 times daily. 11/13/20   [name], MD  ?bismuth-metronidazole-tetracycline [facility]) [phone] MG capsule TAKE 3 CAPSULES BY MOUTH 4 TIMES DAILY BEFORE MEALS AND AT BEDTIME FOR 10 DAYS 03/26/20 03/26/20  [name], MD  ?nadolol (CORGARD) 20 MG tablet Take 1 tablet (20 mg total) by mouth daily. 03/22/20 04/23/20  [name], MD  ?   ? ?Allergies    ?Patient has no known allergies.   ? ?Review of Systems   ?Review of Systems ? ?Physical Exam ?Updated Vital Signs ?BP (!) 148/98   Pulse 62   Temp 98.3 ?F (36.8 ?C) (Oral)   Resp 18   SpO2 97%  ?Physical Exam ?Vitals and nursing note reviewed.  ?Constitutional:   ?    General: He is not in acute distress. ?   Appearance: He is well-developed. He is not ill-appearing, toxic-appearing or diaphoretic.  ?HENT:  ?   Head: Normocephalic and atraumatic.  ?   Right Ear: External ear normal.  ?   Left Ear: External ear normal.  ?Eyes:  ?   Conjunctiva/sclera: Conjunctivae normal.  ?   Pupils: Pupils are equal, round, and reactive to light.  ?Neck:  ?   Trachea: Phonation normal.  ?Cardiovascular:  ?   Rate and Rhythm: Normal rate.  ?Pulmonary:  ?   Effort: Pulmonary effort is normal.  ?   Breath sounds: Normal breath sounds.  ?Abdominal:  ?   General: There is no distension.  ?Musculoskeletal:     ?   General: Normal range of motion.  ?   Cervical back: Normal range of motion and neck supple.  ?Skin: ?   General: Skin is warm and dry.  ?Neurological:  ?   Mental Status: He is alert and oriented to person, place, and time.  ?   Cranial Nerves: No cranial nerve deficit.  ?   Sensory: No sensory deficit.  ?   Motor: No abnormal muscle tone.  ?   Coordination: Coordination normal.  ?Psychiatric:     ?   Mood and Affect: Mood normal.     ?   Behavior: Behavior normal.     ?  Thought Content: Thought content normal.     ?   Judgment: Judgment normal.  ? ? ?ED Results / Procedures / Treatments   ?Labs ?(all labs ordered are listed, but only abnormal results are displayed) ?Labs Reviewed  ?CBC WITH DIFFERENTIAL/PLATELET - Abnormal; Notable for the following components:  ?    Result Value  ? WBC 2.9 (*)   ? Platelets 30 (*)   ? Neutro Abs 1.6 (*)   ? All other components within normal limits  ?BASIC METABOLIC PANEL - Abnormal; Notable for the following components:  ? Sodium 133 (*)   ? Glucose, Bld 392 (*)   ? All other components within normal limits  ?URINALYSIS, ROUTINE W REFLEX MICROSCOPIC - Abnormal; Notable for the following components:  ? Glucose, UA >=500 (*)   ? Hgb urine dipstick SMALL (*)   ? All other components within normal limits  ?CBG MONITORING, ED - Abnormal; Notable for the  following components:  ? Glucose-Capillary 364 (*)   ? All other components within normal limits  ?CBG MONITORING, ED - Abnormal; Notable for the following components:  ? Glucose-Capillary 268 (*)   ? All other components within normal limits  ? ? ?EKG ?None ? ?Radiology ?No results found. ? ?Procedures ?Procedures  ? ? ?Medications Ordered in ED ?Medications - No data to display ? ?ED Course/ Medical Decision Making/ A&P ?  ?                        ?Medical Decision Making ?Patient here for evaluation elevated blood sugar and hemoglobin A1c, found during evaluation as an outpatient for polyuria and polydipsia.  No history of diabetes ? ?Problems Addressed: ?Type 2 diabetes mellitus with other specified complication, without long-term current use of insulin ([place]): acute illness or injury ?   Details: Symptomatic for diabetes but no prior diagnosis of diabetes. ? ?Amount and/or Complexity of Data Reviewed ?Independent Historian:  ?   Details: He is a cogent historian ?External Data Reviewed: labs and notes. ?   Details: Notes from GI visit, yesterday in the EMR including laboratory evaluations.  Some of these tests are pending at this time. ?Labs: ordered. ?   Details: CBC, metabolic panel, urinalysis ? ?Risk ?Prescription drug management. ?Decision regarding hospitalization. ?Risk Details: Patient with incidental diabetes found during routine testing by his GI doctor yesterday.  Chronic ongoing cirrhosis without acute complications.  Diabetes without acidosis.  No indicator for significant volume depletion requiring intravenous fluids.  He does not have a PCP.  I will give him 2 months prescription for metformin, with instructions on low carbohydrate diet to help control blood sugar.  Patient does not require hospitalization or further ED intervention at this time. ? ? ? ? ? ? ? ? ? ? ?Final Clinical Impression(s) / ED Diagnoses ?Final diagnoses:  ?Type 2 diabetes mellitus with other specified complication, without  long-term current use of insulin ([place])  ? ? ?Rx / DC Orders ?ED Discharge Orders   ? ?      Ordered  ?  metFORMIN (GLUCOPHAGE) 500 MG tablet  2 times daily with meals,   Status:  Discontinued       ? 03/27/21 0816  ?  metFORMIN (GLUCOPHAGE) 500 MG tablet  2 times daily with meals       ? 03/27/21 0818  ? ?  ?  ? ?  ? ? ?  ?Daleen Bo, MD ?03/27/21 [id number] ? ?

## 2021-03-27 NOTE — Discharge Instructions (Addendum)
You have diabetes that needs to be treated, and you need to control your diet.  We are prescribing metformin to help.  It is important to began to watch your carbohydrate intake.  This is essentially sugar that is contributing to your diabetes.  See the attached information.  You will need to follow-up with a primary care doctor within a couple of weeks for further care and treatment.  Use the attached resource guide to help you find a doctor.  Start taking the metformin today.  It also helps to drink 2 L of water every day. ?

## 2021-03-28 ENCOUNTER — Telehealth: Payer: Self-pay

## 2021-03-28 DIAGNOSIS — E1369 Other specified diabetes mellitus with other specified complication: Secondary | ICD-10-CM

## 2021-03-28 LAB — URINE CULTURE
MICRO NUMBER:: 13079518
SPECIMEN QUALITY:: ADEQUATE

## 2021-03-28 NOTE — Telephone Encounter (Signed)
Robert Banister, MD  Robert Lasso, RN ?Demontrez Rindfleisch,  ?I just diagnosed him with diabetes, he was having polydipsia, polyuria, losing weight.  He went to the emergency room and they put him on Glucophage.  I think he is going to need much more comprehensive diabetic care and I am not expert enough to provide that.  Can you please help him get set up to see a primary care provider on a rather expedited, urgent basis.  I prefer [facility] provider if that is possible and okay with his insurance.  Thanks very much  ?

## 2021-03-28 NOTE — Telephone Encounter (Signed)
I spoke with the pt and he tells me that he has spoken with [facility] at [facility].  He says he is waiting on a return call about a possible appt on Monday.  I did enter a referral as well as URGENT.  He will call me back if he does not get scheduled in the next few days.  ?

## 2021-04-01 ENCOUNTER — Other Ambulatory Visit: Payer: Self-pay

## 2021-04-01 ENCOUNTER — Ambulatory Visit (HOSPITAL_COMMUNITY): Admission: RE | Admit: 2021-04-01 | Payer: PRIVATE HEALTH INSURANCE | Source: Ambulatory Visit

## 2021-04-01 NOTE — Telephone Encounter (Signed)
The pt has been given the information for the appt at [facility] at [facility]  ?

## 2021-04-01 NOTE — Telephone Encounter (Signed)
Dr Ardis Hughs the first available appt we could get the pt is 04/15/21 at 9 am with Mack Hook NP.  She is the only one taking new patients.   ?

## 2021-04-01 NOTE — Telephone Encounter (Signed)
Patient is calling back requesting to speak with you for further assistants. Please call him. ?

## 2021-04-01 NOTE — Telephone Encounter (Signed)
Left a voicemail asking for a call back from nurse Patty. ?

## 2021-04-04 ENCOUNTER — Other Ambulatory Visit: Payer: Self-pay

## 2021-04-04 ENCOUNTER — Other Ambulatory Visit (HOSPITAL_COMMUNITY): Payer: Self-pay

## 2021-04-04 MED ORDER — PROPRANOLOL HCL 10 MG PO TABS
10.0000 mg | ORAL_TABLET | Freq: Three times a day (TID) | ORAL | 3 refills | Status: DC
Start: 2021-04-04 — End: 2022-05-15
  Filled 2021-04-04: qty 270, 90d supply, fill #0
  Filled 2021-07-25: qty 270, 90d supply, fill #1
  Filled 2021-11-21: qty 270, 90d supply, fill #2

## 2021-04-05 ENCOUNTER — Other Ambulatory Visit (HOSPITAL_COMMUNITY): Payer: Self-pay

## 2021-04-11 ENCOUNTER — Ambulatory Visit (HOSPITAL_COMMUNITY)
Admission: RE | Admit: 2021-04-11 | Discharge: 2021-04-11 | Disposition: A | Discharge status: Home / Self Care | Payer: PRIVATE HEALTH INSURANCE | Source: Ambulatory Visit | Attending: Gastroenterology | Admitting: Gastroenterology

## 2021-04-11 ENCOUNTER — Other Ambulatory Visit: Payer: Self-pay

## 2021-04-11 DIAGNOSIS — K7460 Unspecified cirrhosis of liver: Secondary | ICD-10-CM | POA: Insufficient documentation

## 2021-04-11 DIAGNOSIS — R350 Frequency of micturition: Secondary | ICD-10-CM | POA: Insufficient documentation

## 2021-04-14 NOTE — Progress Notes (Signed)
? ?  Subjective:  ? ? Patient ID: Robert Ware, male    DOB: Apr 29, 1965, 56 y.o.   MRN: [id number] ? ?HPI ?Chief Complaint  ?Patient presents with  ? Establish Care  ?  No concerns  ? ?He is new to the practice and here today for new onset type 2 diabetes with a Hgb A1c 13.2%. Labs done by GI, Dr. Ardis Hughs. He was symptomatic with polydipsia and polyuria at his GI appt.  ?He also went to the ED for evaluation. Started on Metformin. Reports he is taking it twice daily, having some loose stool but tolerating it well.  ?Denies vision changes, chest pain, palpitations, shortness of breath, abdominal pain, N/V/D or urinary symptoms. No polydipsia.  ? ? ?PMH: ?HIV- treated and followed by Dr. Megan Salon.  ? ?Cirrhosis- followed by Dr. Ardis Hughs.  ? ?Thrombocytopenia- denies bleeding ? ?No fever, chills, headache dizziness.  ? ?Reviewed allergies, medications, past medical, surgical, family, and social history. ? ? ? ? ?Review of Systems ?Pertinent positives and negatives in the history of present illness. ?   ?Objective:  ? Physical Exam ?BP 122/80 (BP Location: Left Arm, Patient Position: Sitting, Cuff Size: Large)   Pulse 60   Temp 98 ?F (36.7 ?C) (Oral)   Ht 6' (1.829 m)   Wt 228 lb 9.6 oz (103.7 kg)   SpO2 99%   BMI 31.00 kg/m?  ? ?Alert and in no distress.  Cardiac exam shows a regular rate and rhythm. Lungs are clear to auscultation. Abdomen is slightly distended, non tender, normal BS. Extremities without edema. Skin is warm and dry. Normal speech, mood and thought process.  ? ?POC glucose finger stick 199 ? ?   ?Assessment & Plan:  ?New onset type 2 diabetes mellitus ([place]) - Plan: POCT CBG (Fasting - Glucose), Ambulatory referral to Endocrinology, CBC with Differential/Platelet, Basic metabolic panel, TSH, Lipid panel, Urine Microscopic, PR DISP HOME GLUCOSE MONITOR, Urine Microscopic, Basic metabolic panel, CBC with Differential/Platelet ? ?Human immunodeficiency virus (HIV) disease ([place]) ? ?Other cirrhosis of  liver ([place]) - Plan: Ambulatory referral to Endocrinology ? ?Thrombocytopenia ([place]) - Plan: CBC with Differential/Platelet, CBC with Differential/Platelet ? ?Obesity (BMI 30.0-34.9) ? ?He is new to the practice and here to establish care. New onset DM with A1c 13.2% at his GI office on 03/26/2021 ?In depth counseling on diabetes spectrum, diet, checking BS at home. Education provided on monitoring glucose by CMA Hannah and sample monitor with supplies provided.  ?Offered referral to diabetes nutrition specialist and he declines.  ?Lantus started, sample pen provided- 5 units daily unless FBS is <80 that morning then he will skip the dose for that day.  ?Continue low dose metformin bid as started in the ED.  ?POC glucose finger stick 199. Asymptomatic.  ?Urine and labs ordered.  ?Referral to Endocrinologist for management of diabetes since he will require advanced care due to cirrhosis and HIV.  ?He will follow up here in 2 weeks if he is unable to secure an endocrinology visit prior to that date.  ?Statin not initiated due to severe liver disease.  ? ? ?

## 2021-04-15 ENCOUNTER — Telehealth: Payer: Self-pay

## 2021-04-15 ENCOUNTER — Other Ambulatory Visit: Payer: Self-pay

## 2021-04-15 ENCOUNTER — Telehealth: Payer: Self-pay | Admitting: Gastroenterology

## 2021-04-15 ENCOUNTER — Ambulatory Visit (INDEPENDENT_AMBULATORY_CARE_PROVIDER_SITE_OTHER): Payer: PRIVATE HEALTH INSURANCE | Admitting: Family Medicine

## 2021-04-15 ENCOUNTER — Encounter: Payer: Self-pay | Admitting: Family Medicine

## 2021-04-15 VITALS — BP 122/80 | HR 60 | Temp 98.0°F | Ht 72.0 in | Wt 228.6 lb

## 2021-04-15 DIAGNOSIS — E119 Type 2 diabetes mellitus without complications: Secondary | ICD-10-CM

## 2021-04-15 DIAGNOSIS — K7469 Other cirrhosis of liver: Secondary | ICD-10-CM | POA: Diagnosis not present

## 2021-04-15 DIAGNOSIS — B20 Human immunodeficiency virus [HIV] disease: Secondary | ICD-10-CM

## 2021-04-15 DIAGNOSIS — D696 Thrombocytopenia, unspecified: Secondary | ICD-10-CM

## 2021-04-15 DIAGNOSIS — E669 Obesity, unspecified: Secondary | ICD-10-CM | POA: Insufficient documentation

## 2021-04-15 LAB — CBC WITH DIFFERENTIAL/PLATELET
Basophils Absolute: 0 10*3/uL (ref 0.0–0.1)
Basophils Relative: 0.5 % (ref 0.0–3.0)
Eosinophils Absolute: 0 10*3/uL (ref 0.0–0.7)
Eosinophils Relative: 1.2 % (ref 0.0–5.0)
HCT: 43.2 % (ref 39.0–52.0)
Hemoglobin: 14.9 g/dL (ref 13.0–17.0)
Lymphocytes Relative: 42.2 % (ref 12.0–46.0)
Lymphs Abs: 0.9 10*3/uL (ref 0.7–4.0)
MCHC: 34.6 g/dL (ref 30.0–36.0)
MCV: 85.1 fl (ref 78.0–100.0)
Monocytes Absolute: 0.2 10*3/uL (ref 0.1–1.0)
Monocytes Relative: 8.5 % (ref 3.0–12.0)
Neutro Abs: 1 10*3/uL — ABNORMAL LOW (ref 1.4–7.7)
Neutrophils Relative %: 47.6 % (ref 43.0–77.0)
Platelets: 30 10*3/uL — CL (ref 150.0–400.0)
RBC: 5.08 Mil/uL (ref 4.22–5.81)
RDW: 15.2 % (ref 11.5–15.5)
WBC: 2.1 10*3/uL — ABNORMAL LOW (ref 4.0–10.5)

## 2021-04-15 LAB — BASIC METABOLIC PANEL
BUN: 8 mg/dL (ref 6–23)
CO2: 24 mEq/L (ref 19–32)
Calcium: 9.2 mg/dL (ref 8.4–10.5)
Chloride: 103 mEq/L (ref 96–112)
Creatinine, Ser: 0.83 mg/dL (ref 0.40–1.50)
GFR: 97.62 mL/min (ref >60.00)
Glucose, Bld: 182 mg/dL — ABNORMAL HIGH (ref 70–99)
Potassium: 4.2 mEq/L (ref 3.5–5.1)
Sodium: 134 mEq/L — ABNORMAL LOW (ref 135–145)

## 2021-04-15 LAB — POCT CBG (FASTING - GLUCOSE)-MANUAL ENTRY: Glucose Fasting, POC: 199 mg/dL — AB (ref 70–99)

## 2021-04-15 LAB — LIPID PANEL
Cholesterol: 183 mg/dL (ref 0–200)
HDL: 50.9 mg/dL (ref >39.00)
NonHDL: 132.14
Total CHOL/HDL Ratio: 4
Triglycerides: 228 mg/dL — ABNORMAL HIGH (ref 0.0–149.0)
VLDL: 45.6 mg/dL — ABNORMAL HIGH (ref 0.0–40.0)

## 2021-04-15 LAB — LDL CHOLESTEROL, DIRECT: Direct LDL: 76 mg/dL

## 2021-04-15 LAB — URINALYSIS, MICROSCOPIC ONLY: WBC, UA: NONE SEEN (ref 0–?)

## 2021-04-15 LAB — TSH: TSH: 1.27 u[IU]/mL (ref 0.35–5.50)

## 2021-04-15 NOTE — Telephone Encounter (Signed)
This is consistent with his baseline so no urgent action is needed.  ?

## 2021-04-15 NOTE — Patient Instructions (Addendum)
Continue taking metformin twice daily with meals.  ?Start Lantus insulin pen 5 units once daily.  ? ?Check your blood sugar in the morning before breakfast  ?Goal blood sugar reading (80-130) ? ?If your blood sugar is below 80- Do not take the insulin that day.  ? ?Also, check your blood sugar 2 hours after dinner.  ?Goal readings then are (130-160).  ? ?Keep a record of your blood sugar readings.  ? ?You will hear from [facility] to schedule a visit to manage your diabetes.  ? ? ? ?Diabetes Mellitus and Nutrition, Adult ?When you have diabetes, or diabetes mellitus, it is very important to have healthy eating habits because your blood sugar (glucose) levels are greatly affected by what you eat and drink. Eating healthy foods in the right amounts, at about the same times every day, can help you: ?Manage your blood glucose. ?Lower your risk of heart disease. ?Improve your blood pressure. ?Reach or maintain a healthy weight. ?What can affect my meal plan? ?Every person with diabetes is different, and each person has different needs for a meal plan. Your health care provider may recommend that you work with a dietitian to make a meal plan that is best for you. Your meal plan may vary depending on factors such as: ?The calories you need. ?The medicines you take. ?Your weight. ?Your blood glucose, blood pressure, and cholesterol levels. ?Your activity level. ?Other health conditions you have, such as heart or kidney disease. ?How do carbohydrates affect me? ?Carbohydrates, also called carbs, affect your blood glucose level more than any other type of food. Eating carbs raises the amount of glucose in your blood. ?It is important to know how many carbs you can safely have in each meal. This is different for every person. Your dietitian can help you calculate how many carbs you should have at each meal and for each snack. ?How does alcohol affect me? ?Alcohol can cause a decrease in blood glucose (hypoglycemia),  especially if you use insulin or take certain diabetes medicines by mouth. Hypoglycemia can be a life-threatening condition. Symptoms of hypoglycemia, such as sleepiness, dizziness, and confusion, are similar to symptoms of having too much alcohol. ?Do not drink alcohol if: ?Your health care provider tells you not to drink. ?You are pregnant, may be pregnant, or are planning to become pregnant. ?If you drink alcohol: ?Limit how much you have to: ?0-1 drink a day for women. ?0-2 drinks a day for men. ?Know how much alcohol is in your drink. In the [place] one drink equals one 12 oz bottle of beer (355 mL), one 5 oz glass of wine (148 mL), or one 1? oz glass of hard liquor (44 mL). ?Keep yourself hydrated with water, diet soda, or unsweetened iced tea. Keep in mind that regular soda, juice, and other mixers may contain a lot of sugar and must be counted as carbs. ?What are tips for following this plan? ?Reading food labels ?Start by checking the serving size on the Nutrition Facts label of packaged foods and drinks. The number of calories and the amount of carbs, fats, and other nutrients listed on the label are based on one serving of the item. Many items contain more than one serving per package. ?Check the total grams (g) of carbs in one serving. ?Check the number of grams of saturated fats and trans fats in one serving. Choose foods that have a low amount or none of these fats. ?Check the number of milligrams (mg) of  salt (sodium) in one serving. Most people should limit total sodium intake to less than 2,300 mg per day. ?Always check the nutrition information of foods labeled as "low-fat" or "nonfat." These foods may be higher in added sugar or refined carbs and should be avoided. ?Talk to your dietitian to identify your daily goals for nutrients listed on the label. ?Shopping ?Avoid buying canned, pre-made, or processed foods. These foods tend to be high in fat, sodium, and added sugar. ?Shop around the outside  edge of the grocery store. This is where you will most often find fresh fruits and vegetables, bulk grains, fresh meats, and fresh dairy products. ?Cooking ?Use low-heat cooking methods, such as baking, instead of high-heat cooking methods, such as deep frying. ?Cook using healthy oils, such as olive, canola, or sunflower oil. ?Avoid cooking with butter, cream, or high-fat meats. ?Meal planning ?Eat meals and snacks regularly, preferably at the same times every day. Avoid going long periods of time without eating. ?Eat foods that are high in fiber, such as fresh fruits, vegetables, beans, and whole grains. ?Eat 4-6 oz (112-168 g) of lean protein each day, such as lean meat, chicken, fish, eggs, or tofu. One ounce (oz) (28 g) of lean protein is equal to: ?1 oz (28 g) of meat, chicken, or fish. ?1 egg. ?? cup (62 g) of tofu. ?Eat some foods each day that contain healthy fats, such as avocado, nuts, seeds, and fish. ?What foods should I eat? ?Fruits ?Berries. Apples. Oranges. Peaches. Apricots. Plums. Grapes. Mangoes. Papayas. Pomegranates. Kiwi. Cherries. ?Vegetables ?Leafy greens, including lettuce, spinach, kale, chard, collard greens, mustard greens, and cabbage. Beets. Cauliflower. Broccoli. Carrots. Green beans. Tomatoes. Peppers. Onions. Cucumbers. Brussels sprouts. ?Grains ?Whole grains, such as whole-wheat or whole-grain bread, crackers, tortillas, cereal, and pasta. Unsweetened oatmeal. Quinoa. Brown or wild rice. ?Meats and other proteins ?Seafood. Poultry without skin. Lean cuts of poultry and beef. Tofu. Nuts. Seeds. ?Dairy ?Low-fat or fat-free dairy products such as milk, yogurt, and cheese. ?The items listed above may not be a complete list of foods and beverages you can eat and drink. Contact a dietitian for more information. ?What foods should I avoid? ?Fruits ?Fruits canned with syrup. ?Vegetables ?Canned vegetables. Frozen vegetables with butter or cream sauce. ?Grains ?Refined white flour and flour  products such as bread, pasta, snack foods, and cereals. Avoid all processed foods. ?Meats and other proteins ?Fatty cuts of meat. Poultry with skin. Breaded or fried meats. Processed meat. Avoid saturated fats. ?Dairy ?Full-fat yogurt, cheese, or milk. ?Beverages ?Sweetened drinks, such as soda or iced tea. ?The items listed above may not be a complete list of foods and beverages you should avoid. Contact a dietitian for more information. ?Questions to ask a health care provider ?Do I need to meet with a certified diabetes care and education specialist? ?Do I need to meet with a dietitian? ?What number can I call if I have questions? ?When are the best times to check my blood glucose? ?Where to find more information: ?American Diabetes Association: diabetes.org ?Academy of Nutrition and Dietetics: eatright.org ?[employer] of Diabetes and Digestive and Kidney Diseases: AmenCredit.is ?Association of Diabetes Care & Education Specialists: diabeteseducator.org ?Summary ?It is important to have healthy eating habits because your blood sugar (glucose) levels are greatly affected by what you eat and drink. It is important to use alcohol carefully. ?A healthy meal plan will help you manage your blood glucose and lower your risk of heart disease. ?Your health care provider may recommend  that you work with a dietitian to make a meal plan that is best for you. ?This information is not intended to replace advice given to you by your health care provider. Make sure you discuss any questions you have with your health care provider. ?Document Revised: 08/16/2019 Document Reviewed: 08/16/2019 ?Elsevier Patient Education ? [place]. ? ?

## 2021-04-15 NOTE — Telephone Encounter (Signed)
Patient is returning your call.  

## 2021-04-15 NOTE — Telephone Encounter (Signed)
Patient aware of ultrasound results per Dr Ardis Hughs.  Patient advised that he will need a RUQ ultrasound and lab work prior to follow up appointment in 6 months.  Staff message sent to myself as a reminder.  Patient agreed to plan and verbalized understanding.  No further questions. ?

## 2021-04-15 NOTE — Telephone Encounter (Signed)
CRITICAL VALUE STICKER ? ?CRITICAL VALUE: Platelet count of 30,000 ? ?RECEIVER (on-site recipient of call): Jarrett Soho ? ?DATE & TIME NOTIFIED: 04/15/2021 1:45 pm ? ?MESSENGER (representative from lab): Santiago Glad ? ?MD NOTIFIED: Dr. Sharlet Salina ? ?TIME OF NOTIFICATION: 1:46 pm ? ?

## 2021-04-17 NOTE — Progress Notes (Signed)
Please call him and see if he is having any issues checking his blood sugar or with the insulin pen. Ask him for some blood sugar readings also. Thanks.

## 2021-04-30 ENCOUNTER — Other Ambulatory Visit (HOSPITAL_COMMUNITY): Payer: Self-pay

## 2021-04-30 ENCOUNTER — Other Ambulatory Visit: Payer: Self-pay

## 2021-04-30 ENCOUNTER — Ambulatory Visit (INDEPENDENT_AMBULATORY_CARE_PROVIDER_SITE_OTHER): Payer: PRIVATE HEALTH INSURANCE | Admitting: Family Medicine

## 2021-04-30 ENCOUNTER — Encounter: Payer: Self-pay | Admitting: Family Medicine

## 2021-04-30 VITALS — BP 120/76 | HR 60 | Temp 97.7°F | Ht 72.0 in | Wt 225.6 lb

## 2021-04-30 DIAGNOSIS — E119 Type 2 diabetes mellitus without complications: Secondary | ICD-10-CM

## 2021-04-30 DIAGNOSIS — K7469 Other cirrhosis of liver: Secondary | ICD-10-CM

## 2021-04-30 LAB — POCT CBG (FASTING - GLUCOSE)-MANUAL ENTRY: Glucose Fasting, POC: 100 mg/dL — AB (ref 70–99)

## 2021-04-30 MED ORDER — MICROLET LANCETS MISC
3 refills | Status: AC
  Filled 2021-04-30 – 2021-05-30 (×2): qty 100, 90d supply, fill #0
  Filled 2021-07-05: qty 100, 50d supply, fill #0

## 2021-04-30 MED ORDER — CONTOUR NEXT TEST VI STRP
ORAL_STRIP | 3 refills | Status: AC
  Filled 2021-04-30: qty 100, 90d supply, fill #0
  Filled 2021-05-30 – 2021-07-05 (×2): qty 100, 50d supply, fill #0

## 2021-04-30 MED ORDER — METFORMIN HCL 500 MG PO TABS
500.0000 mg | ORAL_TABLET | Freq: Two times a day (BID) | ORAL | 1 refills | Status: DC
  Filled 2021-04-30 – 2021-05-30 (×2): qty 60, 30d supply, fill #0
  Filled 2021-07-05: qty 60, 30d supply, fill #1

## 2021-04-30 NOTE — Progress Notes (Signed)
? ?  Subjective:  ? ? Patient ID: Robert Ware, male    DOB: [birth year], 56 y.o.   MRN: [id number] ? ?HPI ?Chief Complaint  ?Patient presents with  ? Diabetes  ?  Diabetes f/u. Did not bring log but states blood sugars have been ranging in 140s and 130s. Did not check this morning. ?Yesterday morning 142 after dinner 129 ?  ? ?Here to follow up on newly diagnosed diabetes with a Hgb A1c >13% ? ?States he is checking BS at home and his readings have been in the 120s-140s.  ? ?No longer having polyria or polydipsia. States he feels better overall.  ? ?He has been taking metformin bid without any concerns.  ?Using Lantus 5 units daily.  ? ?He has been referred to diabetes nutritionist and endocrinology.  States he has not heard from either yet. ? ?No new concerns today. ? ?No fever, chills, headache, dizziness, chest pain, palpitations, shortness of breath, abdominal pain, nausea, vomiting or diarrhea. ? ? ?Review of Systems ?Pertinent positives and negatives in the history of present illness. ? ?   ?Objective:  ? Physical Exam ?BP 120/76 (BP Location: Left Arm, Patient Position: Sitting, Cuff Size: Large)   Pulse 60   Temp 97.7 ?F (36.5 ?C) (Temporal)   Ht 6' (1.829 m)   Wt 225 lb 9.6 oz (102.3 kg)   SpO2 98%   BMI 30.60 kg/m?  ? ?Alert and oriented in no acute distress.  Respirations unlabored. ? ? ?  ?Assessment & Plan:  ?New onset type 2 diabetes mellitus ([place]) - Plan: metFORMIN (GLUCOPHAGE) 500 MG tablet, POCT CBG (Fasting - Glucose), glucose blood (CONTOUR NEXT TEST) test strip, Microlet Lancets MISC ? ?Other cirrhosis of liver ([place]) ? ?Fasting blood sugar 100 in the office. ?He will stop Lantus. ?Continue metformin twice daily. ?Continue checking blood sugar at home. ?I have referred him to MNT and endocrinology and hopefully he will be scheduled for a visit soon due to his complex medical history with cirrhosis and HIV. ?

## 2021-04-30 NOTE — Patient Instructions (Signed)
Continue checking your blood sugars at home.  ? ?STOP the insulin since your blood sugars have improved significantly.  ?Continue metformin twice daily.  ? ?You should hear from the diabetes specialist and nutrition specialist soon.  ? ? ?

## 2021-05-09 ENCOUNTER — Other Ambulatory Visit (HOSPITAL_COMMUNITY): Payer: Self-pay

## 2021-05-30 ENCOUNTER — Other Ambulatory Visit (HOSPITAL_COMMUNITY): Payer: Self-pay

## 2021-05-30 MED ORDER — CONTOUR NEXT EZ W/DEVICE KIT
PACK | 0 refills | Status: AC
Start: 2021-05-30 — End: ?
  Filled 2021-05-30 – 2021-07-05 (×3): qty 1, 30d supply, fill #0

## 2021-06-02 ENCOUNTER — Other Ambulatory Visit (HOSPITAL_COMMUNITY): Payer: Self-pay

## 2021-06-11 ENCOUNTER — Other Ambulatory Visit (HOSPITAL_COMMUNITY): Payer: Self-pay

## 2021-07-05 ENCOUNTER — Other Ambulatory Visit (HOSPITAL_COMMUNITY): Payer: Self-pay

## 2021-07-14 ENCOUNTER — Other Ambulatory Visit (HOSPITAL_COMMUNITY): Payer: Self-pay

## 2021-07-25 ENCOUNTER — Other Ambulatory Visit (HOSPITAL_COMMUNITY): Payer: Self-pay

## 2021-07-30 ENCOUNTER — Telehealth: Payer: Self-pay

## 2021-07-30 ENCOUNTER — Encounter: Payer: Self-pay | Admitting: Family Medicine

## 2021-07-30 ENCOUNTER — Other Ambulatory Visit (HOSPITAL_COMMUNITY): Payer: Self-pay

## 2021-07-30 ENCOUNTER — Ambulatory Visit (INDEPENDENT_AMBULATORY_CARE_PROVIDER_SITE_OTHER): Payer: PRIVATE HEALTH INSURANCE | Admitting: Family Medicine

## 2021-07-30 VITALS — BP 130/78 | HR 50 | Temp 97.8°F | Ht 72.0 in | Wt 236.0 lb

## 2021-07-30 DIAGNOSIS — K7469 Other cirrhosis of liver: Secondary | ICD-10-CM | POA: Diagnosis not present

## 2021-07-30 DIAGNOSIS — D696 Thrombocytopenia, unspecified: Secondary | ICD-10-CM | POA: Diagnosis not present

## 2021-07-30 DIAGNOSIS — E119 Type 2 diabetes mellitus without complications: Secondary | ICD-10-CM

## 2021-07-30 DIAGNOSIS — R21 Rash and other nonspecific skin eruption: Secondary | ICD-10-CM

## 2021-07-30 DIAGNOSIS — B20 Human immunodeficiency virus [HIV] disease: Secondary | ICD-10-CM | POA: Diagnosis not present

## 2021-07-30 DIAGNOSIS — E7800 Pure hypercholesterolemia, unspecified: Secondary | ICD-10-CM | POA: Diagnosis not present

## 2021-07-30 LAB — CBC WITH DIFFERENTIAL/PLATELET
Basophils Absolute: 0 10*3/uL (ref 0.0–0.1)
Basophils Relative: 0.5 % (ref 0.0–3.0)
Eosinophils Absolute: 0 10*3/uL (ref 0.0–0.7)
Eosinophils Relative: 2.2 % (ref 0.0–5.0)
HCT: 43.1 % (ref 39.0–52.0)
Hemoglobin: 14.7 g/dL (ref 13.0–17.0)
Lymphocytes Relative: 36.9 % (ref 12.0–46.0)
Lymphs Abs: 0.8 10*3/uL (ref 0.7–4.0)
MCHC: 34.2 g/dL (ref 30.0–36.0)
MCV: 87.4 fl (ref 78.0–100.0)
Monocytes Absolute: 0.2 10*3/uL (ref 0.1–1.0)
Monocytes Relative: 9 % (ref 3.0–12.0)
Neutro Abs: 1.2 10*3/uL — ABNORMAL LOW (ref 1.4–7.7)
Neutrophils Relative %: 51.4 % (ref 43.0–77.0)
Platelets: 27 10*3/uL — CL (ref 150.0–400.0)
RBC: 4.93 Mil/uL (ref 4.22–5.81)
RDW: 16.6 % — ABNORMAL HIGH (ref 11.5–15.5)
WBC: 2.3 10*3/uL — ABNORMAL LOW (ref 4.0–10.5)

## 2021-07-30 LAB — COMPREHENSIVE METABOLIC PANEL
ALT: 54 U/L — ABNORMAL HIGH (ref 0–53)
AST: 77 U/L — ABNORMAL HIGH (ref 0–37)
Albumin: 3.8 g/dL (ref 3.5–5.2)
Alkaline Phosphatase: 59 U/L (ref 39–117)
BUN: 8 mg/dL (ref 6–23)
CO2: 22 mEq/L (ref 19–32)
Calcium: 9.1 mg/dL (ref 8.4–10.5)
Chloride: 106 mEq/L (ref 96–112)
Creatinine, Ser: 0.89 mg/dL (ref 0.40–1.50)
GFR: 95.39 mL/min (ref >60.00)
Glucose, Bld: 99 mg/dL (ref 70–99)
Potassium: 3.9 mEq/L (ref 3.5–5.1)
Sodium: 137 mEq/L (ref 135–145)
Total Bilirubin: 1.4 mg/dL — ABNORMAL HIGH (ref 0.2–1.2)
Total Protein: 7 g/dL (ref 6.0–8.3)

## 2021-07-30 LAB — TSH: TSH: 2.73 u[IU]/mL (ref 0.35–5.50)

## 2021-07-30 LAB — POCT GLYCOSYLATED HEMOGLOBIN (HGB A1C): Hemoglobin A1C: 6 % — AB (ref 4.0–5.6)

## 2021-07-30 LAB — VITAMIN B12: Vitamin B-12: >1504 pg/mL — ABNORMAL HIGH (ref 211–911)

## 2021-07-30 LAB — POCT GLUCOSE (DEVICE FOR HOME USE): Glucose Fasting, POC: 107 mg/dL — AB (ref 70–99)

## 2021-07-30 LAB — LIPID PANEL
Cholesterol: 184 mg/dL (ref 0–200)
HDL: 65.3 mg/dL (ref >39.00)
LDL Cholesterol: 104 mg/dL — ABNORMAL HIGH (ref 0–99)
NonHDL: 119.06
Total CHOL/HDL Ratio: 3
Triglycerides: 76 mg/dL (ref 0.0–149.0)
VLDL: 15.2 mg/dL (ref 0.0–40.0)

## 2021-07-30 LAB — MICROALBUMIN / CREATININE URINE RATIO
Creatinine,U: 135.2 mg/dL
Microalb Creat Ratio: 0.5 mg/g (ref 0.0–30.0)
Microalb, Ur: <0.7 mg/dL (ref 0.0–1.9)

## 2021-07-30 MED ORDER — ROSUVASTATIN CALCIUM 5 MG PO TABS
5.0000 mg | ORAL_TABLET | Freq: Every day | ORAL | 1 refills | Status: DC
  Filled 2021-07-30: qty 90, 90d supply, fill #0

## 2021-07-30 MED ORDER — KETOCONAZOLE 2 % EX CREA
1.0000 | TOPICAL_CREAM | Freq: Two times a day (BID) | CUTANEOUS | 0 refills | Status: AC
  Filled 2021-07-30: qty 60, 30d supply, fill #0

## 2021-07-30 MED ORDER — METFORMIN HCL 500 MG PO TABS
500.0000 mg | ORAL_TABLET | Freq: Every day | ORAL | 1 refills | Status: DC
  Filled 2021-07-30: qty 90, 90d supply, fill #0
  Filled 2021-11-21: qty 90, 90d supply, fill #1

## 2021-07-30 NOTE — Patient Instructions (Addendum)
Your blood sugars have improved significantly and I am reducing your metformin to once daily.  Keep up the great work!  Start on rosuvastatin, cholesterol medication, once daily.  I have also prescribed a cream for the rash on your lower abdomen.  Let me know if it is not improving.  Continue checking your blood sugars at home and eating a healthy diet.  I will see you back in 6 weeks and please come in fasting to the visit

## 2021-07-30 NOTE — Telephone Encounter (Signed)
Called pt and relayed results. Pt verbalized understanding and states he will reach out to his GI doctor to schedule an appt this afternoon.

## 2021-07-30 NOTE — Assessment & Plan Note (Signed)
New diabetes approximately 4 months ago with an A1c of 13.2%. He was taking basal insulin initially but we were able to stop this at his last visit.  He has been taking metformin 500 mg twice daily.  Significant lifestyle changes. Hemoglobin A1c improved to 6.0% today. Reduce metformin to 500 mg once daily.  Add statin therapy. Urine microalbumin/creatinine ratio ordered as well as labs. Recommend diabetic eye exam. Follow-up in 6 weeks in office fasting.

## 2021-07-30 NOTE — Assessment & Plan Note (Signed)
Start on low dose Crestor today.discussed.  Discussed significance of hyperlipidemia with diabetes and the increased risk of atherosclerosis, heart disease and stroke.  He is willing to start on this medication. Recheck lipids and liver function in 6 weeks.

## 2021-07-30 NOTE — Progress Notes (Signed)
Subjective:     Patient ID: Robert Ware, male    DOB: 07-24-1965, 56 y.o.   MRN: [id number]  Chief Complaint  Patient presents with   Diabetes    Blood sugars ranging around 128 in morning before he eats and after he eats around 140, evenings goes up 140-145    HPI Patient is in today for follow up on diabetes in the setting of cirrhosis and HIV.   Blood sugars at home ranging between 120s-140s.  States he has made a lot of healthy changes to lifestyle and diet.   Previous Hgb A1c 13.2% on 03/26/2021  He has not started a statin yet.   Thrombocytopenia- denies bleeding.    New concerns today includes a pruritic rash on lower abdomen. States this is a recurrent rash and in the past he was prescribed topical medication for it. States it has never completely cleared up.   Denies fever, chills, dizziness, chest pain, palpitations, shortness of breath, abdominal pain, N/V/D, urinary symptoms, LE edema.      Health Maintenance Due  Topic Date Due   FOOT EXAM  Never done   OPHTHALMOLOGY EXAM  Never done   URINE MICROALBUMIN  Never done   Zoster Vaccines- Shingrix (1 of 2) Never done   TETANUS/TDAP  04/12/2016    Past Medical History:  Diagnosis Date   Cirrhosis ([place])    PT DENIES   Condyloma    ED (erectile dysfunction)    Elevated liver enzymes    GERD (gastroesophageal reflux disease)    HIV (human immunodeficiency virus infection) ([place])    Seborrhea    Splenomegaly    Thrombocytopenia ([place])    Warts     Past Surgical History:  Procedure Laterality Date   BIOPSY  03/21/2020   Procedure: BIOPSY;  Surgeon: [name], MD;  Location: [facility];  Service: Endoscopy;;   COLONOSCOPY WITH PROPOFOL N/A 03/21/2020   Procedure: COLONOSCOPY WITH PROPOFOL;  Surgeon: [name], MD;  Location: [facility];  Service: Endoscopy;  Laterality: N/A;   CONDYLOMA EXCISION/FULGURATION  2007   CONDYLOMA EXCISION/FULGURATION N/A 07/01/2018   Procedure: CONDYLOMA  REMOVAL;  Surgeon: Franchot Gallo, MD;  Location: [facility];  Service: Urology;  Laterality: N/A;   ESOPHAGOGASTRODUODENOSCOPY (EGD) WITH PROPOFOL N/A 03/21/2020   Procedure: ESOPHAGOGASTRODUODENOSCOPY (EGD) WITH PROPOFOL;  Surgeon: [name], MD;  [facility]: [facility];  Service: Endoscopy;  Laterality: N/A;   HEMOSTASIS CLIP PLACEMENT  03/21/2020   Procedure: HEMOSTASIS CLIP PLACEMENT;  Surgeon: [name], MD;  [facility]: [facility];  Service: Endoscopy;;   POLYPECTOMY  03/21/2020   Procedure: POLYPECTOMY;  Surgeon: [name], MD;  [facility]: [facility];  Service: Endoscopy;;    Family History  Problem Relation Age of Onset   Diabetes Mother    Hypertension Mother    Stroke Father    Colon polyps Neg Hx    Colon cancer Neg Hx    Esophageal cancer Neg Hx    Pancreatic cancer Neg Hx    Liver disease Neg Hx    Stomach cancer Neg Hx     Social History   Socioeconomic History   Marital status: Legally Separated    Spouse name: Not on file   Number of children: Not on file   Years of education: Not on file   Highest education level: Not on file  Occupational History   Not on file  Tobacco Use   Smoking status: Never   Smokeless tobacco:  Never  Vaping Use   Vaping Use: Never used  Substance and Sexual Activity   Alcohol use: Yes    Comment: occasional   Drug use: No   Sexual activity: Not Currently    Comment: pt given condoms  Other Topics Concern   Not on file  Social History Narrative   Not on file   Social Determinants of Health   Financial Resource Strain: Not on file  Food Insecurity: Not on file  Transportation Needs: Not on file  Physical Activity: Not on file  Stress: Not on file  Social Connections: Not on file  Intimate Partner Violence: Not on file    Outpatient Medications Prior to Visit  Medication Sig Dispense Refill   Blood Glucose Monitoring Suppl (CONTOUR NEXT EZ) w/Device KIT Use as directed to check  blood glucose levels 1 kit 0   darunavir (PREZISTA) 800 MG tablet Take 1 tablet (800 mg total) by mouth daily with breakfast. 30 tablet 11   elvitegravir-cobicistat-emtricitabine-tenofovir (GENVOYA) 150-150-200-10 MG TABS tablet Take 1 tablet by mouth daily with breakfast. 30 tablet 11   glucose blood (CONTOUR NEXT TEST) test strip Check blood sugar twice a day as instructed by provider. 100 each 3   Microlet Lancets MISC Use as directed 100 each 3   propranolol (INDERAL) 10 MG tablet Take 1 tablet  by mouth 3 times daily. 270 tablet 3   metFORMIN (GLUCOPHAGE) 500 MG tablet Take 1 tablet by mouth 2 times daily with a meal. 60 tablet 1   No facility-administered medications prior to visit.    No Known Allergies  ROS Pertinent positives and negatives in the history of present illness.     Objective:    Physical Exam Constitutional:      General: He is not in acute distress.    Appearance: He is not ill-appearing.  Eyes:     Conjunctiva/sclera: Conjunctivae normal.     Pupils: Pupils are equal, round, and reactive to light.  Cardiovascular:     Rate and Rhythm: Normal rate and regular rhythm.  Pulmonary:     Effort: Pulmonary effort is normal.     Breath sounds: Normal breath sounds.  Musculoskeletal:     Right lower leg: No edema.     Left lower leg: No edema.  Skin:    General: Skin is warm and dry.     Findings: Rash present.          Comments: Dry, scaling pruritic rash along belt line of abdomen. No significant erythema, induration, drainage or sign of infection.   Neurological:     General: No focal deficit present.     Mental Status: He is alert and oriented to person, place, and time.  Psychiatric:        Mood and Affect: Mood normal.        Behavior: Behavior normal.     BP 130/78 (BP Location: Left Arm, Patient Position: Sitting, Cuff Size: Large)   Pulse (!) 50   Temp 97.8 F (36.6 C) (Temporal)   Ht 6' (1.829 m)   Wt 236 lb (107 kg)   SpO2 98%   BMI  32.01 kg/m  Wt Readings from Last 3 Encounters:  07/30/21 236 lb (107 kg)  04/30/21 225 lb 9.6 oz (102.3 kg)  04/15/21 228 lb 9.6 oz (103.7 kg)     FBG is 107 in office  Hgb A1c 6.0%     Assessment & Plan:   Problem List Items Addressed This Visit  Digestive   Cirrhosis ([place])    Continue close follow up with GI       Relevant Orders   Comprehensive metabolic panel     Endocrine   New onset type 2 diabetes mellitus ([place]) - Primary    New diabetes approximately 4 months ago with an A1c of 13.2%. He was taking basal insulin initially but we were able to stop this at his last visit.  He has been taking metformin 500 mg twice daily.  Significant lifestyle changes. Hemoglobin A1c improved to 6.0% today. Reduce metformin to 500 mg once daily.  Add statin therapy. Urine microalbumin/creatinine ratio ordered as well as labs. Recommend diabetic eye exam. Follow-up in 6 weeks in office fasting.      Relevant Medications   rosuvastatin (CRESTOR) 5 MG tablet   metFORMIN (GLUCOPHAGE) 500 MG tablet   Other Relevant Orders   POCT Glucose (Device for Home Use) (Completed)   POCT HgB A1C (Completed)   CBC with Differential/Platelet   Comprehensive metabolic panel   TSH   Vitamin B12   Microalbumin / creatinine urine ratio     Musculoskeletal and Integument   Rash    Suspect he may have allergic dermatitis to his belt and belt buckle.  Recommend he avoid contact with his skin by wearing a shirt tucked then or not wearing the belt for several weeks. Also discussed possible fungal infection.  Prescribe topical antifungal and he may also try over-the-counter hydrocortisone topical for the next 4 weeks.  He will follow-up if worsening or we will recheck in 6 weeks to see how he is doing.  Advised to use topical steroids no more than 2 weeks at a time.      Relevant Medications   ketoconazole (NIZORAL) 2 % cream     Hematopoietic and Hemostatic   Thrombocytopenia (H[facility])    Relevant Orders   CBC with Differential/Platelet     Other   Human immunodeficiency virus (HIV) disease ([facility])    Continue ID follow up      Relevant Medications   ketoconazole (NIZORAL) 2 % cream   Pure hypercholesterolemia    Start on low dose Crestor today.discussed.  Discussed significance of hyperlipidemia with diabetes and the increased risk of atherosclerosis, heart disease and stroke.  He is willing to start on this medication. Recheck lipids and liver function in 6 weeks.       Relevant Medications   rosuvastatin (CRESTOR) 5 MG tablet   Other Relevant Orders   Lipid panel    I have changed [place]'s metFORMIN. I am also having him start on ketoconazole and rosuvastatin. Additionally, I am having him maintain his darunavir, Genvoya, propranolol, Contour Next Test, Microlet Lancets, and Contour Next EZ.  Meds ordered this encounter  Medications   ketoconazole (NIZORAL) 2 % cream    Sig: Apply 1 Application topically 2 (two) times daily.    Dispense:  60 g    Refill:  0    Order Specific Question:   Supervising Provider    Answer:   Pricilla Holm A [[id number]]   rosuvastatin (CRESTOR) 5 MG tablet    Sig: Take 1 tablet (5 mg total) by mouth daily.    Dispense:  90 tablet    Refill:  1    Order Specific Question:   Supervising Provider    Answer:   Pricilla Holm A [[id number]]   metFORMIN (GLUCOPHAGE) 500 MG tablet    Sig: Take 1 tablet (500 mg total) by mouth  daily with breakfast.    Dispense:  90 tablet    Refill:  1    Order Specific Question:   Supervising Provider    Answer:   Pricilla Holm A [[id number]]

## 2021-07-30 NOTE — Assessment & Plan Note (Signed)
Continue ID follow up

## 2021-07-30 NOTE — Assessment & Plan Note (Addendum)
Suspect he may have allergic dermatitis to his belt and belt buckle.  Recommend he avoid contact with his skin by wearing a shirt tucked then or not wearing the belt for several weeks. Also discussed possible fungal infection.  Prescribe topical antifungal and he may also try over-the-counter hydrocortisone topical for the next 4 weeks.  He will follow-up if worsening or we will recheck in 6 weeks to see how he is doing.  Advised to use topical steroids no more than 2 weeks at a time.

## 2021-07-30 NOTE — Telephone Encounter (Signed)
CRITICAL VALUE STICKER  CRITICAL VALUE: platelet count of 27  RECEIVER (on-site recipient of call): Jarrett Soho  DATE & TIME NOTIFIED: 07/30/21 at 10:32 am  MESSENGER (representative from lab): Santiago Glad  MD NOTIFIED: Harland Dingwall  TIME OF NOTIFICATION: 10:32 am

## 2021-07-30 NOTE — Assessment & Plan Note (Signed)
Continue close follow up with GI 

## 2021-08-01 ENCOUNTER — Other Ambulatory Visit (HOSPITAL_COMMUNITY): Payer: Self-pay

## 2021-08-01 ENCOUNTER — Telehealth: Payer: Self-pay | Admitting: Family Medicine

## 2021-08-01 NOTE — Telephone Encounter (Signed)
Pt has called and stated he was rx'd rosuvastatin (CRESTOR) and was reading the medications instructions and it states do not take if pt has liver disease or problems. Pt states he was informed he has to see a specialist for liver disease due to recent labs and wants to know if it is ok to this medication?

## 2021-08-01 NOTE — Telephone Encounter (Signed)
Pt is wanting to know if it is ok to start crestor since he has underlying disease?

## 2021-08-01 NOTE — Telephone Encounter (Signed)
After speaking with PCP, I called pt and let him know per Vickie, it is okay to take medication as benefits outweigh the risks. Pt verbalized understanding and states he will start it.

## 2021-08-12 ENCOUNTER — Telehealth: Payer: Self-pay

## 2021-08-12 NOTE — Telephone Encounter (Signed)
[name] with [facility] called to confirm patient's regimen. Per Jenny Reichmann patient received Prezista 600 mg 07/17/21, but he is not sure why it was entered in for 600 mg on pharmacy end.  I advised Jenny Reichmann that it was not sent in by our office and patient should be on Prezista '800mg'$  and Genvoya 10-150-200-'10mg'$ .  [name] verbalized understanding and he has deleted the Rx for prezista 600 mg out of their system.  Rosalba Totty T Brooks Sailors

## 2021-08-20 ENCOUNTER — Ambulatory Visit: Payer: PRIVATE HEALTH INSURANCE

## 2021-08-20 ENCOUNTER — Other Ambulatory Visit: Payer: Self-pay

## 2021-08-25 ENCOUNTER — Encounter: Payer: Self-pay | Admitting: Internal Medicine

## 2021-08-28 ENCOUNTER — Encounter: Payer: Self-pay | Admitting: Family Medicine

## 2021-08-28 ENCOUNTER — Ambulatory Visit (INDEPENDENT_AMBULATORY_CARE_PROVIDER_SITE_OTHER): Payer: PRIVATE HEALTH INSURANCE | Admitting: Family Medicine

## 2021-08-28 ENCOUNTER — Other Ambulatory Visit (HOSPITAL_COMMUNITY): Payer: Self-pay

## 2021-08-28 VITALS — BP 132/78 | HR 60 | Temp 97.6°F | Ht 72.0 in | Wt 233.0 lb

## 2021-08-28 DIAGNOSIS — N451 Epididymitis: Secondary | ICD-10-CM | POA: Diagnosis not present

## 2021-08-28 DIAGNOSIS — Z113 Encounter for screening for infections with a predominantly sexual mode of transmission: Secondary | ICD-10-CM | POA: Diagnosis not present

## 2021-08-28 DIAGNOSIS — R300 Dysuria: Secondary | ICD-10-CM | POA: Insufficient documentation

## 2021-08-28 DIAGNOSIS — E7800 Pure hypercholesterolemia, unspecified: Secondary | ICD-10-CM

## 2021-08-28 DIAGNOSIS — N50811 Right testicular pain: Secondary | ICD-10-CM | POA: Diagnosis not present

## 2021-08-28 DIAGNOSIS — N50812 Left testicular pain: Secondary | ICD-10-CM | POA: Diagnosis not present

## 2021-08-28 LAB — LIPID PANEL
Cholesterol: 161 mg/dL (ref 0–200)
HDL: 47.7 mg/dL (ref >39.00)
LDL Cholesterol: 99 mg/dL (ref 0–99)
NonHDL: 112.98
Total CHOL/HDL Ratio: 3
Triglycerides: 69 mg/dL (ref 0.0–149.0)
VLDL: 13.8 mg/dL (ref 0.0–40.0)

## 2021-08-28 LAB — POCT URINALYSIS DIPSTICK
Bilirubin, UA: NEGATIVE
Blood, UA: POSITIVE
Glucose, UA: NEGATIVE
Ketones, UA: NEGATIVE
Leukocytes, UA: NEGATIVE
Nitrite, UA: NEGATIVE
Protein, UA: NEGATIVE
Spec Grav, UA: 1.015 (ref 1.010–1.025)
Urobilinogen, UA: 1 E.U./dL
pH, UA: 6 (ref 5.0–8.0)

## 2021-08-28 LAB — ALT: ALT: 51 U/L (ref 0–53)

## 2021-08-28 MED ORDER — DOXYCYCLINE HYCLATE 100 MG PO TABS
100.0000 mg | ORAL_TABLET | Freq: Two times a day (BID) | ORAL | 0 refills | Status: DC
  Filled 2021-08-28: qty 20, 10d supply, fill #0

## 2021-08-28 MED ORDER — CEFTRIAXONE SODIUM 500 MG IJ SOLR
500.0000 mg | Freq: Once | INTRAMUSCULAR | Status: AC
  Administered 2021-08-28: 500 mg via INTRAMUSCULAR

## 2021-08-28 NOTE — Assessment & Plan Note (Signed)
Check fasting lipids 

## 2021-08-28 NOTE — Progress Notes (Signed)
Subjective:     Patient ID: Robert Ware, male    DOB: 19-Apr-1965, 56 y.o.   MRN: [id number]  Chief Complaint  Patient presents with   Penis Pain    Dysuria and testicle pain for about a week now. Received oral sex last week     HPI Patient is in today for a 1 wk hx of dysuria and recent unprotected sex. States he received oral sex and since then he has had painful urination and testicle pain.   No fever, chills, abdominal pain, back pain, N/V/D.   Reports taking statin daily without any concerns x 4 wks  Called GI to follow up on cirrhosis.   Health Maintenance Due  Topic Date Due   FOOT EXAM  Never done   OPHTHALMOLOGY EXAM  Never done   Zoster Vaccines- Shingrix (1 of 2) Never done   TETANUS/TDAP  04/12/2016   COVID-19 Vaccine (2 - Pfizer risk series) 12/12/2020   INFLUENZA VACCINE  08/26/2021    Past Medical History:  Diagnosis Date   Cirrhosis ([place])    PT DENIES   Condyloma    ED (erectile dysfunction)    Elevated liver enzymes    GERD (gastroesophageal reflux disease)    HIV (human immunodeficiency virus infection) ([place])    Seborrhea    Splenomegaly    Thrombocytopenia ([place])    Warts     Past Surgical History:  Procedure Laterality Date   BIOPSY  03/21/2020   Procedure: BIOPSY;  Surgeon: [name], MD;  Location: [facility];  Service: Endoscopy;;   COLONOSCOPY WITH PROPOFOL N/A 03/21/2020   Procedure: COLONOSCOPY WITH PROPOFOL;  Surgeon: [name], MD;  Location: [facility];  Service: Endoscopy;  Laterality: N/A;   CONDYLOMA EXCISION/FULGURATION  2007   CONDYLOMA EXCISION/FULGURATION N/A 07/01/2018   Procedure: CONDYLOMA REMOVAL;  Surgeon: Franchot Gallo, MD;  Location: [facility];  Service: Urology;  Laterality: N/A;   ESOPHAGOGASTRODUODENOSCOPY (EGD) WITH PROPOFOL N/A 03/21/2020   Procedure: ESOPHAGOGASTRODUODENOSCOPY (EGD) WITH PROPOFOL;  Surgeon: [name], MD;  Location: [facility];  Service: Endoscopy;   Laterality: N/A;   HEMOSTASIS CLIP PLACEMENT  03/21/2020   Procedure: HEMOSTASIS CLIP PLACEMENT;  Surgeon: [name], MD;  [facility]: [facility];  Service: Endoscopy;;   POLYPECTOMY  03/21/2020   Procedure: POLYPECTOMY;  Surgeon: [name], MD;  [facility];  Service: Endoscopy;;    Family History  Problem Relation Age of Onset   Diabetes Mother    Hypertension Mother    Stroke Father    Colon polyps Neg Hx    Colon cancer Neg Hx    Esophageal cancer Neg Hx    Pancreatic cancer Neg Hx    Liver disease Neg Hx    Stomach cancer Neg Hx     Social History   Socioeconomic History   Marital status: Legally Separated    Spouse name: Not on file   Number of children: Not on file   Years of education: Not on file   Highest education level: Not on file  Occupational History   Not on file  Tobacco Use   Smoking status: Never   Smokeless tobacco: Never  Vaping Use   Vaping Use: Never used  Substance and Sexual Activity   Alcohol use: Yes    Comment: occasional   Drug use: No   Sexual activity: Not Currently    Comment: pt given condoms  Other Topics Concern   Not on file  Social History Narrative   Not on file   Social Determinants of Health   Financial Resource Strain: Not on file  Food Insecurity: Not on file  Transportation Needs: Not on file  Physical Activity: Not on file  Stress: Not on file  Social Connections: Not on file  Intimate Partner Violence: Not on file    Outpatient Medications Prior to Visit  Medication Sig Dispense Refill   Blood Glucose Monitoring Suppl (CONTOUR NEXT EZ) w/Device KIT Use as directed to check blood glucose levels 1 kit 0   darunavir (PREZISTA) 800 MG tablet Take 1 tablet (800 mg total) by mouth daily with breakfast. 30 tablet 11   elvitegravir-cobicistat-emtricitabine-tenofovir (GENVOYA) 150-150-200-10 MG TABS tablet Take 1 tablet by mouth daily with breakfast. 30 tablet 11   glucose blood (CONTOUR NEXT  TEST) test strip Check blood sugar twice a day as instructed by provider. 100 each 3   ketoconazole (NIZORAL) 2 % cream Apply topically 2 times daily. 60 g 0   metFORMIN (GLUCOPHAGE) 500 MG tablet Take 1 tablet (500 mg total) by mouth daily with breakfast. 90 tablet 1   Microlet Lancets MISC Use as directed 100 each 3   propranolol (INDERAL) 10 MG tablet Take 1 tablet  by mouth 3 times daily. 270 tablet 3   rosuvastatin (CRESTOR) 5 MG tablet Take 1 tablet (5 mg total) by mouth daily. 90 tablet 1   No facility-administered medications prior to visit.    No Known Allergies  ROS     Objective:    Physical Exam Exam conducted with a chaperone present.  Constitutional:      General: He is not in acute distress.    Appearance: He is not ill-appearing.  Cardiovascular:     Rate and Rhythm: Normal rate.  Pulmonary:     Effort: Pulmonary effort is normal.  Genitourinary:    Penis: Normal. No discharge or lesions.      Testes:        Right: Tenderness present.     Epididymis:     Right: Enlarged. Tenderness present.     Left: Normal.  Lymphadenopathy:     Lower Body: No right inguinal adenopathy. No left inguinal adenopathy.  Skin:    General: Skin is warm and dry.  Neurological:     General: No focal deficit present.     Mental Status: He is alert and oriented to person, place, and time.  Psychiatric:        Mood and Affect: Mood normal.        Behavior: Behavior normal.     BP 132/78 (BP Location: Left Arm, Patient Position: Sitting, Cuff Size: Large)   Pulse 60   Temp 97.6 F (36.4 C) (Temporal)   Ht 6' (1.829 m)   Wt 233 lb (105.7 kg)   SpO2 98%   BMI 31.60 kg/m  Wt Readings from Last 3 Encounters:  08/28/21 233 lb (105.7 kg)  07/30/21 236 lb (107 kg)  04/30/21 225 lb 9.6 oz (102.3 kg)       Assessment & Plan:   Problem List Items Addressed This Visit       Genitourinary   Epididymitis, right - Primary    Ceftriaxone 500 mg IM in office. Doxycycline  prescribed. Urine culture sent. Testing for STDs done. Follow up in 10-12 days or sooner if he is getting worse.       Relevant Medications   doxycycline (VIBRA-TABS) 100 MG tablet   Other Relevant Orders  GC/Chlamydia Probe Amp     Other   Dysuria   Relevant Orders   RPR   GC/Chlamydia Probe Amp   POCT urinalysis dipstick (Completed)   Pain in both testicles   Relevant Orders   RPR   GC/Chlamydia Probe Amp   POCT urinalysis dipstick (Completed)   Pure hypercholesterolemia    Check fasting lipids       Relevant Orders   Lipid panel (Completed)   ALT (Completed)   Screen for STD (sexually transmitted disease)    He is HIV+ followed by ID. Recent sexual activity. Screen for RPR, GC/CT. Treat with ceftriaxone IM in office and start on Doxycycline PO.       Relevant Orders   RPR   GC/Chlamydia Probe Amp    I am having Xavyer Hogue start on doxycycline. I am also having him maintain his darunavir, Genvoya, propranolol, Contour Next Test, Microlet Lancets, Contour Next EZ, ketoconazole, rosuvastatin, and metFORMIN. We administered cefTRIAXone.  Meds ordered this encounter  Medications   doxycycline (VIBRA-TABS) 100 MG tablet    Sig: Take 1 tablet (100 mg total) by mouth 2 (two) times daily.    Dispense:  20 tablet    Refill:  0    Order Specific Question:   Supervising Provider    Answer:   Pricilla Holm A [[id number]]   cefTRIAXone (ROCEPHIN) injection 500 mg

## 2021-08-28 NOTE — Patient Instructions (Addendum)
Please go to the lab on the first floor before you leave.   Avoid sexual activity until you follow up with me.   Take the antibiotic, Doxycycline, as prescribed for the next 10 days. AVOID sun exposure while taking this medication.   Follow up with me in 10-12 days or sooner if you are having any worsening pain or new symptoms.

## 2021-08-28 NOTE — Assessment & Plan Note (Signed)
Ceftriaxone 500 mg IM in office. Doxycycline prescribed. Urine culture sent. Testing for STDs done. Follow up in 10-12 days or sooner if he is getting worse.

## 2021-08-28 NOTE — Assessment & Plan Note (Signed)
He is HIV+ followed by ID. Recent sexual activity. Screen for RPR, GC/CT. Treat with ceftriaxone IM in office and start on Doxycycline PO.

## 2021-08-29 LAB — RPR: RPR Ser Ql: NONREACTIVE

## 2021-09-01 LAB — GC/CHLAMYDIA PROBE AMP
Chlamydia trachomatis, NAA: NEGATIVE
Neisseria Gonorrhoeae by PCR: NEGATIVE

## 2021-09-09 ENCOUNTER — Other Ambulatory Visit: Payer: Self-pay

## 2021-09-09 DIAGNOSIS — K7460 Unspecified cirrhosis of liver: Secondary | ICD-10-CM

## 2021-09-10 ENCOUNTER — Ambulatory Visit: Payer: PRIVATE HEALTH INSURANCE | Admitting: Family Medicine

## 2021-10-16 ENCOUNTER — Ambulatory Visit (HOSPITAL_COMMUNITY): Admission: RE | Admit: 2021-10-16 | Payer: PRIVATE HEALTH INSURANCE | Source: Ambulatory Visit

## 2021-10-23 ENCOUNTER — Ambulatory Visit: Payer: PRIVATE HEALTH INSURANCE | Admitting: Gastroenterology

## 2021-10-31 ENCOUNTER — Other Ambulatory Visit: Payer: Self-pay | Admitting: Pharmacist

## 2021-10-31 ENCOUNTER — Other Ambulatory Visit: Payer: Self-pay

## 2021-10-31 DIAGNOSIS — B20 Human immunodeficiency virus [HIV] disease: Secondary | ICD-10-CM

## 2021-10-31 MED ORDER — DARUNAVIR 800 MG PO TABS: 800.0000 mg | ORAL_TABLET | Freq: Every day | ORAL | 11 refills | Status: DC

## 2021-10-31 MED ORDER — GENVOYA 150-150-200-10 MG PO TABS: 1.0000 | ORAL_TABLET | Freq: Every day | ORAL | 11 refills | Status: DC

## 2021-11-03 ENCOUNTER — Telehealth: Payer: Self-pay | Admitting: Internal Medicine

## 2021-11-03 ENCOUNTER — Other Ambulatory Visit (HOSPITAL_COMMUNITY): Payer: Self-pay

## 2021-11-03 NOTE — Telephone Encounter (Signed)
Pt requested to speak with Dr.[name]'s nurse regarding his medication. Pt states he did called the [facility] on file. He can be reached at [phone]

## 2021-11-03 NOTE — Telephone Encounter (Signed)
Spoke to [facility] - they are getting medications ready for patient now. Informed patient he verbalized his understanding.   [place], CMA

## 2021-11-04 ENCOUNTER — Other Ambulatory Visit: Payer: Self-pay

## 2021-11-04 ENCOUNTER — Other Ambulatory Visit: Payer: PRIVATE HEALTH INSURANCE

## 2021-11-04 DIAGNOSIS — B20 Human immunodeficiency virus [HIV] disease: Secondary | ICD-10-CM

## 2021-11-05 LAB — T-HELPER CELL (CD4) - (RCID CLINIC ONLY)
CD4 % Helper T Cell: 39 % (ref 33–65)
CD4 T Cell Abs: 332 /uL — ABNORMAL LOW (ref 400–1790)

## 2021-11-07 LAB — COMPREHENSIVE METABOLIC PANEL
AG Ratio: 0.9 (calc) — ABNORMAL LOW (ref 1.0–2.5)
ALT: 48 U/L — ABNORMAL HIGH (ref 9–46)
AST: 59 U/L — ABNORMAL HIGH (ref 10–35)
Albumin: 3.5 g/dL — ABNORMAL LOW (ref 3.6–5.1)
Alkaline phosphatase (APISO): 100 U/L (ref 35–144)
BUN: 9 mg/dL (ref 7–25)
CO2: 25 mmol/L (ref 20–32)
Calcium: 8.9 mg/dL (ref 8.6–10.3)
Chloride: 107 mmol/L (ref 98–110)
Creat: 0.73 mg/dL (ref 0.70–1.30)
Globulin: 3.9 g/dL (calc) — ABNORMAL HIGH (ref 1.9–3.7)
Glucose, Bld: 123 mg/dL — ABNORMAL HIGH (ref 65–99)
Potassium: 4.6 mmol/L (ref 3.5–5.3)
Sodium: 137 mmol/L (ref 135–146)
Total Bilirubin: 0.8 mg/dL (ref 0.2–1.2)
Total Protein: 7.4 g/dL (ref 6.1–8.1)

## 2021-11-07 LAB — CBC
HCT: 46.2 % (ref 38.5–50.0)
Hemoglobin: 15.7 g/dL (ref 13.2–17.1)
MCH: 28.6 pg (ref 27.0–33.0)
MCHC: 34 g/dL (ref 32.0–36.0)
MCV: 84.2 fL (ref 80.0–100.0)
MPV: 10.8 fL (ref 7.5–12.5)
Platelets: 46 10*3/uL — ABNORMAL LOW (ref 140–400)
RBC: 5.49 10*6/uL (ref 4.20–5.80)
RDW: 17.2 % — ABNORMAL HIGH (ref 11.0–15.0)
WBC: 2.5 10*3/uL — ABNORMAL LOW (ref 3.8–10.8)

## 2021-11-07 LAB — LIPID PANEL
Cholesterol: 189 mg/dL (ref <200)
HDL: 64 mg/dL (ref >=40)
LDL Cholesterol (Calc): 108 mg/dL (calc) — ABNORMAL HIGH
Non-HDL Cholesterol (Calc): 125 mg/dL (calc) (ref <130)
Total CHOL/HDL Ratio: 3 (calc) (ref <5.0)
Triglycerides: 79 mg/dL (ref <150)

## 2021-11-07 LAB — RPR: RPR Ser Ql: NONREACTIVE

## 2021-11-07 LAB — HIV-1 RNA QUANT-NO REFLEX-BLD
HIV 1 RNA Quant: <20 Copies/mL — ABNORMAL HIGH
HIV-1 RNA Quant, Log: <1.3 Log cps/mL — ABNORMAL HIGH

## 2021-11-21 ENCOUNTER — Other Ambulatory Visit (HOSPITAL_COMMUNITY): Payer: Self-pay

## 2021-11-26 ENCOUNTER — Other Ambulatory Visit: Payer: Self-pay

## 2021-11-26 ENCOUNTER — Encounter: Payer: Self-pay | Admitting: Internal Medicine

## 2021-11-26 ENCOUNTER — Ambulatory Visit (INDEPENDENT_AMBULATORY_CARE_PROVIDER_SITE_OTHER): Payer: PRIVATE HEALTH INSURANCE | Admitting: Internal Medicine

## 2021-11-26 VITALS — BP 152/92 | HR 58 | Temp 97.4°F | Ht 71.0 in

## 2021-11-26 DIAGNOSIS — B20 Human immunodeficiency virus [HIV] disease: Secondary | ICD-10-CM | POA: Diagnosis not present

## 2021-11-26 DIAGNOSIS — K7469 Other cirrhosis of liver: Secondary | ICD-10-CM | POA: Diagnosis not present

## 2021-11-26 DIAGNOSIS — Z23 Encounter for immunization: Secondary | ICD-10-CM | POA: Diagnosis not present

## 2021-11-26 DIAGNOSIS — N451 Epididymitis: Secondary | ICD-10-CM | POA: Diagnosis not present

## 2021-11-26 NOTE — Assessment & Plan Note (Signed)
He has chronic cirrhosis of unknown cause.  He has had chronic stable thrombocytopenia but no other obvious complications of his cirrhosis.

## 2021-11-26 NOTE — Assessment & Plan Note (Signed)
He was treated with empiric ceftriaxone and doxycycline in August for presumed right-sided epididymitis.  His symptoms resolved promptly.  UA revealed red blood cells but no other abnormalities.  Screening for GC and chlamydia was negative.

## 2021-11-26 NOTE — Assessment & Plan Note (Signed)
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  He will continue Genvoya and Prezista and follow-up after lab work in 1 year.

## 2021-11-26 NOTE — Progress Notes (Signed)
Patient Active Problem List   Diagnosis Date Noted   COVID 09/05/2020    Priority: High   Human immunodeficiency virus (HIV) disease ([place]) 02/06/2006    Priority: High   Cirrhosis ([place]) 02/25/2017    Priority: Medium    Splenomegaly 02/25/2017    Priority: Medium    Elevated liver enzymes 06/14/2014    Priority: Medium    Thrombocytopenia ([place]) 02/06/2006    Priority: Medium    Dysuria 08/28/2021   Pain in both testicles 08/28/2021   Screen for STD (sexually transmitted disease) 08/28/2021   Epididymitis, right 08/28/2021   Pure hypercholesterolemia 07/30/2021   New onset type 2 diabetes mellitus ([place]) 07/30/2021   Obesity (BMI 30.0-34.9) 04/15/2021   H. pylori infection 11/20/2020   Rash 11/22/2019   SEBORRHEA 04/17/2008   Genital warts 02/06/2006   ERECTILE DYSFUNCTION 02/06/2006   GERD 02/06/2006    Patient's Medications  New Prescriptions   No medications on file  Previous Medications   BLOOD GLUCOSE MONITORING SUPPL (CONTOUR NEXT EZ) W/DEVICE KIT    Use as directed to check blood glucose levels   DARUNAVIR (PREZISTA) 800 MG TABLET    Take 1 tablet (800 mg total) by mouth daily with breakfast.   DOXYCYCLINE (VIBRA-TABS) 100 MG TABLET    Take 1 tablet (100 mg total) by mouth 2 (two) times daily.   ELVITEGRAVIR-COBICISTAT-EMTRICITABINE-TENOFOVIR (GENVOYA) 150-150-200-10 MG TABS TABLET    Take 1 tablet by mouth daily with breakfast.   GLUCOSE BLOOD (CONTOUR NEXT TEST) TEST STRIP    Check blood sugar twice a day as instructed by provider.   KETOCONAZOLE (NIZORAL) 2 % CREAM    Apply topically 2 times daily.   METFORMIN (GLUCOPHAGE) 500 MG TABLET    Take 1 tablet (500 mg total) by mouth daily with breakfast.   MICROLET LANCETS MISC    Use as directed   PROPRANOLOL (INDERAL) 10 MG TABLET    Take 1 tablet  by mouth 3 times daily.   ROSUVASTATIN (CRESTOR) 5 MG TABLET    Take 1 tablet (5 mg total) by mouth daily.  Modified Medications   No medications on file   Discontinued Medications   No medications on file    Subjective: Robert Ware is in for his routine HIV follow-up visit.  Robert Ware has been getting his [place] and Prezista with health insurance purchased off of the exchange.  Apparently there was some problem and Robert Ware was unable to get his medications for about 2 weeks.  Robert Ware met with our financial counselor here and has now been able to restart Genvoya and [place].  Robert Ware says that Robert Ware does not recall missing doses when Robert Ware was able to get it from the pharmacy.  Review of Systems: Review of Systems  Constitutional:  Negative for fever.    Past Medical History:  Diagnosis Date   Cirrhosis ([place])    PT DENIES   Condyloma    ED (erectile dysfunction)    Elevated liver enzymes    GERD (gastroesophageal reflux disease)    HIV (human immunodeficiency virus infection) ([place])    Seborrhea    Splenomegaly    Thrombocytopenia ([facility])    Warts     Social History   Tobacco Use   Smoking status: Never   Smokeless tobacco: Never  Vaping Use   Vaping Use: Never used  Substance Use Topics   Alcohol use: Not Currently    Comment: occasional   Drug use: No  Family History  Problem Relation Age of Onset   Diabetes Mother    Hypertension Mother    Stroke Father    Colon polyps Neg Hx    Colon cancer Neg Hx    Esophageal cancer Neg Hx    Pancreatic cancer Neg Hx    Liver disease Neg Hx    Stomach cancer Neg Hx     No Known Allergies  Health Maintenance  Topic Date Due   FOOT EXAM  Never done   OPHTHALMOLOGY EXAM  Never done   Zoster Vaccines- Shingrix (1 of 2) Never done   TETANUS/TDAP  04/12/2016   COVID-19 Vaccine (2 - Pfizer risk series) 12/12/2020   HEMOGLOBIN A1C  01/30/2022   Diabetic kidney evaluation - Urine ACR  07/31/2022   Diabetic kidney evaluation - GFR measurement  11/05/2022   COLONOSCOPY (Pts 45-75yr Insurance coverage will need to be confirmed)  03/21/2030   INFLUENZA VACCINE  Completed   Hepatitis C Screening   Completed   HIV Screening  Completed   HPV VACCINES  Aged Out    Objective:  Vitals:   11/26/21 0911 11/26/21 0924  BP: (!) 149/93 (!) 152/92  Pulse: (!) 58   Temp: (!) 97.4 F (36.3 C)   TempSrc: Oral   SpO2: 97%   Height: _0  (1.803 m)    Body mass index is 32.5 kg/m.  Physical Exam Constitutional:      Comments: Robert Ware is very pleasant as always.  Cardiovascular:     Rate and Rhythm: Normal rate and regular rhythm.     Heart sounds: No murmur heard. Pulmonary:     Effort: Pulmonary effort is normal.     Breath sounds: Normal breath sounds.  Psychiatric:        Mood and Affect: Mood normal.     Lab Results Lab Results  Component Value Date   WBC 2.5 (L) 11/04/2021   HGB 15.7 11/04/2021   HCT 46.2 11/04/2021   MCV 84.2 11/04/2021   PLT 46 (L) 11/04/2021    Lab Results  Component Value Date   CREATININE 0.73 11/04/2021   BUN 9 11/04/2021   NA 137 11/04/2021   K 4.6 11/04/2021   CL 107 11/04/2021   CO2 25 11/04/2021    Lab Results  Component Value Date   ALT 48 (H) 11/04/2021   AST 59 (H) 11/04/2021   ALKPHOS 59 07/30/2021   BILITOT 0.8 11/04/2021    Lab Results  Component Value Date   CHOL 189 11/04/2021   HDL 64 11/04/2021   LDLCALC 108 (H) 11/04/2021   LDLDIRECT 76.0 04/15/2021   TRIG 79 11/04/2021   CHOLHDL 3.0 11/04/2021   Lab Results  Component Value Date   LABRPR NON-REACTIVE 11/04/2021   HIV 1 RNA Quant (Copies/mL)  Date Value  11/04/2021 <20 (H)  11/07/2020 Not Detected  11/10/2019 <20 (H)   CD4 T Cell Abs (/uL)  Date Value  11/04/2021 332 (L)  11/07/2020 316 (L)  11/10/2019 210 (L)     Problem List Items Addressed This Visit       High   Human immunodeficiency virus (HIV) disease ([place]    His infection has been under excellent, long-term control.  Robert Ware will continue Genvoya and Prezista and follow-up after lab work in 1 year.      Relevant Orders   CBC   [facility]   Comprehensive  metabolic panel   Lipid panel   RPR  HIV-1 RNA quant-no reflex-bld     Medium    Cirrhosis ([place])    Robert Ware has chronic cirrhosis of unknown cause.  Robert Ware has had chronic stable thrombocytopenia but no other obvious complications of his cirrhosis.        Unprioritized   Epididymitis, right    Robert Ware was treated with empiric ceftriaxone and doxycycline in August for presumed right-sided epididymitis.  His symptoms resolved promptly.  UA revealed red blood cells but no other abnormalities.  Screening for GC and chlamydia was negative.      Other Visit Diagnoses     Need for immunization against influenza    -  Primary   Relevant Orders   Flu Vaccine QUAD 64moIM (Fluarix, Fluzone & Alfiuria Quad PF) (Completed)         JMichel Bickers MD [id number] [phone]   [phone] 11/26/2021, 11:03 AM

## 2021-12-29 IMAGING — US US ABDOMEN LIMITED
1 series · 15 of 25 positions shown · non-contrast
Comparison: 02/26/2020

CLINICAL DATA: Hepatocellular carcinoma screening. History of
cirrhosis.

EXAM:
ULTRASOUND ABDOMEN LIMITED RIGHT UPPER QUADRANT

[Series 1: us abdomen limited ruq mc & wl · 15 of 46 slices shown]
[im 1/46]
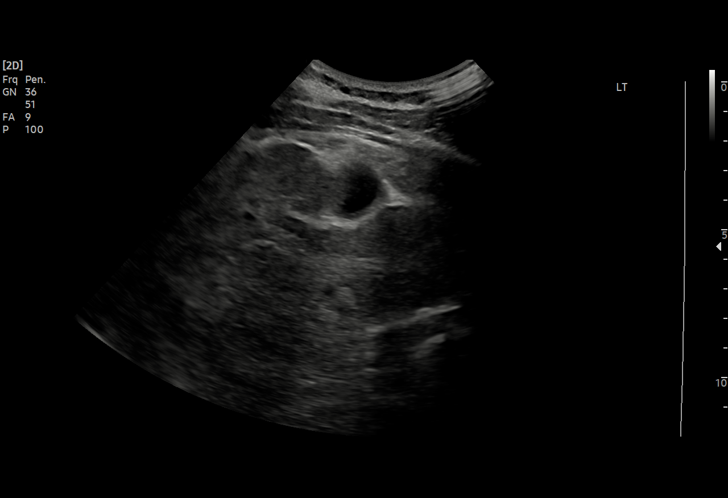
[im 4/46]
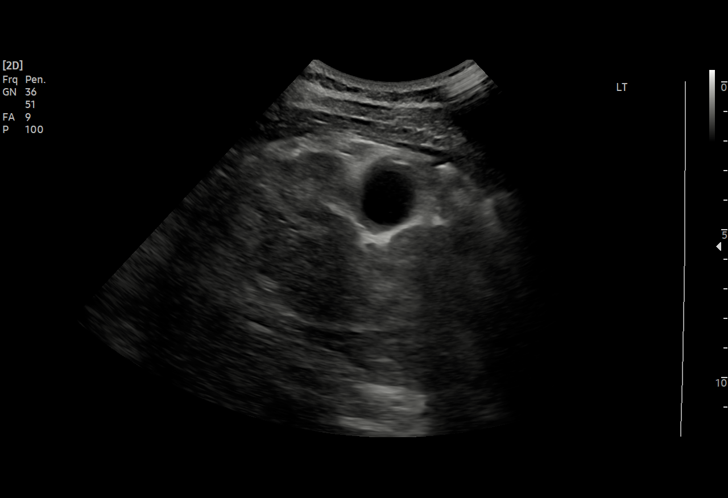
[im 8/46]
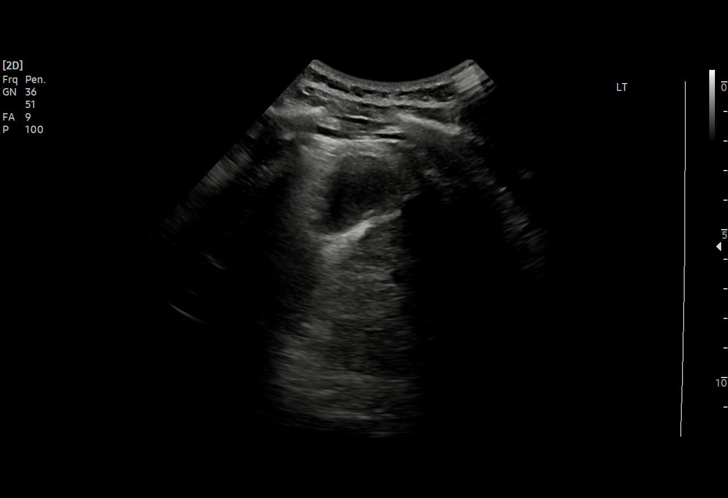
[im 10/46]
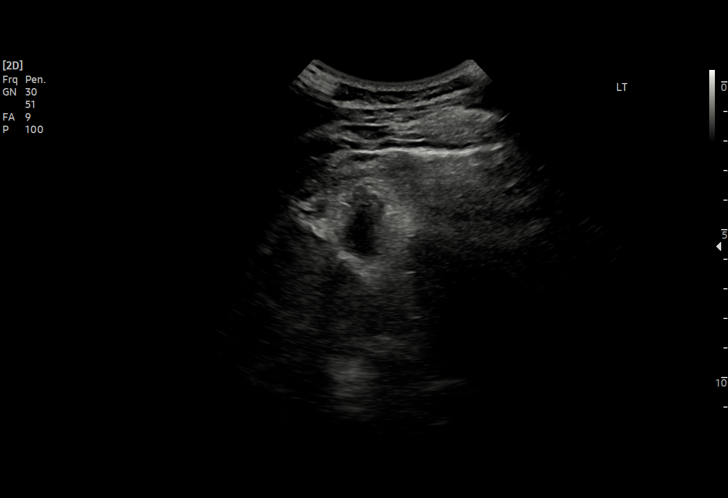
[im 14/46]
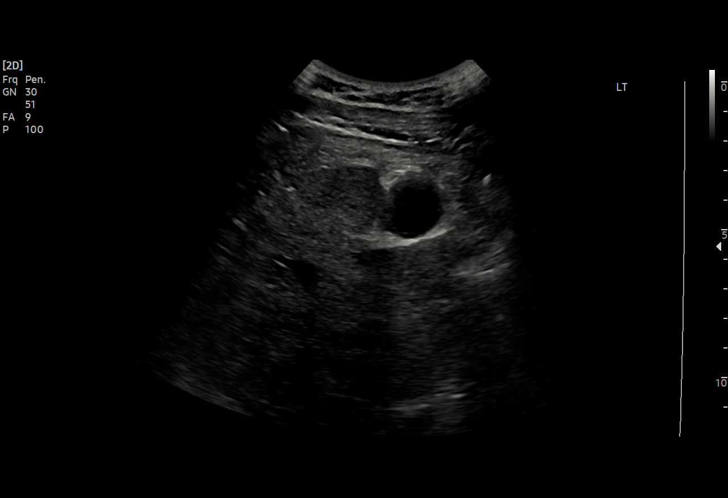
[im 17/46]
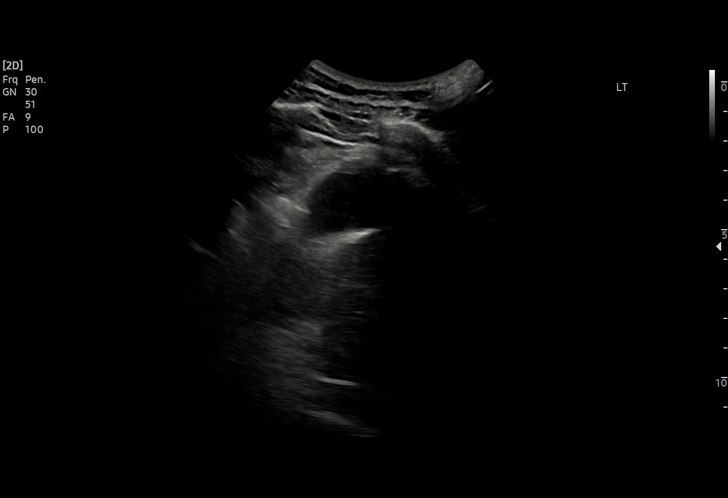
[im 19/46]
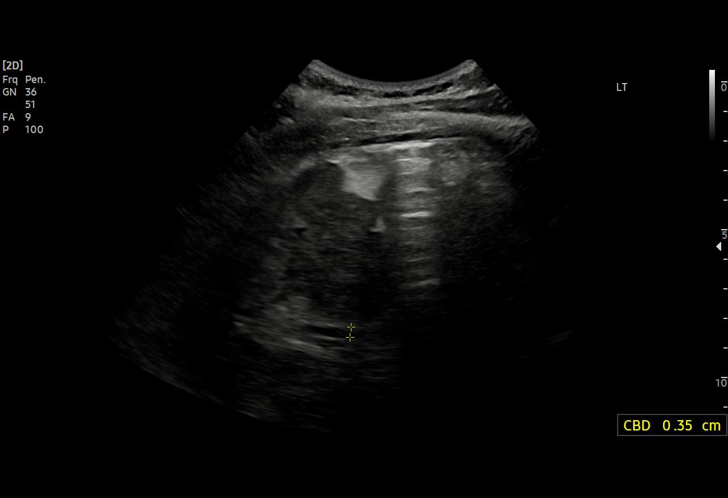
[im 23/46]
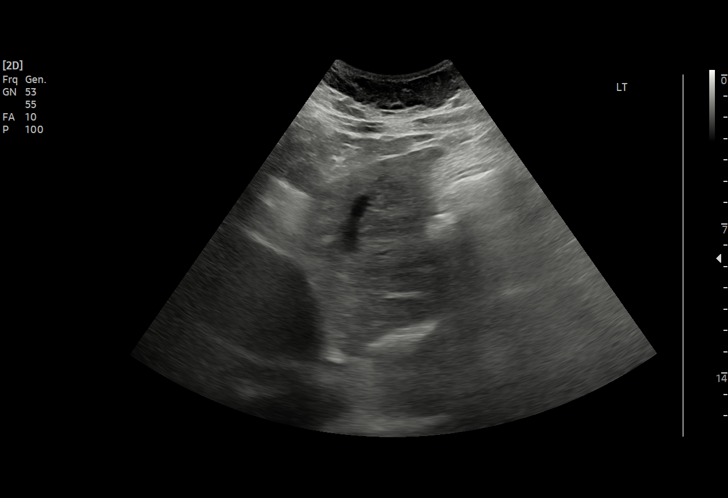
[im 27/46]
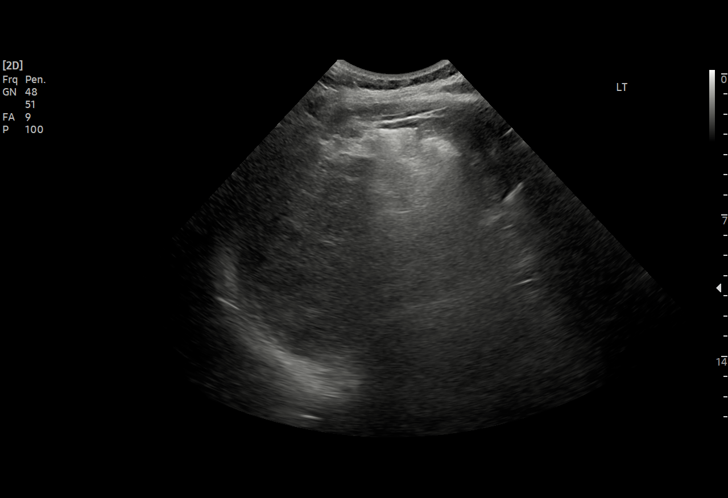
[im 29/46]
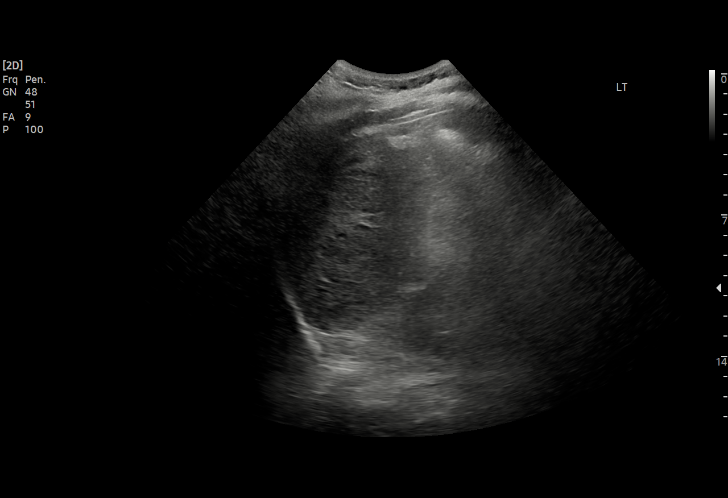
[im 32/46]
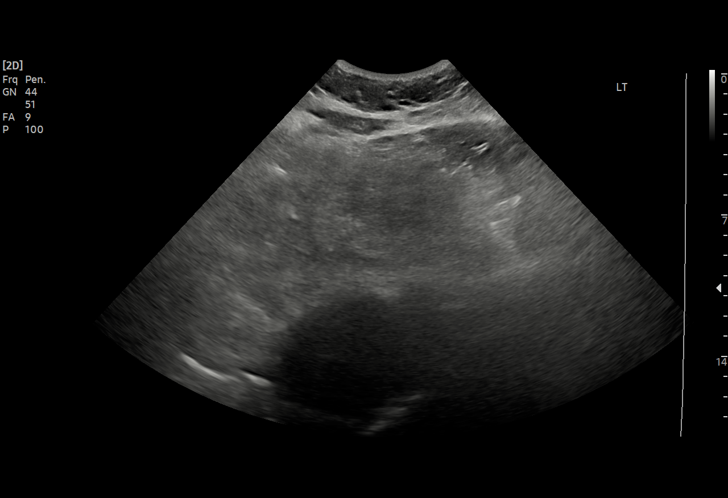
[im 36/46]
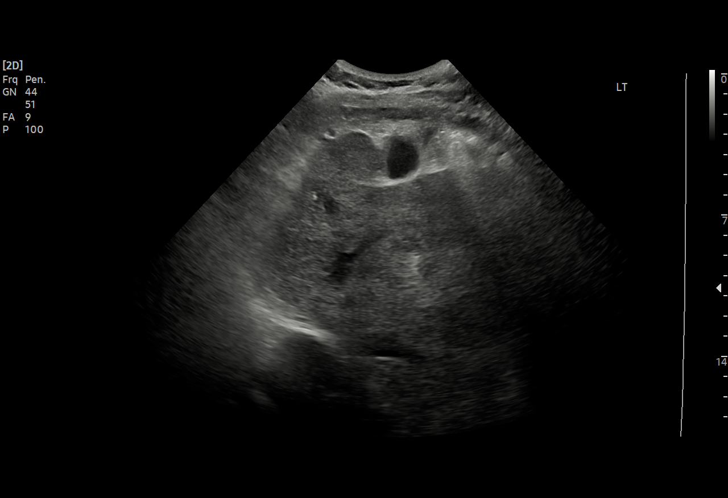
[im 38/46]
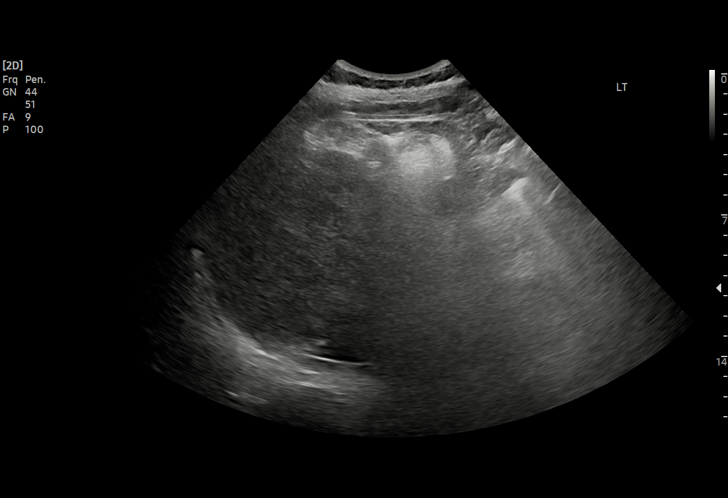
[im 42/46]
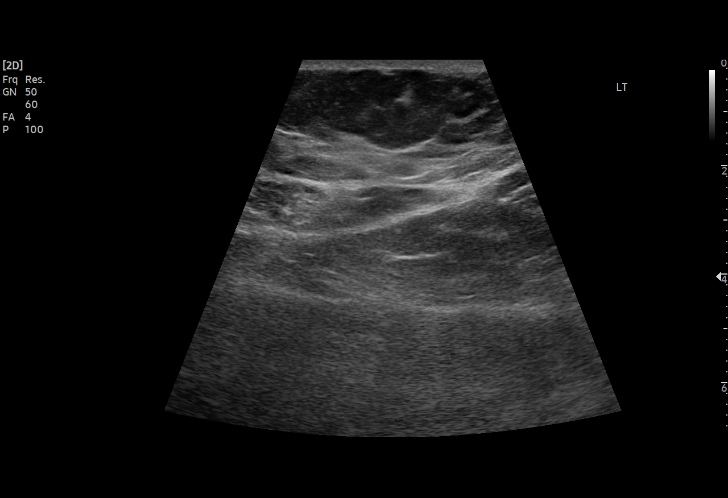
[im 46/46]
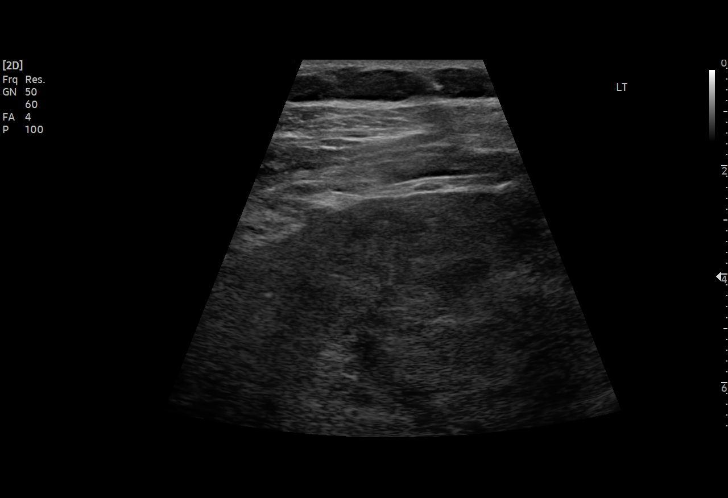

[15 of 25 positions shown; findings below may reference images not displayed]

FINDINGS: Gallbladder:

Gallbladder is mildly contracted. Borderline wall thickening at 3
mm. No gallstones or pericholecystic fluid. Sonographic Murphy's
sign was not elicited.

Common bile duct:

Diameter: Normal, 4 mm.

Liver:

Heterogeneous hepatic capsule. Heterogeneous hepatic echotexture
throughout. Given below limitations, no focal lesion identified.
Portal vein is patent on color Doppler imaging with normal direction
of blood flow towards the liver.

Other: Mild limitations secondary to overlying bowel gas.
IMPRESSION: Coarsened hepatic echotexture and irregular capsule, consistent with
cirrhosis. Given above mild limitations, no evidence of
hepatocellular carcinoma.

Partially contracted gallbladder, despite patient being NPO.
Correlate with any symptoms to suggest chronic cholecystitis.

## 2022-01-08 ENCOUNTER — Ambulatory Visit (INDEPENDENT_AMBULATORY_CARE_PROVIDER_SITE_OTHER): Payer: PRIVATE HEALTH INSURANCE | Admitting: Internal Medicine

## 2022-01-08 ENCOUNTER — Encounter: Payer: Self-pay | Admitting: Internal Medicine

## 2022-01-08 ENCOUNTER — Other Ambulatory Visit (HOSPITAL_COMMUNITY): Payer: Self-pay

## 2022-01-08 VITALS — BP 134/88 | HR 65 | Ht 71.0 in | Wt 235.0 lb

## 2022-01-08 DIAGNOSIS — G63 Polyneuropathy in diseases classified elsewhere: Secondary | ICD-10-CM

## 2022-01-08 DIAGNOSIS — E785 Hyperlipidemia, unspecified: Secondary | ICD-10-CM

## 2022-01-08 DIAGNOSIS — E119 Type 2 diabetes mellitus without complications: Secondary | ICD-10-CM | POA: Insufficient documentation

## 2022-01-08 DIAGNOSIS — E1142 Type 2 diabetes mellitus with diabetic polyneuropathy: Secondary | ICD-10-CM | POA: Insufficient documentation

## 2022-01-08 LAB — POCT GLYCOSYLATED HEMOGLOBIN (HGB A1C): Hemoglobin A1C: 5.8 % — AB (ref 4.0–5.6)

## 2022-01-08 LAB — POCT GLUCOSE (DEVICE FOR HOME USE): POC Glucose: 138 mg/dl — AB (ref 70–99)

## 2022-01-08 MED ORDER — METFORMIN HCL ER 500 MG PO TB24: 500.0000 mg | ORAL_TABLET | Freq: Every day | ORAL | 3 refills | Status: DC

## 2022-01-08 MED ORDER — METFORMIN HCL 500 MG PO TABS: 500.0000 mg | ORAL_TABLET | Freq: Every day | ORAL | 3 refills | Status: DC

## 2022-01-08 MED ORDER — METFORMIN HCL ER 500 MG PO TB24
500.0000 mg | ORAL_TABLET | Freq: Every day | ORAL | 3 refills | Status: DC
  Filled 2022-01-08 – 2022-03-09 (×2): qty 90, 90d supply, fill #0
  Filled 2022-06-23 – 2022-06-24 (×4): qty 90, 90d supply, fill #1

## 2022-01-08 NOTE — Progress Notes (Signed)
Name: Robert Ware  MRN/ DOB: [id number], 07/24/1965   Age/ Sex: 56 y.o., male    PCP: Girtha Rm, NP-C   Reason for Endocrinology Evaluation: Type 2 Diabetes Mellitus     Date of Initial Endocrinology Visit: 01/08/2022     PATIENT IDENTIFIER: Mr. Darvin Dials is a 56 y.o. male with a past medical history of DM, HIV, splenomegaly, dyslipidemia, liver cirrhosis. The patient presented for initial endocrinology clinic visit on 01/08/2022 for consultative assistance with his diabetes management.    HPI: Mr. Dapolito was    Diagnosed with DM in 03/2021 Prior Medications tried/Intolerance: n/a Currently checking blood sugars 2 x / day Hypoglycemia episodes : no             Hemoglobin A1c has ranged from 6.0% in 07/2021, peaking at 13.2% in 03/2021.   In terms of diet, the patient eat twice . Stopped all sugar -sweetened beverages    Of note the patient follows with infectious disease clinic for treatment of HIV He also follows with the gastroenterology clinic for cirrhosis of the liver He was treated for epididymitis in August 2023  Denies tingling or numbness  Denies nausea, vomiting or diarrhea   HOME DIABETES REGIMEN: Metformin 500 mg daily   Statin: Yes ACE-I/ARB: No   METER DOWNLOAD SUMMARY: did not bring    DIABETIC COMPLICATIONS: Microvascular complications:   Denies: CKD,  Last eye exam: Completed 12/2020  Macrovascular complications:   Denies: CAD, PVD, CVA   PAST HISTORY: Past Medical History:  Past Medical History:  Diagnosis Date   Cirrhosis ([place])    PT DENIES   Condyloma    ED (erectile dysfunction)    Elevated liver enzymes    GERD (gastroesophageal reflux disease)    HIV (human immunodeficiency virus infection) ([place])    Seborrhea    Splenomegaly    Thrombocytopenia ([place])    Warts    Past Surgical History:  Past Surgical History:  Procedure Laterality Date   BIOPSY  03/21/2020   Procedure: BIOPSY;  Surgeon: [name],  MD;  Location: [facility];  Service: Endoscopy;;   COLONOSCOPY WITH PROPOFOL N/A 03/21/2020   Procedure: COLONOSCOPY WITH PROPOFOL;  Surgeon: [name], MD;  Location: [facility];  Service: Endoscopy;  Laterality: N/A;   CONDYLOMA EXCISION/FULGURATION  2007   CONDYLOMA EXCISION/FULGURATION N/A 07/01/2018   Procedure: CONDYLOMA REMOVAL;  Surgeon: Franchot Gallo, MD;  Location: [facility];  Service: Urology;  Laterality: N/A;   ESOPHAGOGASTRODUODENOSCOPY (EGD) WITH PROPOFOL N/A 03/21/2020   Procedure: ESOPHAGOGASTRODUODENOSCOPY (EGD) WITH PROPOFOL;  Surgeon: [name], MD;  Location: [facility];  Service: Endoscopy;  Laterality: N/A;   HEMOSTASIS CLIP PLACEMENT  03/21/2020   Procedure: HEMOSTASIS CLIP PLACEMENT;  Surgeon: [name], MD;  Location: [facility];  Service: Endoscopy;;   POLYPECTOMY  03/21/2020   Procedure: POLYPECTOMY;  Surgeon: [name], MD;  Location: [facility];  Service: Endoscopy;;    Social History:  reports that he has never smoked. He has never used smokeless tobacco. He reports that he does not currently use alcohol. He reports that he does not use drugs. Family History:  Family History  Problem Relation Age of Onset   Diabetes Mother    Hypertension Mother    Stroke Father    Colon polyps Neg Hx    Colon cancer Neg Hx    Esophageal cancer Neg Hx    Pancreatic cancer Neg Hx    Liver disease Neg Hx  Stomach cancer Neg Hx      HOME MEDICATIONS: Allergies as of 01/08/2022   No Known Allergies      Medication List        Accurate as of January 08, 2022  8:11 AM. If you have any questions, ask your nurse or doctor.          Contour Next EZ w/Device Kit Use as directed to check blood glucose levels   Contour Next Test test strip Generic drug: glucose blood Check blood sugar twice a day as instructed by provider.   darunavir 800 MG tablet Commonly known as: PREZISTA Take 1 tablet (800 mg total)  by mouth daily with breakfast.   doxycycline 100 MG tablet Commonly known as: VIBRA-TABS Take 1 tablet (100 mg total) by mouth 2 (two) times daily.   Genvoya 150-150-200-10 MG Tabs tablet Generic drug: elvitegravir-cobicistat-emtricitabine-tenofovir Take 1 tablet by mouth daily with breakfast.   ketoconazole 2 % cream Commonly known as: NIZORAL Apply topically 2 times daily.   metFORMIN 500 MG tablet Commonly known as: GLUCOPHAGE Take 1 tablet (500 mg total) by mouth daily with breakfast.   Microlet Lancets Misc Use as directed   propranolol 10 MG tablet Commonly known as: INDERAL Take 1 tablet  by mouth 3 times daily.   rosuvastatin 5 MG tablet Commonly known as: Crestor Take 1 tablet (5 mg total) by mouth daily.         ALLERGIES: No Known Allergies   REVIEW OF SYSTEMS: A comprehensive ROS was conducted with the patient and is negative except as per HPI     OBJECTIVE:   VITAL SIGNS: BP 134/88 (BP Location: Left Arm, Patient Position: Sitting, Cuff Size: Large)   Pulse 65   Ht _0  (1.803 m)   Wt 235 lb (106.6 kg)   SpO2 95%   BMI 32.78 kg/m    PHYSICAL EXAM:  General: Pt appears well and is in NAD  Neck: General: Supple without adenopathy or carotid bruits. Thyroid: Thyroid size normal.  No goiter or nodules appreciated.   Lungs: Clear with good BS bilat with no rales, rhonchi, or wheezes  Heart: RRR   Abdomen:  soft, nontender  Extremities:  Lower extremities - No pretibial edema. No lesions.  Neuro: MS is good with appropriate affect, pt is alert and Ox3    DM foot exam: 01/08/2022  The skin of the feet is intact sores or ulcerations but has bilateral callus formation and dystrophic toe nails The pedal pulses are 2+ on right and 2+ on left. The sensation is absent to a screening 5.07, 10 gram monofilament bilaterally   DATA REVIEWED:  Lab Results  Component Value Date   HGBA1C 6.0 (A) 07/30/2021   HGBA1C 13.2 (H) 03/26/2021   HGBA1C  5.5 04/24/2014   Lab Results  Component Value Date   MICROALBUR <0.7 07/30/2021   LDLCALC 108 (H) 11/04/2021   CREATININE 0.73 11/04/2021   Lab Results  Component Value Date   MICRALBCREAT 0.5 07/30/2021    Lab Results  Component Value Date   CHOL 189 11/04/2021   HDL 64 11/04/2021   LDLCALC 108 (H) 11/04/2021   LDLDIRECT 76.0 04/15/2021   TRIG 79 11/04/2021   CHOLHDL 3.0 11/04/2021        ASSESSMENT / PLAN / RECOMMENDATIONS:   1) Type 2 Diabetes Mellitus, Optimally controlled, With Neuropathic  complications - Most recent A1c of 5.8 %. Goal A1c < 7.0 %.    Plan: GENERAL: I have  discussed with the patient the pathophysiology of diabetes. We went over the natural progression of the disease. We stressed the importance of lifestyle changes. I explained the complications associated with diabetes including retinopathy, nephropathy, neuropathy as well as increased risk of cardiovascular disease. We went over the benefit seen with glycemic control.  I explained to the patient that diabetic patients are at higher than normal risk for amputations.  I have praised the pt on improved glycemic control and life style changes  His Metformin was reduced from BID to once daily, will continue once daily for now   MEDICATIONS: Metformin 500 mg XR daily   EDUCATION / INSTRUCTIONS: BG monitoring instructions: Patient is instructed to check his blood sugars 2 times a day. Call [facility] if: BG persistently < 70  I reviewed the Rule of 15 for the treatment of hypoglycemia in detail with the patient. Literature supplied.   2) Diabetic complications:  Eye: Does not have known diabetic retinopathy.  Neuro/ Feet: Does  have known diabetic peripheral neuropathy based on exam 01/08/2022 Renal: Patient does not have known baseline CKD. He is not on an ACEI/ARB at present.  3) Dyslipidemia :  - LDL above goal , he was started on rosuvastatin by PCP in ~07/2021 - We discussed  cardiovascular benefits of statin therapy - Current dose if appropriate with current HIV treatment  - We also discussed low fat diet and regular exercise     4) Peripheral Neuropathy :  - No sensation in both feet  - Discussed importance of daily inspection of feet  - Discussed importance of wearing socks and closed toe shoes      F/U in 6 months     Signed electronically by: Mack Guise, MD  [facility]  [facility] [address] [place], [place] [address] Phone: [phone] FAX: [phone]   CC: Girtha Rm, NP-C [place] [address] Phone: [phone]  Fax: [phone]    Return to Endocrinology clinic as below: No future appointments.

## 2022-01-08 NOTE — Patient Instructions (Addendum)
Continue Metformin 500 mg once daily     Brisk walking 150 minutes a week      HOW TO TREAT LOW BLOOD SUGARS (Blood sugar LESS THAN 70 MG/DL) Please follow the RULE OF 15 for the treatment of hypoglycemia treatment (when your (blood sugars are less than 70 mg/dL)   STEP 1: Take 15 grams of carbohydrates when your blood sugar is low, which includes:  3-4 GLUCOSE TABS  OR 3-4 OZ OF JUICE OR REGULAR SODA OR ONE TUBE OF GLUCOSE GEL    STEP 2: RECHECK blood sugar in 15 MINUTES STEP 3: If your blood sugar is still low at the 15 minute recheck --> then, go back to STEP 1 and treat AGAIN with another 15 grams of carbohydrates.

## 2022-01-28 ENCOUNTER — Other Ambulatory Visit: Payer: Self-pay | Admitting: Pharmacist

## 2022-01-28 DIAGNOSIS — B20 Human immunodeficiency virus [HIV] disease: Secondary | ICD-10-CM

## 2022-02-04 ENCOUNTER — Other Ambulatory Visit: Payer: Self-pay

## 2022-02-04 DIAGNOSIS — B20 Human immunodeficiency virus [HIV] disease: Secondary | ICD-10-CM

## 2022-02-04 MED ORDER — DARUNAVIR 800 MG PO TABS: 800.0000 mg | ORAL_TABLET | Freq: Every day | ORAL | 9 refills | Status: DC

## 2022-02-04 MED ORDER — GENVOYA 150-150-200-10 MG PO TABS: 1.0000 | ORAL_TABLET | Freq: Every day | ORAL | 9 refills | Status: DC

## 2022-03-09 ENCOUNTER — Other Ambulatory Visit (HOSPITAL_COMMUNITY): Payer: Self-pay

## 2022-05-15 ENCOUNTER — Other Ambulatory Visit: Payer: Self-pay | Admitting: Gastroenterology

## 2022-05-15 ENCOUNTER — Other Ambulatory Visit (HOSPITAL_COMMUNITY): Payer: Self-pay

## 2022-05-15 MED ORDER — PROPRANOLOL HCL 10 MG PO TABS
10.0000 mg | ORAL_TABLET | Freq: Three times a day (TID) | ORAL | 0 refills | Status: DC
  Filled 2022-05-15: qty 270, 90d supply, fill #0

## 2022-05-15 NOTE — Telephone Encounter (Signed)
This is a patient of Dr Christella Hartigan.  Please advise if OK to refill medication as you are DOD pm.  Thank you

## 2022-05-18 ENCOUNTER — Other Ambulatory Visit (HOSPITAL_COMMUNITY): Payer: Self-pay

## 2022-05-20 ENCOUNTER — Other Ambulatory Visit (HOSPITAL_COMMUNITY): Payer: Self-pay

## 2022-05-26 ENCOUNTER — Telehealth: Payer: Self-pay

## 2022-05-26 ENCOUNTER — Other Ambulatory Visit: Payer: Self-pay

## 2022-05-26 DIAGNOSIS — K7460 Unspecified cirrhosis of liver: Secondary | ICD-10-CM

## 2022-05-26 NOTE — Telephone Encounter (Signed)
Lamona Curl, CMA  Lamona Curl, CMA Mansouraty, Netty Starring., MD  to Me    05/15/22  5:02 PM Joni Reining, I am okay for the patient to have his propranolol refilled but he also needs to have a clinic visit as he has not been seen in over a year.  He is also not followed up with his HCC screening. Go ahead and give him 2 to 4 months worth of refills.  Have him get it set up for his right upper quadrant ultrasound for [facility] screening and laboratories including CBC/CMP/INR/AFP. He can be scheduled next available with one of the APP's or myself or one of the other MDs. Thanks. GM       05/15/22  2:47 PM You routed this conversation to Mansouraty, Netty Starring., MD Me      05/15/22  2:47 PM Note This is a patient of Dr Christella Hartigan.  Please advise if OK to refill medication as you are DOD pm.   Thank you   Patient is scheduled to have RUQ ultrasound on 06-04-22 at 10:30.  Patient should arrive at 10:15am.  Patient needs to have blood work as well. He will also need a follow up appointment with Dr Meridee Score or an APP after the ultrasound and lab work.  Will continue efforts to make notify patient.

## 2022-05-29 NOTE — Telephone Encounter (Signed)
Patient returned call and is aware that he will need lab work and ultrasound scheduled at [facility] on 06-04-22.  Patient is also aware that he is scheduled to see Doug Sou, PA on 07-29-22.  Patient agreed to plan and verbalized understanding.  No further questions.

## 2022-05-29 NOTE — Telephone Encounter (Signed)
Left message for patient to return call to further discuss lab work and ultrasound scheduled at [facility] on 06-04-22.  Will continue efforts.

## 2022-06-01 ENCOUNTER — Other Ambulatory Visit (INDEPENDENT_AMBULATORY_CARE_PROVIDER_SITE_OTHER): Payer: Commercial Managed Care - HMO

## 2022-06-01 DIAGNOSIS — K7460 Unspecified cirrhosis of liver: Secondary | ICD-10-CM

## 2022-06-01 LAB — COMPREHENSIVE METABOLIC PANEL
ALT: 67 U/L — ABNORMAL HIGH (ref 0–53)
AST: 85 U/L — ABNORMAL HIGH (ref 0–37)
Albumin: 3.6 g/dL (ref 3.5–5.2)
Alkaline Phosphatase: 64 U/L (ref 39–117)
BUN: 6 mg/dL (ref 6–23)
CO2: 23 mEq/L (ref 19–32)
Calcium: 8.7 mg/dL (ref 8.4–10.5)
Chloride: 105 mEq/L (ref 96–112)
Creatinine, Ser: 0.96 mg/dL (ref 0.40–1.50)
GFR: 87.47 mL/min (ref >60.00)
Glucose, Bld: 113 mg/dL — ABNORMAL HIGH (ref 70–99)
Potassium: 4.1 mEq/L (ref 3.5–5.1)
Sodium: 137 mEq/L (ref 135–145)
Total Bilirubin: 1.5 mg/dL — ABNORMAL HIGH (ref 0.2–1.2)
Total Protein: 7 g/dL (ref 6.0–8.3)

## 2022-06-01 LAB — CBC
HCT: 47.5 % (ref 39.0–52.0)
Hemoglobin: 16.6 g/dL (ref 13.0–17.0)
MCHC: 35 g/dL (ref 30.0–36.0)
MCV: 85.5 fl (ref 78.0–100.0)
Platelets: 23 10*3/uL — CL (ref 150.0–400.0)
RBC: 5.56 Mil/uL (ref 4.22–5.81)
RDW: 16.5 % — ABNORMAL HIGH (ref 11.5–15.5)
WBC: 2.9 10*3/uL — ABNORMAL LOW (ref 4.0–10.5)

## 2022-06-01 LAB — PROTIME-INR
INR: 1.4 ratio — ABNORMAL HIGH (ref 0.8–1.0)
Prothrombin Time: 14.7 s — ABNORMAL HIGH (ref 9.6–13.1)

## 2022-06-02 LAB — AFP TUMOR MARKER: AFP-Tumor Marker: 4 ng/mL (ref <6.1)

## 2022-06-04 ENCOUNTER — Ambulatory Visit (HOSPITAL_COMMUNITY)
Admission: RE | Admit: 2022-06-04 | Discharge: 2022-06-04 | Disposition: A | Discharge status: Home / Self Care | Payer: Commercial Managed Care - HMO | Source: Ambulatory Visit | Attending: Gastroenterology | Admitting: Gastroenterology

## 2022-06-04 DIAGNOSIS — K7460 Unspecified cirrhosis of liver: Secondary | ICD-10-CM | POA: Diagnosis present

## 2022-06-23 ENCOUNTER — Other Ambulatory Visit (HOSPITAL_COMMUNITY): Payer: Self-pay

## 2022-06-24 ENCOUNTER — Other Ambulatory Visit (HOSPITAL_COMMUNITY): Payer: Self-pay

## 2022-07-20 ENCOUNTER — Encounter: Payer: Self-pay | Admitting: Internal Medicine

## 2022-07-20 ENCOUNTER — Other Ambulatory Visit (HOSPITAL_COMMUNITY): Payer: Self-pay

## 2022-07-20 ENCOUNTER — Ambulatory Visit: Payer: Commercial Managed Care - HMO | Admitting: Internal Medicine

## 2022-07-20 VITALS — BP 130/74 | HR 60 | Ht 71.0 in | Wt 250.0 lb

## 2022-07-20 DIAGNOSIS — E785 Hyperlipidemia, unspecified: Secondary | ICD-10-CM | POA: Diagnosis not present

## 2022-07-20 DIAGNOSIS — Z7984 Long term (current) use of oral hypoglycemic drugs: Secondary | ICD-10-CM | POA: Diagnosis not present

## 2022-07-20 DIAGNOSIS — E1142 Type 2 diabetes mellitus with diabetic polyneuropathy: Secondary | ICD-10-CM | POA: Diagnosis not present

## 2022-07-20 LAB — POCT GLUCOSE (DEVICE FOR HOME USE): Glucose Fasting, POC: 146 mg/dL — AB (ref 70–99)

## 2022-07-20 LAB — POCT GLYCOSYLATED HEMOGLOBIN (HGB A1C): Hemoglobin A1C: 6.8 % — AB (ref 4.0–5.6)

## 2022-07-20 MED ORDER — METFORMIN HCL ER 500 MG PO TB24
500.0000 mg | ORAL_TABLET | Freq: Every day | ORAL | 3 refills | Status: DC
  Filled 2022-07-20 – 2022-10-10 (×2): qty 90, 90d supply, fill #0

## 2022-07-20 NOTE — Progress Notes (Signed)
Name: Robert Ware  MRN/ DOB: [id number], Mar 30, 1965   Age/ Sex: 57 y.o., male    PCP: Avanell Shackleton, NP-C   Reason for Endocrinology Evaluation: Type 2 Diabetes Mellitus     Date of Initial Endocrinology Visit: 01/08/2022    PATIENT IDENTIFIER: Robert Ware is a 57 y.o. male with a past medical history of DM, HIV, splenomegaly, dyslipidemia, liver cirrhosis. The patient presented for initial endocrinology clinic visit on 01/08/2022 for consultative assistance with his diabetes management.    HPI: Robert Ware was    Diagnosed with DM in 03/2021          Hemoglobin A1c has ranged from 6.0% in 07/2021, peaking at 13.2% in 03/2021.  On his initial visit to our clinic he had an A1c of 5.8%, he was on metformin only which we continued  SUBJECTIVE:   During the last visit (01/08/2022): A1c 5.8%  Today (07/20/2022): Robert Ware is here for follow-up on diabetes management.  He checks his blood sugars occasionally . The patient has not had hypoglycemic episodes since the last clinic visit. The patient is not symptomatic with these episodes.  Patient continues to follow-up with ID for HIV treatment Patient continues to follow-up with GI for cirrhosis of the liver  Denies  nausea or vomiting  Denies  diarrhea     HOME DIABETES REGIMEN: Metformin 500 mg XR  daily   Statin: Yes ACE-I/ARB: No   METER DOWNLOAD SUMMARY: did not bring    DIABETIC COMPLICATIONS: Microvascular complications:   Denies: CKD,  Last eye exam: Completed 12/2020  Macrovascular complications:   Denies: CAD, PVD, CVA   PAST HISTORY: Past Medical History:  Past Medical History:  Diagnosis Date   Cirrhosis (HCC)    PT DENIES   Condyloma    ED (erectile dysfunction)    Elevated liver enzymes    GERD (gastroesophageal reflux disease)    HIV (human immunodeficiency virus infection) (HCC)    Seborrhea    Splenomegaly    Thrombocytopenia (HCC)    Warts    Past Surgical History:   Past Surgical History:  Procedure Laterality Date   BIOPSY  03/21/2020   Procedure: BIOPSY;  Surgeon: Rachael Fee, MD;  Location: [facility];  Service: Endoscopy;;   COLONOSCOPY WITH PROPOFOL N/A 03/21/2020   Procedure: COLONOSCOPY WITH PROPOFOL;  Surgeon: Rachael Fee, MD;  [facility];  Service: Endoscopy;  Laterality: N/A;   CONDYLOMA EXCISION/FULGURATION  2007   CONDYLOMA EXCISION/FULGURATION N/A 07/01/2018   Procedure: CONDYLOMA REMOVAL;  Surgeon: Marcine Matar, MD;  Location: [facility];  Service: Urology;  Laterality: N/A;   ESOPHAGOGASTRODUODENOSCOPY (EGD) WITH PROPOFOL N/A 03/21/2020   Procedure: ESOPHAGOGASTRODUODENOSCOPY (EGD) WITH PROPOFOL;  Surgeon: Rachael Fee, MD;  [facility];  Service: Endoscopy;  Laterality: N/A;   HEMOSTASIS CLIP PLACEMENT  03/21/2020   Procedure: HEMOSTASIS CLIP PLACEMENT;  Surgeon: Rachael Fee, MD;  [facility];  Service: Endoscopy;;   POLYPECTOMY  03/21/2020   Procedure: POLYPECTOMY;  Surgeon: Rachael Fee, MD;  [facility];  Service: Endoscopy;;    Social History:  reports that he has never smoked. He has never used smokeless tobacco. He reports that he does not currently use alcohol. He reports that he does not use drugs. Family History:  Family History  Problem Relation Age of Onset   Diabetes Mother    Hypertension Mother    Stroke Father    Colon polyps Neg Hx    Colon  cancer Neg Hx    Esophageal cancer Neg Hx    Pancreatic cancer Neg Hx    Liver disease Neg Hx    Stomach cancer Neg Hx      HOME MEDICATIONS: Allergies as of 07/20/2022   No Known Allergies      Medication List        Accurate as of July 20, 2022  8:34 AM. If you have any questions, ask your nurse or doctor.          STOP taking these medications    doxycycline 100 MG tablet Commonly known as: VIBRA-TABS Stopped by: Scarlette Shorts, MD       TAKE these medications     Contour Next EZ w/Device Kit Use as directed to check blood glucose levels   Contour Next Test test strip Generic drug: glucose blood Check blood sugar twice a day as instructed by provider.   darunavir 800 MG tablet Commonly known as: PREZISTA Take 1 tablet (800 mg total) by mouth daily with breakfast.   Genvoya 150-150-200-10 MG Tabs tablet Generic drug: elvitegravir-cobicistat-emtricitabine-tenofovir Take 1 tablet by mouth daily with breakfast.   ketoconazole 2 % cream Commonly known as: NIZORAL Apply topically 2 times daily.   metFORMIN 500 MG 24 hr tablet Commonly known as: GLUCOPHAGE-XR Take 1 tablet (500 mg) by mouth daily with breakfast.   Microlet Lancets Misc Use as directed   propranolol 10 MG tablet Commonly known as: INDERAL Take 1 tablet  by mouth 3 times daily.   rosuvastatin 5 MG tablet Commonly known as: Crestor Take 1 tablet (5 mg total) by mouth daily.         ALLERGIES: No Known Allergies   REVIEW OF SYSTEMS: A comprehensive ROS was conducted with the patient and is negative except as per HPI     OBJECTIVE:   VITAL SIGNS: BP 130/74 (BP Location: Left Arm, Patient Position: Sitting, Cuff Size: Large)   Pulse 60   Ht 5\' 11"  (1.803 m)   Wt 250 lb (113.4 kg)   SpO2 96%   BMI 34.87 kg/m    PHYSICAL EXAM:  General: Pt appears well and is in NAD  Neck: General: Supple without adenopathy or carotid bruits. Thyroid: Thyroid size normal.  No goiter or nodules appreciated.   Lungs: Clear with good BS bilat with no rales, rhonchi, or wheezes  Heart: RRR   Abdomen:  soft, nontender  Extremities:  Lower extremities - No pretibial edema. No lesions.  Neuro: MS is good with appropriate affect, pt is alert and Ox3    DM foot exam: 01/08/2022  The skin of the feet is intact sores or ulcerations but has bilateral callus formation and dystrophic toe nails The pedal pulses are 2+ on right and 2+ on left. The sensation is absent to a  screening 5.07, 10 gram monofilament bilaterally   DATA REVIEWED:  Lab Results  Component Value Date   HGBA1C 6.8 (A) 07/20/2022   HGBA1C 5.8 (A) 01/08/2022   HGBA1C 6.0 (A) 07/30/2021   Lab Results  Component Value Date   MICROALBUR <0.7 07/30/2021   LDLCALC 108 (H) 11/04/2021   CREATININE 0.96 06/01/2022   Lab Results  Component Value Date   MICRALBCREAT 0.5 07/30/2021    Lab Results  Component Value Date   CHOL 189 11/04/2021   HDL 64 11/04/2021   LDLCALC 108 (H) 11/04/2021   LDLDIRECT 76.0 04/15/2021   TRIG 79 11/04/2021   CHOLHDL 3.0 11/04/2021  In office BG 146 mg/dL   ASSESSMENT / PLAN / RECOMMENDATIONS:   1) Type 2 Diabetes Mellitus, Optimally controlled, With Neuropathic  complications - Most recent A1c of 6.8 %. Goal A1c < 7.0 %.     - His A1c has increased from 5.8% to 6.8%, this is most likely due to decreasing metformin by 50% in the past -We did entertain the idea of increasing metformin to 2 tablets daily, but the patient opted for lifestyle changes and avoiding [facility] , also discussed walking exercises as well as reducing the amount of carbohydrates  MEDICATIONS: Continue metformin 500 mg XR daily   EDUCATION / INSTRUCTIONS: BG monitoring instructions: Patient is instructed to check his blood sugars 2 times a day. Call [facility] if: BG persistently < 70  I reviewed the Rule of 15 for the treatment of hypoglycemia in detail with the patient. Literature supplied.   2) Diabetic complications:  Eye: Does not have known diabetic retinopathy.  Neuro/ Feet: Does  have known diabetic peripheral neuropathy based on exam 01/08/2022 Renal: Patient does not have known baseline CKD. He is not on an ACEI/ARB at present.  3) Dyslipidemia :  - LDL above goal , he was started on rosuvastatin by PCP in ~07/2021 - We discussed cardiovascular benefits of statin therapy in the past - Current dose is appropriate with current HIV  treatment  -We will continue to monitor    4) Peripheral Neuropathy :  - No sensation in both feet based on foot exam 12/2021 - We had discussed importance of daily inspection of feet    F/U in 6 months     Signed electronically by: Lyndle Herrlich, MD  [facility] Endocrinology  [facility] [address] [place] [place], [place] [address] Phone: [phone] FAX: [phone]   CC: Avanell Shackleton, NP-C [address] [place] [address] Phone: [phone]  Fax: [phone]    Return to Endocrinology clinic as below: Future Appointments  Date Time Provider Department Center  07/27/2022  9:00 AM Zehr, Princella Pellegrini, PA-C [facility]  01/08/2023  8:10 AM Maxcine Strong, Konrad Dolores, MD [facility] None

## 2022-07-20 NOTE — Patient Instructions (Signed)
Continue Metformin 500 mg once daily      HOW TO TREAT LOW BLOOD SUGARS (Blood sugar LESS THAN 70 MG/DL) Please follow the RULE OF 15 for the treatment of hypoglycemia treatment (when your (blood sugars are less than 70 mg/dL)   STEP 1: Take 15 grams of carbohydrates when your blood sugar is low, which includes:  3-4 GLUCOSE TABS  OR 3-4 OZ OF JUICE OR REGULAR SODA OR ONE TUBE OF GLUCOSE GEL    STEP 2: RECHECK blood sugar in 15 MINUTES STEP 3: If your blood sugar is still low at the 15 minute recheck --> then, go back to STEP 1 and treat AGAIN with another 15 grams of carbohydrates.

## 2022-07-27 ENCOUNTER — Other Ambulatory Visit (HOSPITAL_COMMUNITY): Payer: Self-pay

## 2022-07-27 ENCOUNTER — Encounter: Payer: Self-pay | Admitting: Gastroenterology

## 2022-07-27 ENCOUNTER — Ambulatory Visit: Payer: Commercial Managed Care - HMO | Admitting: Gastroenterology

## 2022-07-27 VITALS — BP 136/80 | HR 60 | Ht 71.0 in | Wt 246.0 lb

## 2022-07-27 DIAGNOSIS — K7460 Unspecified cirrhosis of liver: Secondary | ICD-10-CM | POA: Diagnosis not present

## 2022-07-27 DIAGNOSIS — K7581 Nonalcoholic steatohepatitis (NASH): Secondary | ICD-10-CM

## 2022-07-27 MED ORDER — PROPRANOLOL HCL 10 MG PO TABS
10.0000 mg | ORAL_TABLET | Freq: Three times a day (TID) | ORAL | 0 refills | Status: DC
  Filled 2022-07-27 – 2022-10-10 (×3): qty 90, 30d supply, fill #0

## 2022-07-27 NOTE — Progress Notes (Signed)
07/27/2022 Robert Ware [id number] [birth year]  Review of pertinent gastrointestinal problems: 1.  Adenomatous colon polyps.  Colonoscopy February 2022 multiple polyps including larger, high risk adenomas.  Repeat colonoscopy at 3-year interval recommended. 2.  Cirrhosis (slightly elevated liver transaminases, chronically low platelets for many years, INR 1.3, unclear etiology, never alcohol drinker) ? NASH Laboratory work-up January 2022 hepatitis B,C negative, + immune to Hep A.,  ANA negative, iron studies not indicative of iron overload, ceruloplasmin normal, anti-smooth muscle antibody negative antimitochondrial antibody negative, alpha-1 antitrypsin level normal, EGD February 2022 medium to large esophageal varices noted, also portal gastropathy.  Started on nadolol 20mg  daily.  H. pylori positive gastritis was proven and he was put on appropriate antibiotics.  H. pylori stool antigen October 2022 was negative Ultrasound May 2024 ok Hepatitis B immunization series.  Started October 2022 Alpha-fetoprotein May 2024 4.0, normal MELD Na 05/2022 was 11 3. HIV under excellent control, sees infectious disease.  HISTORY OF PRESENT ILLNESS: This is a 57 year old male who is a patient of Dr. Christella Hartigan, his care is being assumed by Dr. Meridee Score.  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  Last labs in May were stable.  Ultrasound okay.  Plan is for repeat labs again in November and we will do a CT liver protocol at that time as well.  He is on propranolol 10 mg 3 times daily for his varices and needs that refilled.  Is due for colonoscopy again in 2025.  Question repeat EGD again at that time as well or if no need since he is on the propranolol.  He feels well.  Has no GI complaints.  No nausea, vomiting, constipation, diarrhea, dark or bloody stools, abdominal pain, confusion, lower extremity swelling.  His weight is up about 10 pounds from December.  Overall feels well.   Past Medical History:   Diagnosis Date   Cirrhosis [facility])    PT DENIES   Condyloma    ED (erectile dysfunction)    Elevated liver enzymes    GERD (gastroesophageal reflux disease)    HIV (human immunodeficiency virus infection) (HCC)    Seborrhea    Splenomegaly    Thrombocytopenia (HCC)    Warts    Past Surgical History:  Procedure Laterality Date   BIOPSY  03/21/2020   Procedure: BIOPSY;  Surgeon: Rachael Fee, MD;  [facility];  Service: Endoscopy;;   COLONOSCOPY WITH PROPOFOL N/A 03/21/2020   Procedure: COLONOSCOPY WITH PROPOFOL;  Surgeon: Rachael Fee, MD;  [facility];  Service: Endoscopy;  Laterality: N/A;   CONDYLOMA EXCISION/FULGURATION  2007   CONDYLOMA EXCISION/FULGURATION N/A 07/01/2018   Procedure: CONDYLOMA REMOVAL;  Surgeon: Marcine Matar, MD;  Location: [facility];  Service: Urology;  Laterality: N/A;   ESOPHAGOGASTRODUODENOSCOPY (EGD) WITH PROPOFOL N/A 03/21/2020   Procedure: ESOPHAGOGASTRODUODENOSCOPY (EGD) WITH PROPOFOL;  Surgeon: Rachael Fee, MD;  [facility];  Service: Endoscopy;  Laterality: N/A;   HEMOSTASIS CLIP PLACEMENT  03/21/2020   Procedure: HEMOSTASIS CLIP PLACEMENT;  Surgeon: Rachael Fee, MD;  [facility];  Service: Endoscopy;;   POLYPECTOMY  03/21/2020   Procedure: POLYPECTOMY;  Surgeon: Rachael Fee, MD;  [facility];  Service: Endoscopy;;    reports that he has never smoked. He has never used smokeless tobacco. He reports that he does not currently use alcohol. He reports that he does not use drugs. family history includes Diabetes in his mother; Hypertension in his mother; Stroke in  his father. No Known Allergies    Outpatient Encounter Medications as of 07/27/2022  Medication Sig   Blood Glucose Monitoring Suppl (CONTOUR NEXT EZ) w/Device KIT Use as directed to check blood glucose levels   darunavir (PREZISTA) 800 MG tablet Take 1 tablet (800 mg total) by mouth daily with  breakfast.   elvitegravir-cobicistat-emtricitabine-tenofovir (GENVOYA) 150-150-200-10 MG TABS tablet Take 1 tablet by mouth daily with breakfast.   glucose blood (CONTOUR NEXT TEST) test strip Check blood sugar twice a day as instructed by provider.   ketoconazole (NIZORAL) 2 % cream Apply topically 2 times daily.   metFORMIN (GLUCOPHAGE-XR) 500 MG 24 hr tablet Take 1 tablet (500 mg) by mouth daily with breakfast.   Microlet Lancets MISC Use as directed   propranolol (INDERAL) 10 MG tablet Take 1 tablet  by mouth 3 times daily.   rosuvastatin (CRESTOR) 5 MG tablet Take 1 tablet (5 mg total) by mouth daily.   [DISCONTINUED] bismuth-metronidazole-tetracycline (PYLERA) 140-125-125 MG capsule TAKE 3 CAPSULES BY MOUTH 4 TIMES DAILY BEFORE MEALS AND AT BEDTIME FOR 10 DAYS   [DISCONTINUED] nadolol (CORGARD) 20 MG tablet Take 1 tablet (20 mg total) by mouth daily.   No facility-administered encounter medications on file as of 07/27/2022.    REVIEW OF SYSTEMS  : All other systems reviewed and negative except where noted in the History of Present Illness.   PHYSICAL EXAM: BP 136/80   Pulse 60   Ht 5\' 11"  (1.803 m)   Wt 246 lb (111.6 kg)   BMI 34.31 kg/m  General: Well developed AA male in no acute distress Head: Normocephalic and atraumatic Eyes:  Sclerae anicteric, conjunctiva pink. Ears: Normal auditory acuity Lungs: Clear throughout to auscultation; no W/R/R. Heart: Regular rate and rhythm; no M/R/G. Abdomen: Soft, non-distended.  BS present.  Non-tender. Musculoskeletal: Symmetrical with no gross deformities  Skin: No lesions on visible extremities Extremities: No edema  Neurological: Alert oriented x 4, grossly non-focal Psychological:  Alert and cooperative. Normal mood and affect  ASSESSMENT AND PLAN: *57 y.o. 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  Had esophageal and rectal varices on last EGD and colonoscopy in 2022 so is  on propranolol 10 mg 3 times daily and is tolerating that well.  Needs that refilled.  Prescription sent to pharmacy.  Last labs and ultrasound in May were stable.  Will update labs again with a CBC, CMP, PT/INR, AFP, and imaging with a CT liver protocol in November.  MELD 3.0: 11 at 06/01/2022  8:08 AM MELD-Na: 12 at 06/01/2022  8:08 AM Calculated from: Serum Creatinine: 0.96 mg/dL (Using min of 1 mg/dL) at 01/31/1094  0:45 AM Serum Sodium: 137 mEq/L at 06/01/2022  8:08 AM Total Bilirubin: 1.5 mg/dL at 4/0/9811  9:14 AM Serum Albumin: 3.6 g/dL (Using max of 3.5 g/dL) at 08/03/2954  2:13 AM INR(ratio): 1.4 ratio at 06/01/2022  8:08 AM Age at listing (hypothetical): 51 years Sex: Male at 06/01/2022  8:08 AM    CC:  Avanell Shackleton, NP-C

## 2022-07-27 NOTE — Patient Instructions (Signed)
We have sent the following medications to your pharmacy for you to pick up at your convenience: Propanolol 10 mg three times daily.   _______________________________________________________  If your blood pressure at your visit was 140/90 or greater, please contact your primary care physician to follow up on this.  _______________________________________________________  If you are age 59 or older, your body mass index should be between 23-30. Your Body mass index is 34.31 kg/m. If this is out of the aforementioned range listed, please consider follow up with your Primary Care Provider.  If you are age 10 or younger, your body mass index should be between 19-25. Your Body mass index is 34.31 kg/m. If this is out of the aformentioned range listed, please consider follow up with your Primary Care Provider.   ________________________________________________________  The [place] [facility] providers would like to encourage you to use [facility] to communicate with providers for non-urgent requests or questions.  Due to long hold times on the telephone, sending your provider a message by [facility] may be a faster and more efficient way to get a response.  Please allow 48 business hours for a response.  Please remember that this is for non-urgent requests.  _______________________________________________________

## 2022-07-28 ENCOUNTER — Other Ambulatory Visit: Payer: Self-pay

## 2022-07-29 ENCOUNTER — Ambulatory Visit: Payer: PRIVATE HEALTH INSURANCE | Admitting: Gastroenterology

## 2022-07-29 NOTE — Progress Notes (Signed)
Attending Physician's Attestation   I have reviewed the chart.   I agree with the Advanced Practitioner's note, impression, and recommendations with any updates as below.    Qamar Rosman Mansouraty, MD [facility] Advanced Endoscopy Office # [phone]  

## 2022-09-09 IMAGING — US US ABDOMEN LIMITED
1 series · 15 of 25 positions shown · non-contrast
Comparison: 07/31/2020

CLINICAL DATA: Cirrhosis of liver without ascites. Hepatoma
screening.

EXAM:
ULTRASOUND ABDOMEN LIMITED RIGHT UPPER QUADRANT

[Series 1: us abdomen limited ruq mc & wl · 15 of 36 slices shown]
[im 1/36]
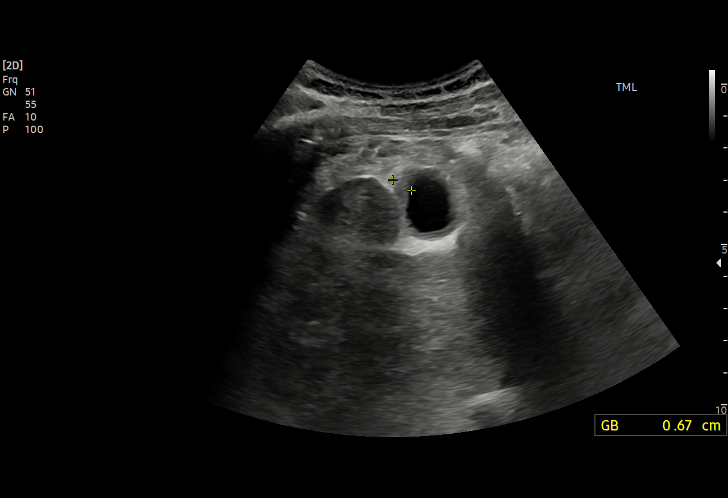
[im 3/36]
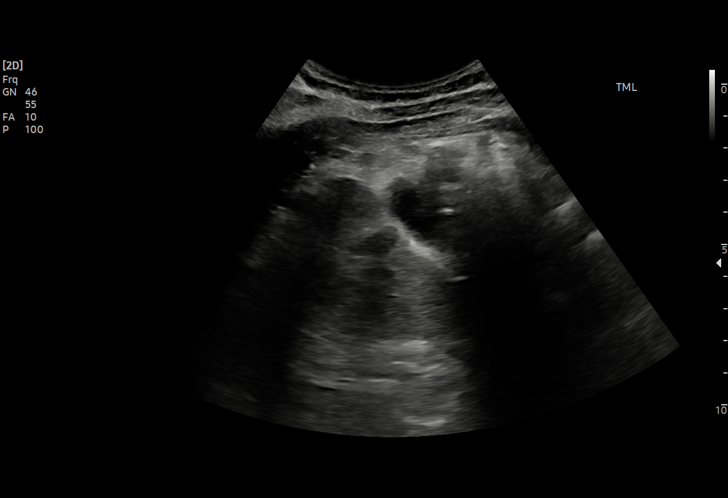
[im 6/36]
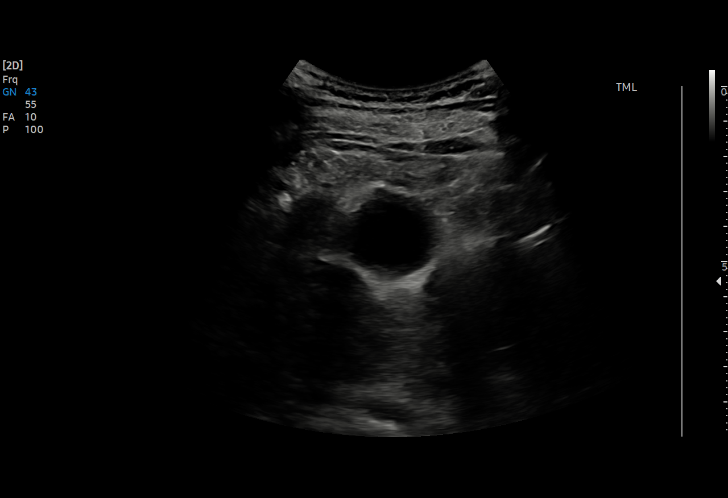
[im 8/36]
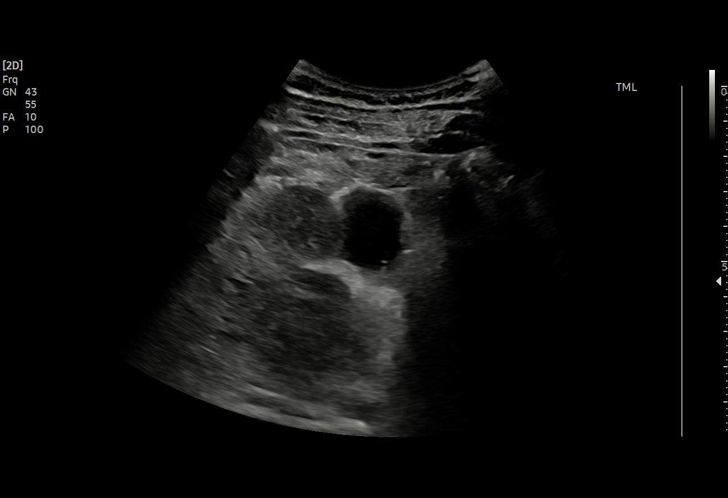
[im 11/36]
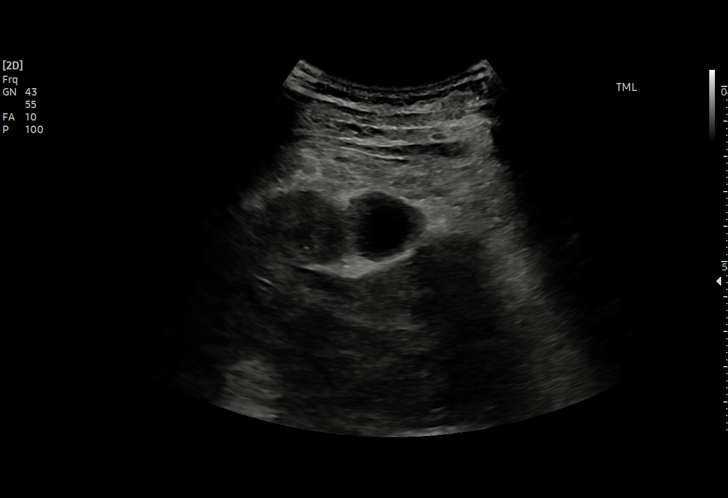
[im 14/36]
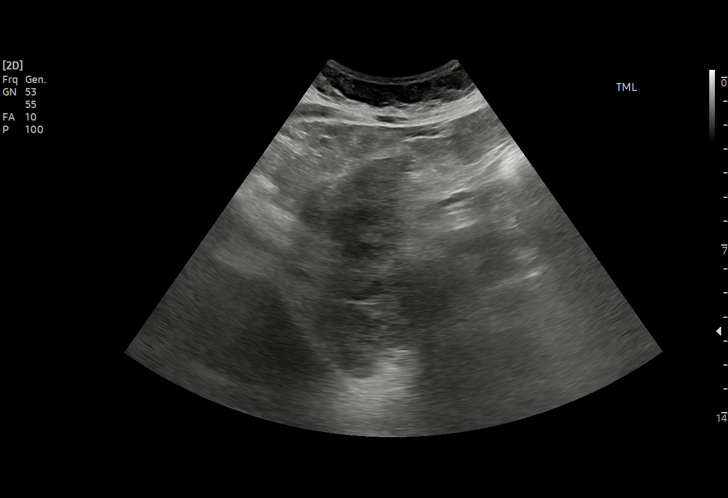
[im 15/36]
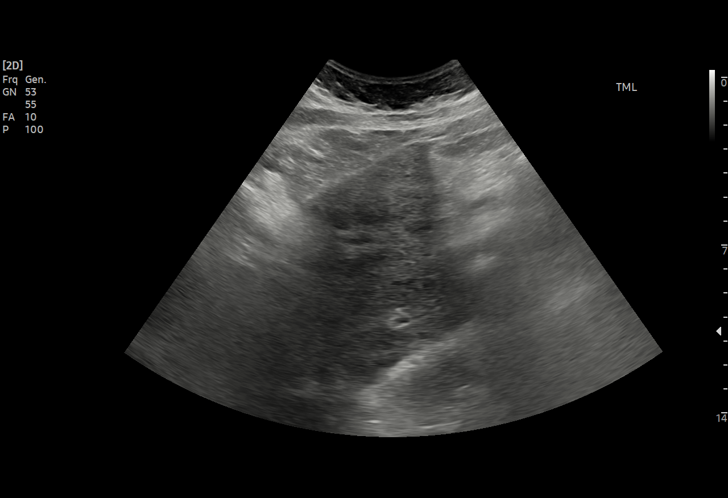
[im 18/36]
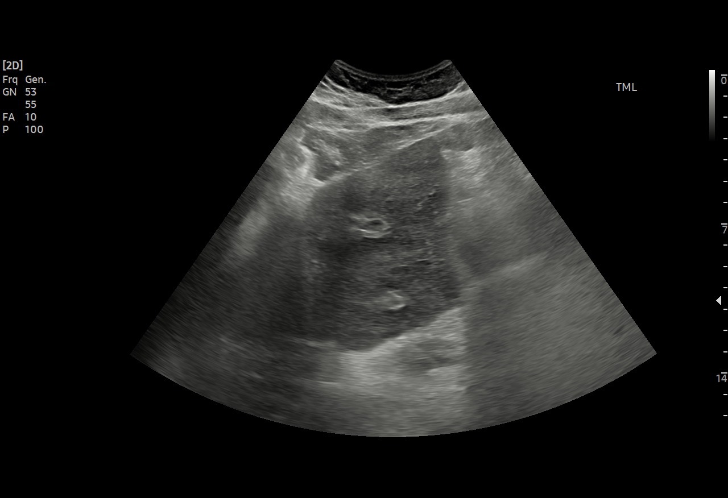
[im 21/36]
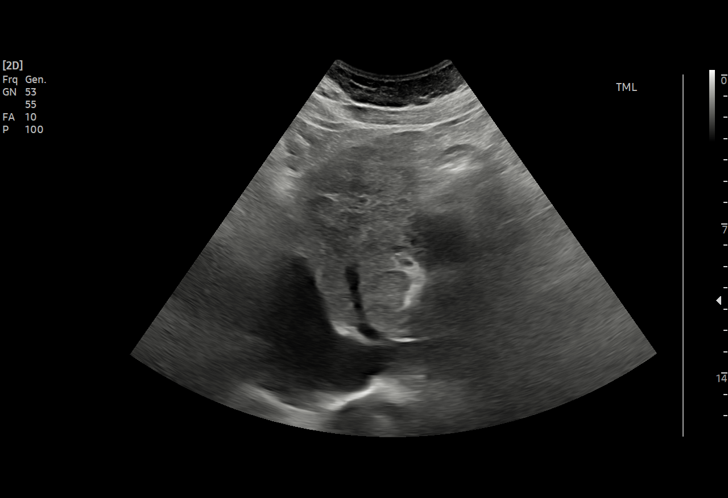
[im 22/36]
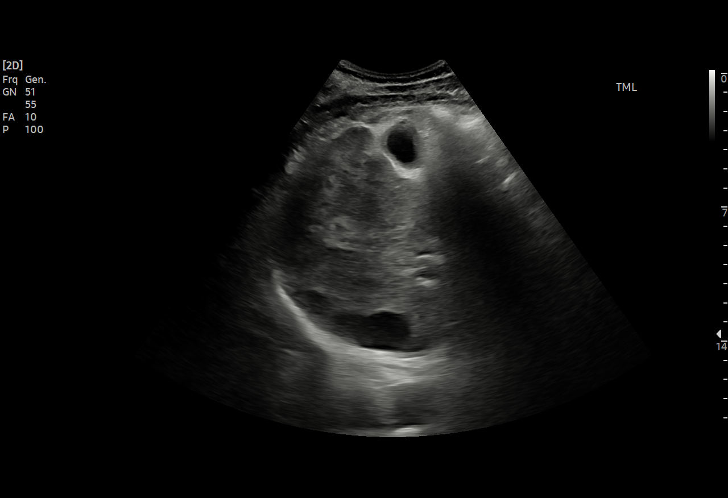
[im 25/36]
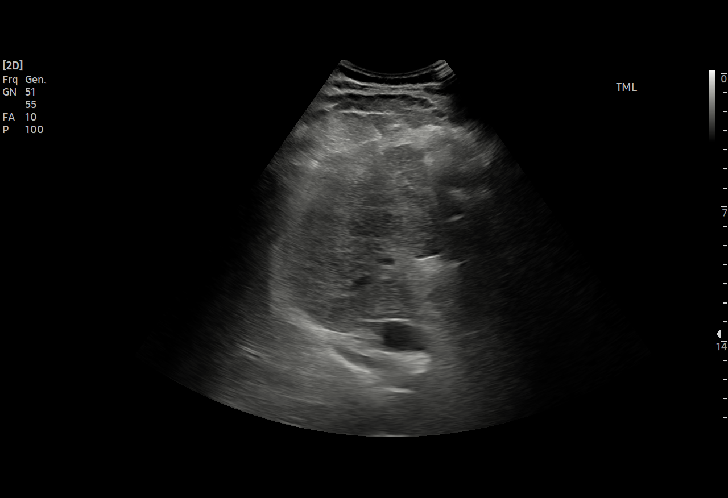
[im 28/36]
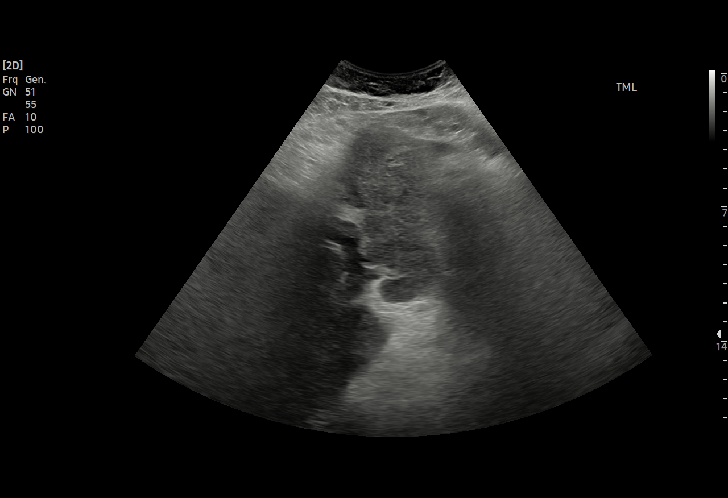
[im 30/36]
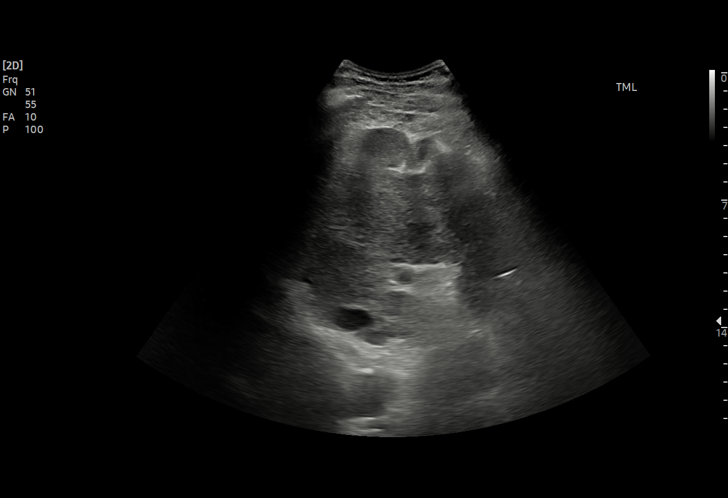
[im 33/36]
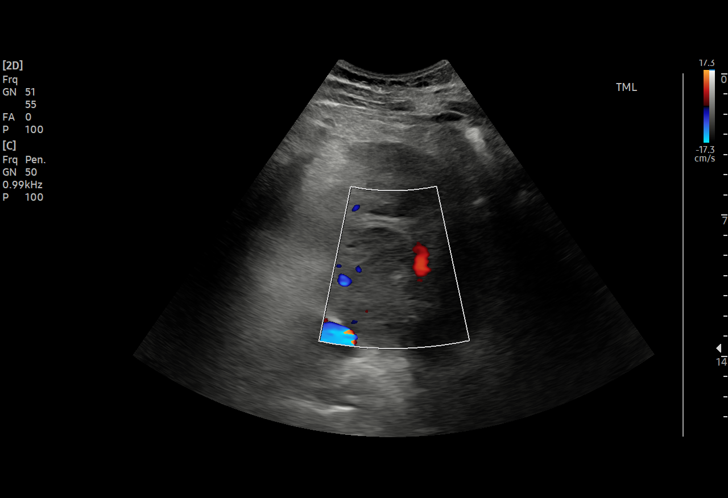
[im 36/36]
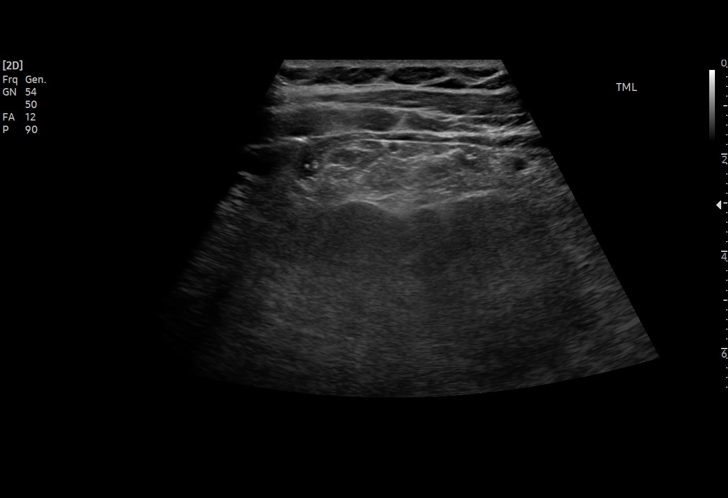

[15 of 25 positions shown; findings below may reference images not displayed]

FINDINGS: Gallbladder:

Gallbladder is small and contracted. Gallbladder wall is thickened
measuring close to 7 mm. No gallstones. Asymmetric wall thickening
in the gallbladder. Reportedly, there is no sonographic Murphy sign.

Common bile duct:

Diameter: 7 mm

Liver:

Nodular contour. Liver parenchyma is heterogeneous and poorly
characterized. Limited evaluation of the liver due to bowel gas.
Portal vein is not confidently identified on this examination.

Other: None.
IMPRESSION: 1. Liver is nodular and heterogeneous. Findings are compatible with
cirrhosis. Limited evaluation of the liver parenchyma and portal
vein is not clearly identified. Due to the limitations on this
examination, consider further characterization with liver MRI.
2. Small contracted gallbladder. Gallbladder wall asymmetry is
nonspecific. No definite gallstones.

## 2022-09-11 ENCOUNTER — Ambulatory Visit: Payer: Commercial Managed Care - HMO

## 2022-09-15 ENCOUNTER — Ambulatory Visit: Payer: Commercial Managed Care - HMO

## 2022-09-15 ENCOUNTER — Other Ambulatory Visit: Payer: Self-pay

## 2022-10-10 ENCOUNTER — Other Ambulatory Visit (HOSPITAL_COMMUNITY): Payer: Self-pay

## 2022-10-10 ENCOUNTER — Other Ambulatory Visit: Payer: Self-pay | Admitting: Gastroenterology

## 2022-10-12 ENCOUNTER — Other Ambulatory Visit (HOSPITAL_COMMUNITY): Payer: Self-pay

## 2022-10-12 MED ORDER — PROPRANOLOL HCL 10 MG PO TABS
10.0000 mg | ORAL_TABLET | Freq: Three times a day (TID) | ORAL | 0 refills | Status: DC
  Filled 2022-10-12: qty 270, 90d supply, fill #0

## 2022-10-17 ENCOUNTER — Other Ambulatory Visit (HOSPITAL_COMMUNITY): Payer: Self-pay

## 2022-11-02 ENCOUNTER — Telehealth: Payer: Self-pay

## 2022-11-02 NOTE — Telephone Encounter (Signed)
Called patient regarding refill request for Darunavir. Patient is overdue for appt. Will need to schedule appt with new provider in order to get refills. Left voicemail. Juanita Laster, RMA

## 2022-11-09 ENCOUNTER — Other Ambulatory Visit: Payer: Self-pay

## 2022-11-09 DIAGNOSIS — B20 Human immunodeficiency virus [HIV] disease: Secondary | ICD-10-CM

## 2022-11-09 DIAGNOSIS — Z113 Encounter for screening for infections with a predominantly sexual mode of transmission: Secondary | ICD-10-CM

## 2022-11-09 DIAGNOSIS — Z79899 Other long term (current) drug therapy: Secondary | ICD-10-CM

## 2022-11-10 ENCOUNTER — Other Ambulatory Visit (HOSPITAL_COMMUNITY)
Admission: RE | Admit: 2022-11-10 | Discharge: 2022-11-10 | Disposition: A | Discharge status: Home / Self Care | Payer: Commercial Managed Care - HMO | Source: Ambulatory Visit | Attending: Internal Medicine | Admitting: Internal Medicine

## 2022-11-10 ENCOUNTER — Other Ambulatory Visit: Payer: Self-pay

## 2022-11-10 ENCOUNTER — Other Ambulatory Visit: Payer: Commercial Managed Care - HMO

## 2022-11-10 DIAGNOSIS — Z113 Encounter for screening for infections with a predominantly sexual mode of transmission: Secondary | ICD-10-CM | POA: Insufficient documentation

## 2022-11-10 DIAGNOSIS — Z79899 Other long term (current) drug therapy: Secondary | ICD-10-CM | POA: Insufficient documentation

## 2022-11-10 DIAGNOSIS — B20 Human immunodeficiency virus [HIV] disease: Secondary | ICD-10-CM | POA: Insufficient documentation

## 2022-11-11 LAB — URINE CYTOLOGY ANCILLARY ONLY
Chlamydia: NEGATIVE
Comment: NEGATIVE
Comment: NORMAL
Neisseria Gonorrhea: NEGATIVE

## 2022-11-11 LAB — T-HELPER CELL (CD4) - (RCID CLINIC ONLY)
CD4 % Helper T Cell: 36 % (ref 33–65)
CD4 T Cell Abs: 298 /uL — ABNORMAL LOW (ref 400–1790)

## 2022-11-13 LAB — COMPLETE METABOLIC PANEL WITH GFR
AG Ratio: 1 (calc) (ref 1.0–2.5)
ALT: 62 U/L — ABNORMAL HIGH (ref 9–46)
AST: 87 U/L — ABNORMAL HIGH (ref 10–35)
Albumin: 3.6 g/dL (ref 3.6–5.1)
Alkaline phosphatase (APISO): 94 U/L (ref 35–144)
BUN: 13 mg/dL (ref 7–25)
CO2: 23 mmol/L (ref 20–32)
Calcium: 9.4 mg/dL (ref 8.6–10.3)
Chloride: 106 mmol/L (ref 98–110)
Creat: 0.86 mg/dL (ref 0.70–1.30)
Globulin: 3.5 g/dL (ref 1.9–3.7)
Glucose, Bld: 116 mg/dL — ABNORMAL HIGH (ref 65–99)
Potassium: 4.1 mmol/L (ref 3.5–5.3)
Sodium: 138 mmol/L (ref 135–146)
Total Bilirubin: 0.9 mg/dL (ref 0.2–1.2)
Total Protein: 7.1 g/dL (ref 6.1–8.1)
eGFR: 100 mL/min/{1.73_m2} (ref >=60)

## 2022-11-13 LAB — CBC WITH DIFFERENTIAL/PLATELET
Absolute Lymphocytes: 1143 {cells}/uL (ref 850–3900)
Absolute Monocytes: 243 {cells}/uL (ref 200–950)
Basophils Absolute: 10 {cells}/uL (ref 0–200)
Basophils Relative: 0.4 %
Eosinophils Absolute: 30 {cells}/uL (ref 15–500)
Eosinophils Relative: 1.2 %
HCT: 47.8 % (ref 38.5–50.0)
Hemoglobin: 15.8 g/dL (ref 13.2–17.1)
MCH: 28.4 pg (ref 27.0–33.0)
MCHC: 33.1 g/dL (ref 32.0–36.0)
MCV: 85.8 fL (ref 80.0–100.0)
Monocytes Relative: 9.7 %
Neutro Abs: 1075 {cells}/uL — ABNORMAL LOW (ref 1500–7800)
Neutrophils Relative %: 43 %
Platelets: 10 10*3/uL — CL (ref 140–400)
RBC: 5.57 10*6/uL (ref 4.20–5.80)
RDW: 16.5 % — ABNORMAL HIGH (ref 11.0–15.0)
Total Lymphocyte: 45.7 %
WBC: 2.5 10*3/uL — ABNORMAL LOW (ref 3.8–10.8)

## 2022-11-13 LAB — LIPID PANEL
Cholesterol: 205 mg/dL — ABNORMAL HIGH (ref <200)
HDL: 65 mg/dL (ref >=40)
LDL Cholesterol (Calc): 104 mg/dL — ABNORMAL HIGH
Non-HDL Cholesterol (Calc): 140 mg/dL — ABNORMAL HIGH (ref <130)
Total CHOL/HDL Ratio: 3.2 (calc) (ref <5.0)
Triglycerides: 238 mg/dL — ABNORMAL HIGH (ref <150)

## 2022-11-13 LAB — RPR: RPR Ser Ql: NONREACTIVE

## 2022-11-13 LAB — HIV-1 RNA QUANT-NO REFLEX-BLD
HIV 1 RNA Quant: NOT DETECTED {copies}/mL
HIV-1 RNA Quant, Log: NOT DETECTED {Log}

## 2022-11-24 ENCOUNTER — Encounter: Payer: Self-pay | Admitting: Internal Medicine

## 2022-11-24 ENCOUNTER — Other Ambulatory Visit: Payer: Self-pay

## 2022-11-24 ENCOUNTER — Ambulatory Visit: Payer: Managed Care, Other (non HMO) | Admitting: Internal Medicine

## 2022-11-24 VITALS — BP 132/79 | HR 65 | Resp 16 | Ht 71.0 in | Wt 243.0 lb

## 2022-11-24 DIAGNOSIS — E785 Hyperlipidemia, unspecified: Secondary | ICD-10-CM | POA: Diagnosis not present

## 2022-11-24 DIAGNOSIS — Z23 Encounter for immunization: Secondary | ICD-10-CM | POA: Diagnosis not present

## 2022-11-24 DIAGNOSIS — K7460 Unspecified cirrhosis of liver: Secondary | ICD-10-CM

## 2022-11-24 DIAGNOSIS — D696 Thrombocytopenia, unspecified: Secondary | ICD-10-CM

## 2022-11-24 DIAGNOSIS — Z113 Encounter for screening for infections with a predominantly sexual mode of transmission: Secondary | ICD-10-CM | POA: Insufficient documentation

## 2022-11-24 DIAGNOSIS — K7581 Nonalcoholic steatohepatitis (NASH): Secondary | ICD-10-CM | POA: Diagnosis not present

## 2022-11-24 DIAGNOSIS — B20 Human immunodeficiency virus [HIV] disease: Secondary | ICD-10-CM | POA: Diagnosis not present

## 2022-11-24 DIAGNOSIS — E7800 Pure hypercholesterolemia, unspecified: Secondary | ICD-10-CM

## 2022-11-24 MED ORDER — DARUNAVIR 800 MG PO TABS
800.0000 mg | ORAL_TABLET | Freq: Every day | ORAL | 11 refills | Status: DC
Start: 2022-11-24 — End: 2023-10-26

## 2022-11-24 MED ORDER — GENVOYA 150-150-200-10 MG PO TABS
1.0000 | ORAL_TABLET | Freq: Every day | ORAL | 11 refills | Status: DC
Start: 2022-11-24 — End: 2023-10-26

## 2022-11-24 NOTE — Assessment & Plan Note (Signed)
Screened negative 

## 2022-11-24 NOTE — Assessment & Plan Note (Signed)
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  Will continue with the same and refills sent.   Reviewed notes on resistance.  Follow up in 11 months

## 2022-11-24 NOTE — Assessment & Plan Note (Signed)
Continues to follow up with GI

## 2022-11-24 NOTE — Progress Notes (Signed)
   Subjective:    Patient ID: Robert Ware, male    DOB: 05/14/1965, 57 y.o.   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  He denies any missed doses.  He feels well and working on his sugar control.  He has no complaints today.     Review of Systems  Constitutional:  Negative for fatigue.  Gastrointestinal:  Negative for diarrhea.  Skin:  Negative for rash.       Objective:   Physical Exam Eyes:     General: No scleral icterus. Pulmonary:     Effort: Pulmonary effort is normal.  Neurological:     Mental Status: He is alert.   SH: no tobacco        Assessment & Plan:

## 2022-11-24 NOTE — Assessment & Plan Note (Signed)
Lipid panel reviewed with him  

## 2022-11-30 ENCOUNTER — Telehealth: Payer: Self-pay | Admitting: *Deleted

## 2022-11-30 DIAGNOSIS — K7460 Unspecified cirrhosis of liver: Secondary | ICD-10-CM

## 2022-11-30 NOTE — Telephone Encounter (Signed)
-----   Message from [facility] V sent at 07/27/2022  9:43 AM EDT ----- Patient needs in 11/2022 CBC, CMET, PT/INR, AFP  CT abd - liver protocol

## 2022-12-07 ENCOUNTER — Telehealth: Payer: Self-pay

## 2022-12-07 DIAGNOSIS — K7460 Unspecified cirrhosis of liver: Secondary | ICD-10-CM

## 2022-12-07 NOTE — Telephone Encounter (Signed)
-----   Message from Nurse Maeva Dant P sent at 06/05/2022  8:27 AM EDT ----- 85-month CT-Abdomen Liver protocol

## 2022-12-08 NOTE — Telephone Encounter (Signed)
Left message on machine to call back  

## 2022-12-09 NOTE — Telephone Encounter (Signed)
Spoke with the pt and he has been advised that he is due for CT scan.  Order has been entered and sent to the schedulers.

## 2022-12-15 ENCOUNTER — Telehealth: Payer: Self-pay

## 2022-12-15 NOTE — Telephone Encounter (Signed)
Spoke with [facility] and they will contact the pt to set up with that facility.

## 2022-12-15 NOTE — Telephone Encounter (Signed)
-----   Message from April M sent at 12/15/2022  8:00 AM EST ----- Regarding: CT SCAN Hi Robert Ware, This patient's insurance would not approve any hospital facility, I did get it approved with [facility].   Authorization Number: [id number] [facility] [address] [place], [place] [address] Phone: [phone]  Thank you.

## 2022-12-18 ENCOUNTER — Ambulatory Visit (HOSPITAL_COMMUNITY): Payer: Managed Care, Other (non HMO)

## 2022-12-18 NOTE — Telephone Encounter (Signed)
Patient CT scan scheduled at [facility] for 12/02. Left message for patient to call office to inform him of blood test.

## 2022-12-28 ENCOUNTER — Ambulatory Visit
Admission: RE | Admit: 2022-12-28 | Discharge: 2022-12-28 | Disposition: A | Discharge status: Home / Self Care | Payer: Commercial Managed Care - HMO | Source: Ambulatory Visit | Attending: Gastroenterology | Admitting: Gastroenterology

## 2022-12-28 DIAGNOSIS — K7460 Unspecified cirrhosis of liver: Secondary | ICD-10-CM

## 2023-01-08 ENCOUNTER — Ambulatory Visit (INDEPENDENT_AMBULATORY_CARE_PROVIDER_SITE_OTHER): Payer: Commercial Managed Care - HMO | Admitting: Internal Medicine

## 2023-01-08 ENCOUNTER — Encounter: Payer: Self-pay | Admitting: Internal Medicine

## 2023-01-08 ENCOUNTER — Other Ambulatory Visit (HOSPITAL_COMMUNITY): Payer: Self-pay

## 2023-01-08 VITALS — BP 122/80 | HR 74 | Ht 71.0 in | Wt 236.0 lb

## 2023-01-08 DIAGNOSIS — G63 Polyneuropathy in diseases classified elsewhere: Secondary | ICD-10-CM | POA: Insufficient documentation

## 2023-01-08 DIAGNOSIS — B351 Tinea unguium: Secondary | ICD-10-CM | POA: Insufficient documentation

## 2023-01-08 DIAGNOSIS — E1142 Type 2 diabetes mellitus with diabetic polyneuropathy: Secondary | ICD-10-CM | POA: Diagnosis not present

## 2023-01-08 DIAGNOSIS — E785 Hyperlipidemia, unspecified: Secondary | ICD-10-CM | POA: Insufficient documentation

## 2023-01-08 DIAGNOSIS — E7800 Pure hypercholesterolemia, unspecified: Secondary | ICD-10-CM

## 2023-01-08 LAB — POCT GLYCOSYLATED HEMOGLOBIN (HGB A1C): Hemoglobin A1C: 5.5 % (ref 4.0–5.6)

## 2023-01-08 LAB — POCT GLUCOSE (DEVICE FOR HOME USE): Glucose Fasting, POC: 120 mg/dL — AB (ref 70–99)

## 2023-01-08 MED ORDER — ROSUVASTATIN CALCIUM 5 MG PO TABS
5.0000 mg | ORAL_TABLET | Freq: Every day | ORAL | 3 refills | Status: DC
  Filled 2023-01-08 – 2023-04-03 (×2): qty 90, 90d supply, fill #0

## 2023-01-08 NOTE — Progress Notes (Signed)
Name: Robert Ware  MRN/ DOB: [id number], 1965-06-16   Age/ Sex: 57 y.o., male    PCP: Avanell Shackleton, NP-C   Reason for Endocrinology Evaluation: Type 2 Diabetes Mellitus     Date of Initial Endocrinology Visit: 01/08/2022    PATIENT IDENTIFIER: [name] is a 57 y.o. male with a past medical history of DM, HIV, splenomegaly, dyslipidemia, liver cirrhosis. The patient presented for initial endocrinology clinic visit on 01/08/2022 for consultative assistance with his diabetes management.    HPI: [name] was    Diagnosed with DM in 03/2021          Hemoglobin A1c has ranged from 6.0% in 07/2021, peaking at 13.2% in 03/2021.  On his initial visit to our clinic he had an A1c of 5.8%, he was on metformin only which we continued  SUBJECTIVE:   During the last visit (07/20/2022): A1c 6.8%  Today (07/20/2022): Robert Ware is here for follow-up on diabetes management.  He checks his blood sugars 2x daily  . The patient has not had hypoglycemic episodes since the last clinic visit.  Patient continues to follow-up with ID for HIV treatment Patient continues to follow-up with GI for cirrhosis of the liver  Denies nausea or vomiting  Denies  constipation or diarrhea  No recent blood transfusion  No vitamin intake  Patient has been noted with hyperpigmentation of lower extremities   HOME DIABETES REGIMEN: Metformin 500 mg XR  daily   Statin: Yes ACE-I/ARB: No   METER DOWNLOAD SUMMARY: did not bring    DIABETIC COMPLICATIONS: Microvascular complications:   Denies: CKD,  Last eye exam: Completed 12/2020  Macrovascular complications:   Denies: CAD, PVD, CVA   PAST HISTORY: Past Medical History:  Past Medical History:  Diagnosis Date   Cirrhosis (HCC)    PT DENIES   Condyloma    ED (erectile dysfunction)    Elevated liver enzymes    GERD (gastroesophageal reflux disease)    HIV (human immunodeficiency virus infection) (HCC)    Seborrhea     Splenomegaly    Thrombocytopenia (HCC)    Warts    Past Surgical History:  Past Surgical History:  Procedure Laterality Date   BIOPSY  03/21/2020   Procedure: BIOPSY;  Surgeon: Rachael Fee, MD;  Location: [facility];  Service: Endoscopy;;   COLONOSCOPY WITH PROPOFOL N/A 03/21/2020   Procedure: COLONOSCOPY WITH PROPOFOL;  Surgeon: Rachael Fee, MD;  Location: [facility];  Service: Endoscopy;  Laterality: N/A;   CONDYLOMA EXCISION/FULGURATION  2007   CONDYLOMA EXCISION/FULGURATION N/A 07/01/2018   Procedure: CONDYLOMA REMOVAL;  Surgeon: Marcine Matar, MD;  Location: [facility];  Service: Urology;  Laterality: N/A;   ESOPHAGOGASTRODUODENOSCOPY (EGD) WITH PROPOFOL N/A 03/21/2020   Procedure: ESOPHAGOGASTRODUODENOSCOPY (EGD) WITH PROPOFOL;  Surgeon: Rachael Fee, MD;  Location: [facility];  Service: Endoscopy;  Laterality: N/A;   HEMOSTASIS CLIP PLACEMENT  03/21/2020   Procedure: HEMOSTASIS CLIP PLACEMENT;  Surgeon: Rachael Fee, MD;  Location: [facility];  Service: Endoscopy;;   POLYPECTOMY  03/21/2020   Procedure: POLYPECTOMY;  Surgeon: Rachael Fee, MD;  Location: [facility];  Service: Endoscopy;;    Social History:  reports that he has never smoked. He has never used smokeless tobacco. He reports that he does not currently use alcohol. He reports that he does not use drugs. Family History:  Family History  Problem Relation Age of Onset   Diabetes Mother    Hypertension Mother    Stroke  Father    Colon polyps Neg Hx    Colon cancer Neg Hx    Esophageal cancer Neg Hx    Pancreatic cancer Neg Hx    Liver disease Neg Hx    Stomach cancer Neg Hx      HOME MEDICATIONS: Allergies as of 01/08/2023   No Known Allergies      Medication List        Accurate as of January 08, 2023  6:51 AM. If you have any questions, ask your nurse or doctor.          Contour Next EZ w/Device Kit Use as directed to check blood glucose levels    Contour Next Test test strip Generic drug: glucose blood Check blood sugar twice a day as instructed by provider.   darunavir 800 MG tablet Commonly known as: PREZISTA Take 1 tablet (800 mg total) by mouth daily with breakfast.   Genvoya 150-150-200-10 MG Tabs tablet Generic drug: elvitegravir-cobicistat-emtricitabine-tenofovir Take 1 tablet by mouth daily with breakfast.   ketoconazole 2 % cream Commonly known as: NIZORAL Apply topically 2 times daily.   metFORMIN 500 MG 24 hr tablet Commonly known as: GLUCOPHAGE-XR Take 1 tablet (500 mg) by mouth daily with breakfast.   Microlet Lancets Misc Use as directed   propranolol 10 MG tablet Commonly known as: INDERAL Take 1 tablet  by mouth 3 times daily.   rosuvastatin 5 MG tablet Commonly known as: Crestor Take 1 tablet (5 mg total) by mouth daily.         ALLERGIES: No Known Allergies   REVIEW OF SYSTEMS: A comprehensive ROS was conducted with the patient and is negative except as per HPI     OBJECTIVE:   VITAL SIGNS: There were no vitals taken for this visit.   PHYSICAL EXAM:  General: Pt appears well and is in NAD  Neck: General: Supple without adenopathy or carotid bruits. Thyroid: Thyroid size normal.  No goiter or nodules appreciated.   Lungs: Clear with good BS bilat with no rales, rhonchi, or wheezes  Heart: RRR   Abdomen:  soft, nontender  Extremities:  Lower extremities - No pretibial edema. No lesions.  Neuro: MS is good with appropriate affect, pt is alert and Ox3    DM foot exam: 01/08/2023  The skin of the feet is intact sores or ulcerations but has bilateral callus formation and onychomycosis The pedal pulses are 2+ on right and 2+ on left. The sensation is decreased to a screening 5.07, 10 gram monofilament bilaterally   DATA REVIEWED:  Lab Results  Component Value Date   HGBA1C 6.8 (A) 07/20/2022   HGBA1C 5.8 (A) 01/08/2022   HGBA1C 6.0 (A) 07/30/2021    Latest Reference Range  & Units 11/10/22 08:49  Sodium 135 - 146 mmol/L 138  Potassium 3.5 - 5.3 mmol/L 4.1  Chloride 98 - 110 mmol/L 106  CO2 20 - 32 mmol/L 23  Glucose 65 - 99 mg/dL 865 (H)  BUN 7 - 25 mg/dL 13  Creatinine 7.84 - 6.96 mg/dL 2.95  Calcium 8.6 - 28.4 mg/dL 9.4  BUN/Creatinine Ratio 6 - 22 (calc) SEE NOTE:  eGFR > OR = 60 mL/min/1.21m2 100  AG Ratio 1.0 - 2.5 (calc) 1.0  AST 10 - 35 U/L 87 (H)  ALT 9 - 46 U/L 62 (H)  Total Protein 6.1 - 8.1 g/dL 7.1  Total Bilirubin 0.2 - 1.2 mg/dL 0.9  Total CHOL/HDL Ratio <5.0 (calc) 3.2  Cholesterol <200 mg/dL 132 (  H)  HDL Cholesterol > OR = 40 mg/dL 65  LDL Cholesterol (Calc) mg/dL (calc) 161 (H)  Non-HDL Cholesterol (Calc) <130 mg/dL (calc) 096 (H)  Triglycerides <150 mg/dL 045 (H)  (H): Data is abnormally high  In office BG 120 mg/dL   ASSESSMENT / PLAN / RECOMMENDATIONS:   1) Type 2 Diabetes Mellitus, Optimally controlled, With Neuropathic  complications - Most recent A1c of 5.5 %. Goal A1c < 7.0 %.     -A1c is optimal at 5.5% -I have praised the patient improved glycemic control, we have opted to discontinue metformin and monitor his glucose    MEDICATIONS: Stop metformin 500 mg XR daily   EDUCATION / INSTRUCTIONS: BG monitoring instructions: Patient is instructed to check his blood sugars 2 times a day. Call [facility] if: BG persistently < 70  I reviewed the Rule of 15 for the treatment of hypoglycemia in detail with the patient. Literature supplied.   2) Diabetic complications:  Eye: Does not have known diabetic retinopathy.  Neuro/ Feet: Does  have known diabetic peripheral neuropathy based on exam 01/08/2022 Renal: Patient does not have known baseline CKD. He is not on an ACEI/ARB at present.  3) Dyslipidemia :  - LDL above goal , he was started on rosuvastatin by PCP in ~07/2021 -LDL remains above goal, patient admits to imperfect adherence to rosuvastatin.  We discussed the cardiovascular benefits of statin  therapy and I have encouraged him to take this consistently in addition to exercise and low-fat diet   Medication Rosuvastatin 5 mg daily   4) Peripheral Neuropathy :  - No sensation in both feet based on foot exam 12/2021, this improved slightly 12/2022 - We had discussed importance of daily inspection of feet  5) Onychomycosis :  -A referral to podiatry has been placed    F/U in 4 months     Signed electronically by: Lyndle Herrlich, MD  [facility]  [facility] [address] [place] [place], [place] [address] Phone: [phone] FAX: [phone]   CC: Avanell Shackleton, NP-C [address] [place] [address] Phone: [phone]  Fax: [phone]    Return to Endocrinology clinic as below: Future Appointments  Date Time Provider Department Center  01/08/2023  8:10 AM Abimelec Grochowski, Konrad Dolores, MD [facility] None  01/13/2023  9:20 AM [facility] 1 [facility]-315CT [facility]  10/12/2023  9:00 AM [facility]  10/26/2023  9:30 AM Comer, Belia Heman, MD [facility]

## 2023-01-08 NOTE — Patient Instructions (Signed)
STOP Metformin    Please take Rosuvastatin 5 mg daily for high cholesterol      HOW TO TREAT LOW BLOOD SUGARS (Blood sugar LESS THAN 70 MG/DL) Please follow the RULE OF 15 for the treatment of hypoglycemia treatment (when your (blood sugars are less than 70 mg/dL)   STEP 1: Take 15 grams of carbohydrates when your blood sugar is low, which includes:  3-4 GLUCOSE TABS  OR 3-4 OZ OF JUICE OR REGULAR SODA OR ONE TUBE OF GLUCOSE GEL    STEP 2: RECHECK blood sugar in 15 MINUTES STEP 3: If your blood sugar is still low at the 15 minute recheck --> then, go back to STEP 1 and treat AGAIN with another 15 grams of carbohydrates.

## 2023-01-13 ENCOUNTER — Ambulatory Visit
Admission: RE | Admit: 2023-01-13 | Discharge: 2023-01-13 | Disposition: A | Discharge status: Home / Self Care | Payer: Commercial Managed Care - HMO | Source: Ambulatory Visit | Attending: Gastroenterology | Admitting: Gastroenterology

## 2023-01-13 MED ORDER — IOPAMIDOL (ISOVUE-370) INJECTION 76%
80.0000 mL | Freq: Once | INTRAVENOUS | Status: AC | PRN
  Administered 2023-01-13: 80 mL via INTRAVENOUS

## 2023-01-14 ENCOUNTER — Ambulatory Visit (INDEPENDENT_AMBULATORY_CARE_PROVIDER_SITE_OTHER): Payer: Commercial Managed Care - HMO | Admitting: Podiatry

## 2023-01-14 ENCOUNTER — Encounter: Payer: Self-pay | Admitting: Podiatry

## 2023-01-14 DIAGNOSIS — B351 Tinea unguium: Secondary | ICD-10-CM | POA: Diagnosis not present

## 2023-01-14 DIAGNOSIS — M79675 Pain in left toe(s): Secondary | ICD-10-CM

## 2023-01-14 DIAGNOSIS — B353 Tinea pedis: Secondary | ICD-10-CM

## 2023-01-14 DIAGNOSIS — M79674 Pain in right toe(s): Secondary | ICD-10-CM | POA: Diagnosis not present

## 2023-01-14 DIAGNOSIS — I739 Peripheral vascular disease, unspecified: Secondary | ICD-10-CM

## 2023-01-14 MED ORDER — CICLOPIROX 8 % EX SOLN: Freq: Every day | CUTANEOUS | 2 refills | Status: AC

## 2023-01-14 MED ORDER — KETOCONAZOLE 2 % EX CREA: 1.0000 | TOPICAL_CREAM | Freq: Every day | CUTANEOUS | 0 refills | Status: AC

## 2023-01-14 NOTE — Patient Instructions (Signed)

## 2023-01-14 NOTE — Progress Notes (Signed)
Subjective:   Patient ID: Robert Ware, male   DOB: 57 y.o.   MRN: [id number]   HPI Chief Complaint  Patient presents with   Nail Problem    RM#13 Patient states having discoloration on both feet and nails X 4 months.    57 y.o. male presents with the above concerns. No open wound. No recent treatment.  Last A1c was 5.5 on 01/08/2023  Endocrinologist: Dr. Rubin Payor, last seen 01/08/2023  Avanell Shackleton, NP-C   Review of Systems  All other systems reviewed and are negative.       Objective:  Physical Exam  General: AAO x3, NAD  Dermatological: Skin is dry bilaterally, peeling skin present.  Nails are hypertrophic, dystrophic with fungal debris present.  There are some darkened discoloration to his feet.  Vascular: DP 2/4 b/l, PT decreased. No varicosities and no lower extremity edema present bilateral. There is no pain with calf compression, swelling, warmth, erythema.  It appears be warm and perfused.  Neruologic: Grossly intact via light touch bilateral.   Musculoskeletal: No gross boney pedal deformities bilateral. No pain, crepitus, or limitation noted with foot and ankle range of motion bilateral. Muscular strength 5/5 in all groups tested bilateral.      Assessment:   58 year old male with tinea pedis, onychomycosis     Plan:  -Treatment options discussed including all alternatives, risks, and complications -Etiology of symptoms were discussed -Need to hold off on oral medication given his other medical conditions.  I prescribed Penlac for his nails as well as ketoconazole for the skin. -Sharply debrided nails x 10 without any complications or bleeding -Ordered ABI given decreased PT pulses b/l  Return in about 3 months (around 04/14/2023).  Vivi Barrack DPM

## 2023-01-19 ENCOUNTER — Other Ambulatory Visit (HOSPITAL_COMMUNITY): Payer: Self-pay

## 2023-01-26 ENCOUNTER — Other Ambulatory Visit: Payer: Self-pay | Admitting: *Deleted

## 2023-01-26 DIAGNOSIS — K7460 Unspecified cirrhosis of liver: Secondary | ICD-10-CM

## 2023-02-11 ENCOUNTER — Encounter (HOSPITAL_COMMUNITY): Payer: Self-pay | Admitting: Emergency Medicine

## 2023-02-11 ENCOUNTER — Emergency Department (HOSPITAL_COMMUNITY)
Admission: EM | Admit: 2023-02-11 | Discharge: 2023-02-12 | Disposition: A | Discharge status: Home / Self Care | Payer: Commercial Managed Care - HMO | Attending: Emergency Medicine | Admitting: Emergency Medicine

## 2023-02-11 ENCOUNTER — Other Ambulatory Visit: Payer: Self-pay

## 2023-02-11 DIAGNOSIS — M542 Cervicalgia: Secondary | ICD-10-CM | POA: Diagnosis present

## 2023-02-11 DIAGNOSIS — Y92410 Unspecified street and highway as the place of occurrence of the external cause: Secondary | ICD-10-CM | POA: Diagnosis not present

## 2023-02-11 DIAGNOSIS — S161XXA Strain of muscle, fascia and tendon at neck level, initial encounter: Secondary | ICD-10-CM | POA: Diagnosis not present

## 2023-02-11 DIAGNOSIS — R251 Tremor, unspecified: Secondary | ICD-10-CM | POA: Insufficient documentation
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
## 2023-02-12 ENCOUNTER — Emergency Department (HOSPITAL_COMMUNITY): Payer: Commercial Managed Care - HMO

## 2023-02-12 MED ORDER — METHOCARBAMOL 500 MG PO TABS
500.0000 mg | ORAL_TABLET | Freq: Once | ORAL | Status: AC
  Administered 2023-02-12: 500 mg via ORAL
  Filled 2023-02-12: qty 1

## 2023-02-12 MED ORDER — KETOROLAC TROMETHAMINE 60 MG/2ML IM SOLN
30.0000 mg | Freq: Once | INTRAMUSCULAR | Status: AC
  Administered 2023-02-12: 30 mg via INTRAMUSCULAR
  Filled 2023-02-12: qty 2

## 2023-02-12 MED ORDER — METHOCARBAMOL 500 MG PO TABS
500.0000 mg | ORAL_TABLET | Freq: Two times a day (BID) | ORAL | 0 refills | Status: AC | PRN
Start: 2023-02-12 — End: ?

## 2023-02-12 MED ORDER — MELOXICAM 15 MG PO TABS
15.0000 mg | ORAL_TABLET | Freq: Every day | ORAL | 0 refills | Status: AC
Start: 2023-02-12 — End: 2023-02-22

## 2023-02-12 NOTE — ED Notes (Signed)
Patient transported to CT 
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
## 2023-02-12 NOTE — ED Notes (Signed)
Discharge instructions reviewed.   Newly prescribed medications discussed. Pharmacy verified for accuracy.   Opportunity for questions and concerns provided.   Alert, oriented and ambulatory. Displays no signs of distress.   Encouraged to follow up with persistent nonimproving symptoms. Declined discharge vital signs.

## 2023-04-03 ENCOUNTER — Other Ambulatory Visit: Payer: Self-pay | Admitting: Gastroenterology

## 2023-04-03 ENCOUNTER — Other Ambulatory Visit (HOSPITAL_COMMUNITY): Payer: Self-pay

## 2023-04-05 ENCOUNTER — Other Ambulatory Visit (HOSPITAL_COMMUNITY): Payer: Self-pay

## 2023-04-05 MED ORDER — PROPRANOLOL HCL 10 MG PO TABS
10.0000 mg | ORAL_TABLET | Freq: Three times a day (TID) | ORAL | 0 refills | Status: DC
  Filled 2023-04-05: qty 270, 90d supply, fill #0

## 2023-04-15 ENCOUNTER — Ambulatory Visit: Payer: Commercial Managed Care - HMO | Admitting: Podiatry

## 2023-05-20 ENCOUNTER — Ambulatory Visit: Payer: Commercial Managed Care - HMO | Admitting: Internal Medicine

## 2023-05-20 ENCOUNTER — Telehealth: Payer: Self-pay

## 2023-05-20 NOTE — Telephone Encounter (Signed)
 Thank you :)

## 2023-05-20 NOTE — Telephone Encounter (Signed)
 Appt canceled. Pt called back with clarification states that he has not missed  a dose of prezista . Has 3 days left. Pharmacy told pt that they do not have refills for Prezista   Spoke with pharmacy who states they have 5 refills on file. Will have rx ready today. Canceled appt with pharmacy.  Julien Odor, RMA

## 2023-05-20 NOTE — Progress Notes (Deleted)
 Name: Robert Ware  MRN/ DOB: [id number], [birth year]   Age/ Sex: 58 y.o., male    PCP: Abram Abraham, NP-C   Reason for Endocrinology Evaluation: Type 2 Diabetes Mellitus     Date of Initial Endocrinology Visit: 01/08/2022    PATIENT IDENTIFIER: Mr. Nahzir Pohle is a 58 y.o. male with a past medical history of DM, HIV, splenomegaly, dyslipidemia, liver cirrhosis. The patient presented for initial endocrinology clinic visit on 01/08/2022 for consultative assistance with his diabetes management.    HPI: Mr. Soward was    Diagnosed with DM in 03/2021          Hemoglobin A1c has ranged from 6.0% in 07/2021, peaking at 13.2% in 03/2021.  On his initial visit to our clinic he had an A1c of 5.8%, he was on metformin  only which we continued   Discontinue metformin  12/2023 with an A1c of 5.5%  SUBJECTIVE:   During the last visit (01/08/2023): A1c 5.5%  Today (07/20/2022): Emmerson Vale is here for follow-up on diabetes management.  He checks his blood sugars 2x daily  . The patient has not had hypoglycemic episodes since the last clinic visit.  Patient continues to follow-up with ID for HIV treatment Patient continues to follow-up with GI for cirrhosis of the liver He had a follow-up with podiatry 01/14/2023   Denies nausea or vomiting  Denies  constipation or diarrhea  No recent blood transfusion  No vitamin intake  Patient has been noted with hyperpigmentation of lower extremities   HOME DIABETES REGIMEN: N/A  Statin: Yes ACE-I/ARB: No   METER DOWNLOAD SUMMARY: did not bring    DIABETIC COMPLICATIONS: Microvascular complications:   Denies: CKD,  Last eye exam: Completed 12/2020  Macrovascular complications:   Denies: CAD, PVD, CVA   PAST HISTORY: Past Medical History:  Past Medical History:  Diagnosis Date   Cirrhosis (HCC)    PT DENIES   Condyloma    ED (erectile dysfunction)    Elevated liver enzymes    GERD (gastroesophageal reflux  disease)    HIV (human immunodeficiency virus infection) (HCC)    Seborrhea    Splenomegaly    Thrombocytopenia (HCC)    Warts    Past Surgical History:  Past Surgical History:  Procedure Laterality Date   BIOPSY  03/21/2020   Procedure: BIOPSY;  Surgeon: Janel Medford, MD;  Location: [facility];  Service: Endoscopy;;   COLONOSCOPY WITH PROPOFOL  N/A 03/21/2020   Procedure: COLONOSCOPY WITH PROPOFOL ;  Surgeon: Janel Medford, MD;  Location: [facility];  Service: Endoscopy;  Laterality: N/A;   CONDYLOMA EXCISION/FULGURATION  2007   CONDYLOMA EXCISION/FULGURATION N/A 07/01/2018   Procedure: CONDYLOMA REMOVAL;  Surgeon: Trent Frizzle, MD;  Location: [facility];  Service: Urology;  Laterality: N/A;   ESOPHAGOGASTRODUODENOSCOPY (EGD) WITH PROPOFOL  N/A 03/21/2020   Procedure: ESOPHAGOGASTRODUODENOSCOPY (EGD) WITH PROPOFOL ;  Surgeon: Janel Medford, MD;  Location: [facility];  Service: Endoscopy;  Laterality: N/A;   HEMOSTASIS CLIP PLACEMENT  03/21/2020   Procedure: HEMOSTASIS CLIP PLACEMENT;  Surgeon: Janel Medford, MD;  [facility]: [facility];  Service: Endoscopy;;   POLYPECTOMY  03/21/2020   Procedure: POLYPECTOMY;  Surgeon: Janel Medford, MD;  Location: [facility];  Service: Endoscopy;;    Social History:  reports that he has never smoked. He has never used smokeless tobacco. He reports that he does not currently use alcohol. He reports that he does not use drugs. Family History:  Family History  Problem Relation Age of Onset  Diabetes Mother    Hypertension Mother    Stroke Father    Colon polyps Neg Hx    Colon cancer Neg Hx    Esophageal cancer Neg Hx    Pancreatic cancer Neg Hx    Liver disease Neg Hx    Stomach cancer Neg Hx      HOME MEDICATIONS: Allergies as of 05/20/2023   No Known Allergies      Medication List        Accurate as of May 20, 2023  6:56 AM. If you have any questions, ask your nurse or doctor.           ciclopirox  8 % solution Commonly known as: PENLAC  Apply topically at bedtime. Apply over nail and surrounding skin. Apply daily over previous coat. After seven (7) days, may remove with alcohol and continue cycle.   Contour Next EZ w/Device Kit Use as directed to check blood glucose levels   Contour Next Test test strip Generic drug: glucose blood Check blood sugar twice a day as instructed by provider.   darunavir  800 MG tablet Commonly known as: PREZISTA  Take 1 tablet (800 mg total) by mouth daily with breakfast.   Genvoya  150-150-200-10 MG Tabs tablet Generic drug: elvitegravir-cobicistat -emtricitabine -tenofovir  Take 1 tablet by mouth daily with breakfast.   ketoconazole  2 % cream Commonly known as: NIZORAL  Apply topically 2 times daily.   ketoconazole  2 % cream Commonly known as: NIZORAL  Apply 1 Application topically daily.   methocarbamol  500 MG tablet Commonly known as: ROBAXIN  Take 1 tablet (500 mg total) by mouth 2 (two) times daily as needed for muscle spasms.   Microlet Lancets Misc Use as directed   propranolol  10 MG tablet Commonly known as: INDERAL  Take 1 tablet  by mouth 3 times daily.   rosuvastatin  5 MG tablet Commonly known as: Crestor  Take 1 tablet (5 mg total) by mouth daily.         ALLERGIES: No Known Allergies   REVIEW OF SYSTEMS: A comprehensive ROS was conducted with the patient and is negative except as per HPI     OBJECTIVE:   VITAL SIGNS: There were no vitals taken for this visit.   PHYSICAL EXAM:  General: Pt appears well and is in NAD  Neck: General: Supple without adenopathy or carotid bruits. Thyroid : Thyroid  size normal.  No goiter or nodules appreciated.   Lungs: Clear with good BS bilat with no rales, rhonchi, or wheezes  Heart: RRR   Abdomen:  soft, nontender  Extremities:  Lower extremities - No pretibial edema. No lesions.  Neuro: MS is good with appropriate affect, pt is alert and Ox3    DM foot exam:  01/08/2023  The skin of the feet is intact sores or ulcerations but has bilateral callus formation and onychomycosis The pedal pulses are 2+ on right and 2+ on left. The sensation is decreased to a screening 5.07, 10 gram monofilament bilaterally   DATA REVIEWED:  Lab Results  Component Value Date   HGBA1C 5.5 01/08/2023   HGBA1C 6.8 (A) 07/20/2022   HGBA1C 5.8 (A) 01/08/2022    Latest Reference Range & Units 11/10/22 08:49  Sodium 135 - 146 mmol/L 138  Potassium 3.5 - 5.3 mmol/L 4.1  Chloride 98 - 110 mmol/L 106  CO2 20 - 32 mmol/L 23  Glucose 65 - 99 mg/dL 161 (H)  BUN 7 - 25 mg/dL 13  Creatinine 0.96 - 0.45 mg/dL 4.09  Calcium  8.6 - 10.3 mg/dL 9.4  BUN/Creatinine Ratio  6 - 22 (calc) SEE NOTE:  eGFR > OR = 60 mL/min/1.40m2 100  AG Ratio 1.0 - 2.5 (calc) 1.0  AST 10 - 35 U/L 87 (H)  ALT 9 - 46 U/L 62 (H)  Total Protein 6.1 - 8.1 g/dL 7.1  Total Bilirubin 0.2 - 1.2 mg/dL 0.9  Total CHOL/HDL Ratio <5.0 (calc) 3.2  Cholesterol <200 mg/dL 161 (H)  HDL Cholesterol > OR = 40 mg/dL 65  LDL Cholesterol (Calc) mg/dL (calc) 096 (H)  Non-HDL Cholesterol (Calc) <130 mg/dL (calc) 045 (H)  Triglycerides <150 mg/dL 409 (H)  (H): Data is abnormally high  In office BG 120 mg/dL   ASSESSMENT / PLAN / RECOMMENDATIONS:   1) Type 2 Diabetes Mellitus, Optimally controlled, With Neuropathic  complications - Most recent A1c of 5.5 %. Goal A1c < 7.0 %.     -A1c is optimal at 5.5% -I have praised the patient improved glycemic control, we have opted to discontinue metformin  and monitor his glucose    MEDICATIONS: Stop metformin  500 mg XR daily   EDUCATION / INSTRUCTIONS: BG monitoring instructions: Patient is instructed to check his blood sugars 2 times a day. Call [facility] if: BG persistently < 70  I reviewed the Rule of 15 for the treatment of hypoglycemia in detail with the patient. Literature supplied.   2) Diabetic complications:  Eye: Does not have known  diabetic retinopathy.  Neuro/ Feet: Does  have known diabetic peripheral neuropathy based on exam 01/08/2022 Renal: Patient does not have known baseline CKD. He is not on an ACEI/ARB at present.  3) Dyslipidemia :  - LDL above goal , he was started on rosuvastatin  by PCP in ~07/2021 -LDL remains above goal, patient admits to imperfect adherence to rosuvastatin .  We discussed the cardiovascular benefits of statin therapy and I have encouraged him to take this consistently in addition to exercise and low-fat diet   Medication Rosuvastatin  5 mg daily   4) Peripheral Neuropathy :  - No sensation in both feet based on foot exam 12/2021, this improved slightly 12/2022 - We had discussed importance of daily inspection of feet  5) Onychomycosis :  -A referral to podiatry has been placed    F/U in 4 months     Signed electronically by: Natale Bail, MD  [facility]  [facility] [address] [place] [place], [place] [address] Phone: [phone] FAX: [phone]   CC: Abram Abraham, NP-C [address] [place] [address] Phone: [phone]  Fax: [phone]    Return to Endocrinology clinic as below: Future Appointments  Date Time Provider Department Center  05/20/2023  8:10 AM Keldric Poyer, Julian Obey, MD [facility] None  10/12/2023  9:00 AM [facility]  10/26/2023  9:30 AM Comer, Judithann Novas, MD [facility]

## 2023-05-20 NOTE — Telephone Encounter (Signed)
 Patient called office today stating he needs a refill on Prezista . Has been off this for 3 weeks now. Has only been taking Genvoya .  Informed patient that since he has been on incomplete regimen he will need to be seen before refills can be called in. Scheduled to pharmacy team tomorrow. Will try to coordinate transportation since he does not have a car at this time Julien Odor, RMA

## 2023-05-20 NOTE — Telephone Encounter (Signed)
 Sounds good thank you

## 2023-05-21 ENCOUNTER — Ambulatory Visit: Admitting: Pharmacist

## 2023-06-08 NOTE — Progress Notes (Signed)
 The 10-year ASCVD risk score (Arnett DK, et al., 2019) is: 13.4%   Values used to calculate the score:     Age: 58 years     Sex: Male     Is Non-Hispanic African American: Yes     Diabetic: Yes     Tobacco smoker: No     Systolic Blood Pressure: 130 mmHg     Is BP treated: No     HDL Cholesterol: 65 mg/dL     Total Cholesterol: 205 mg/dL  Arlon Bergamo, BSN, RN

## 2023-07-19 ENCOUNTER — Encounter: Payer: Self-pay | Admitting: Internal Medicine

## 2023-07-19 ENCOUNTER — Ambulatory Visit (INDEPENDENT_AMBULATORY_CARE_PROVIDER_SITE_OTHER): Admitting: Internal Medicine

## 2023-07-19 VITALS — BP 120/80 | HR 78 | Ht 71.0 in | Wt 226.0 lb

## 2023-07-19 DIAGNOSIS — E1142 Type 2 diabetes mellitus with diabetic polyneuropathy: Secondary | ICD-10-CM | POA: Diagnosis not present

## 2023-07-19 DIAGNOSIS — E785 Hyperlipidemia, unspecified: Secondary | ICD-10-CM

## 2023-07-19 LAB — POCT GLYCOSYLATED HEMOGLOBIN (HGB A1C): Hemoglobin A1C: 5.9 % — AB (ref 4.0–5.6)

## 2023-07-19 LAB — POCT GLUCOSE (DEVICE FOR HOME USE): Glucose Fasting, POC: 135 mg/dL — AB (ref 70–99)

## 2023-07-19 NOTE — Progress Notes (Unsigned)
 Name: Robert Ware  MRN/ DOB: [id number], 08-11-1965   Age/ Sex: 58 y.o., male    PCP: Lendia Boby CROME, NP-C   Reason for Endocrinology Evaluation: Type 2 Diabetes Mellitus     Date of Initial Endocrinology Visit: 01/08/2022    PATIENT IDENTIFIER: [name] is a 58 y.o. male with a past medical history of DM, HIV, splenomegaly, dyslipidemia, liver cirrhosis. The patient presented for initial endocrinology clinic visit on 01/08/2022 for consultative assistance with his diabetes management.    HPI: [name] was    Diagnosed with DM in 03/2021          Hemoglobin A1c has ranged from 6.0% in 07/2021, peaking at 13.2% in 03/2021.  On his initial visit to our clinic he had an A1c of 5.8%, he was on metformin  only which we continued   We discontinue metformin  with an A1c of 5.5% in December, 2024  SUBJECTIVE:   During the last visit (07/20/2022): A1c 5.5%  Today (07/20/2022): Robert Ware is here for follow-up on diabetes management.  He checks his blood sugars occasionally  .   Patient follows with ID for HIV treatment Patient follows with GI for cirrhosis of the liver He was evaluated by podiatry 12/2022, Penlac  was prescribed for his nails as well as ketoconazole   Denies nausea or vomiting  Denies  constipation or diarrhea     HOME DIABETES REGIMEN: N/a  Statin: Yes ACE-I/ARB: No   METER DOWNLOAD SUMMARY: did not bring    DIABETIC COMPLICATIONS: Microvascular complications:   Denies: CKD,  Last eye exam: Completed 2024  Macrovascular complications:   Denies: CAD, PVD, CVA   PAST HISTORY: Past Medical History:  Past Medical History:  Diagnosis Date   Cirrhosis (HCC)    PT DENIES   Condyloma    ED (erectile dysfunction)    Elevated liver enzymes    GERD (gastroesophageal reflux disease)    HIV (human immunodeficiency virus infection) (HCC)    Seborrhea    Splenomegaly    Thrombocytopenia (HCC)    Warts    Past Surgical History:   Past Surgical History:  Procedure Laterality Date   BIOPSY  03/21/2020   Procedure: BIOPSY;  Surgeon: Teressa Toribio SQUIBB, MD;  Location: [facility];  Service: Endoscopy;;   COLONOSCOPY WITH PROPOFOL  N/A 03/21/2020   Procedure: COLONOSCOPY WITH PROPOFOL ;  Surgeon: Teressa Toribio SQUIBB, MD;  Location: [facility];  Service: Endoscopy;  Laterality: N/A;   CONDYLOMA EXCISION/FULGURATION  2007   CONDYLOMA EXCISION/FULGURATION N/A 07/01/2018   Procedure: CONDYLOMA REMOVAL;  Surgeon: Matilda Senior, MD;  Location: [facility];  Service: Urology;  Laterality: N/A;   ESOPHAGOGASTRODUODENOSCOPY (EGD) WITH PROPOFOL  N/A 03/21/2020   Procedure: ESOPHAGOGASTRODUODENOSCOPY (EGD) WITH PROPOFOL ;  Surgeon: Teressa Toribio SQUIBB, MD;  Location: [facility];  Service: Endoscopy;  Laterality: N/A;   HEMOSTASIS CLIP PLACEMENT  03/21/2020   Procedure: HEMOSTASIS CLIP PLACEMENT;  Surgeon: Teressa Toribio SQUIBB, MD;  Location: [facility];  Service: Endoscopy;;   POLYPECTOMY  03/21/2020   Procedure: POLYPECTOMY;  Surgeon: Teressa Toribio SQUIBB, MD;  Location: [facility];  Service: Endoscopy;;    Social History:  reports that he has never smoked. He has never used smokeless tobacco. He reports that he does not currently use alcohol. He reports that he does not use drugs. Family History:  Family History  Problem Relation Age of Onset   Diabetes Mother    Hypertension Mother    Stroke Father    Colon polyps Neg Hx  Colon cancer Neg Hx    Esophageal cancer Neg Hx    Pancreatic cancer Neg Hx    Liver disease Neg Hx    Stomach cancer Neg Hx      HOME MEDICATIONS: Allergies as of 07/19/2023   No Known Allergies      Medication List        Accurate as of July 19, 2023  8:19 AM. If you have any questions, ask your nurse or doctor.          ciclopirox  8 % solution Commonly known as: PENLAC  Apply topically at bedtime. Apply over nail and surrounding skin. Apply daily over previous coat. After seven  (7) days, may remove with alcohol and continue cycle.   Contour Next EZ w/Device Kit Use as directed to check blood glucose levels   Contour Next Test test strip Generic drug: glucose blood Check blood sugar twice a day as instructed by provider.   darunavir  800 MG tablet Commonly known as: PREZISTA  Take 1 tablet (800 mg total) by mouth daily with breakfast.   Genvoya  150-150-200-10 MG Tabs tablet Generic drug: elvitegravir-cobicistat -emtricitabine -tenofovir  Take 1 tablet by mouth daily with breakfast.   ketoconazole  2 % cream Commonly known as: NIZORAL  Apply topically 2 times daily.   ketoconazole  2 % cream Commonly known as: NIZORAL  Apply 1 Application topically daily.   methocarbamol  500 MG tablet Commonly known as: ROBAXIN  Take 1 tablet (500 mg total) by mouth 2 (two) times daily as needed for muscle spasms.   Microlet Lancets Misc Use as directed   propranolol  10 MG tablet Commonly known as: INDERAL  Take 1 tablet  by mouth 3 times daily.   rosuvastatin  5 MG tablet Commonly known as: Crestor  Take 1 tablet (5 mg total) by mouth daily.   traMADol 50 MG tablet Commonly known as: ULTRAM Take 50 mg by mouth every 6 (six) hours as needed.         ALLERGIES: No Known Allergies   REVIEW OF SYSTEMS: A comprehensive ROS was conducted with the patient and is negative except as per HPI     OBJECTIVE:   VITAL SIGNS: BP 120/80 (BP Location: Left Arm, Patient Position: Sitting, Cuff Size: Normal)   Pulse 78   Ht 5' 11 (1.803 m)   Wt 226 lb (102.5 kg)   SpO2 96%   BMI 31.52 kg/m    PHYSICAL EXAM:  General: Pt appears well and is in NAD  Neck: General: Supple without adenopathy or carotid bruits. Thyroid : Thyroid  size normal.  No goiter or nodules appreciated.   Lungs: Clear with good BS bilat with no rales, rhonchi, or wheezes  Heart: RRR   Abdomen:  soft, nontender  Extremities:  Lower extremities - No pretibial edema. No lesions.  Neuro: MS is good  with appropriate affect, pt is alert and Ox3    DM foot exam: 01/08/2023  The skin of the feet is intact sores or ulcerations but has bilateral callus formation and onychomycosis The pedal pulses are 2+ on right and 2+ on left. The sensation is decreased to a screening 5.07, 10 gram monofilament bilaterally   DATA REVIEWED:  Lab Results  Component Value Date   HGBA1C 5.9 (A) 07/19/2023   HGBA1C 5.5 01/08/2023   HGBA1C 6.8 (A) 07/20/2022    Latest Reference Range & Units 11/10/22 08:49  Sodium 135 - 146 mmol/L 138  Potassium 3.5 - 5.3 mmol/L 4.1  Chloride 98 - 110 mmol/L 106  CO2 20 - 32 mmol/L 23  Glucose 65 - 99 mg/dL 883 (H)  BUN 7 - 25 mg/dL 13  Creatinine 9.29 - 8.69 mg/dL 9.13  Calcium  8.6 - 10.3 mg/dL 9.4  BUN/Creatinine Ratio 6 - 22 (calc) SEE NOTE:  eGFR > OR = 60 mL/min/1.18m2 100  AG Ratio 1.0 - 2.5 (calc) 1.0  AST 10 - 35 U/L 87 (H)  ALT 9 - 46 U/L 62 (H)  Total Protein 6.1 - 8.1 g/dL 7.1  Total Bilirubin 0.2 - 1.2 mg/dL 0.9  Total CHOL/HDL Ratio <5.0 (calc) 3.2  Cholesterol <200 mg/dL 794 (H)  HDL Cholesterol > OR = 40 mg/dL 65  LDL Cholesterol (Calc) mg/dL (calc) 895 (H)  Non-HDL Cholesterol (Calc) <130 mg/dL (calc) 859 (H)  Triglycerides <150 mg/dL 761 (H)  (H): Data is abnormally high  In office BG 120 mg/dL   ASSESSMENT / PLAN / RECOMMENDATIONS:   1) Type 2 Diabetes Mellitus, Optimally controlled, With Neuropathic  complications - Most recent A1c of 5.5 %. Goal A1c < 7.0 %.     -A1c is optimal at 5.5% -I have praised the patient improved glycemic control, we have opted to discontinue metformin  and monitor his glucose    MEDICATIONS: Stop metformin  500 mg XR daily   EDUCATION / INSTRUCTIONS: BG monitoring instructions: Patient is instructed to check his blood sugars 2 times a day. Call [facility] if: BG persistently < 70  I reviewed the Rule of 15 for the treatment of hypoglycemia in detail with the patient. Literature  supplied.   2) Diabetic complications:  Eye: Does not have known diabetic retinopathy.  Neuro/ Feet: Does  have known diabetic peripheral neuropathy based on exam 01/08/2022 Renal: Patient does not have known baseline CKD. He is not on an ACEI/ARB at present.  3) Dyslipidemia :  - He  was started on rosuvastatin  by PCP in ~07/2021 -LDL remains above goal, patient admits to imperfect adherence to rosuvastatin .  We discussed the cardiovascular benefits of statin therapy and I have encouraged him to take this consistently in addition to exercise and low-fat diet   Medication Rosuvastatin  5 mg daily   4) Peripheral Neuropathy :  - No sensation in both feet based on foot exam 12/2021, this improved slightly 12/2022 - We had discussed importance of daily inspection of feet    F/U in 4 months     Signed electronically by: Stefano Redgie Butts, MD  [facility]  [facility] [address] [place] [place], [place] [address] Phone: [phone] FAX: [phone]   CC: Lendia Boby CROME, NP-C [address] [place] [address] Phone: [phone]  Fax: [phone]    Return to Endocrinology clinic as below: Future Appointments  Date Time Provider Department Center  10/12/2023  9:00 AM [facility]  10/26/2023  8:45 AM Calone, Gregory D, FNP [facility]

## 2023-07-20 ENCOUNTER — Ambulatory Visit: Payer: Self-pay | Admitting: Internal Medicine

## 2023-07-20 LAB — LIPID PANEL
Cholesterol: 170 mg/dL (ref <200)
HDL: 48 mg/dL (ref >=40)
LDL Cholesterol (Calc): 106 mg/dL — ABNORMAL HIGH
Non-HDL Cholesterol (Calc): 122 mg/dL (ref <130)
Total CHOL/HDL Ratio: 3.5 (calc) (ref <5.0)
Triglycerides: 74 mg/dL (ref <150)

## 2023-07-20 LAB — BASIC METABOLIC PANEL WITH GFR
BUN/Creatinine Ratio: 12 (calc) (ref 6–22)
BUN: 7 mg/dL (ref 7–25)
CO2: 24 mmol/L (ref 20–32)
Calcium: 8.7 mg/dL (ref 8.6–10.3)
Chloride: 107 mmol/L (ref 98–110)
Creat: 0.58 mg/dL — ABNORMAL LOW (ref 0.70–1.30)
Glucose, Bld: 123 mg/dL — ABNORMAL HIGH (ref 65–99)
Potassium: 3.7 mmol/L (ref 3.5–5.3)
Sodium: 137 mmol/L (ref 135–146)
eGFR: 112 mL/min/{1.73_m2} (ref >=60)

## 2023-07-20 LAB — MICROALBUMIN / CREATININE URINE RATIO
Creatinine, Urine: 120 mg/dL (ref 20–320)
Microalb Creat Ratio: 5 mg/g{creat} (ref <30)
Microalb, Ur: 0.6 mg/dL

## 2023-07-20 MED ORDER — ROSUVASTATIN CALCIUM 10 MG PO TABS: 10.0000 mg | ORAL_TABLET | Freq: Every day | ORAL | 3 refills | Status: AC

## 2023-07-28 ENCOUNTER — Telehealth: Payer: Self-pay

## 2023-07-28 DIAGNOSIS — K7460 Unspecified cirrhosis of liver: Secondary | ICD-10-CM

## 2023-07-28 NOTE — Telephone Encounter (Signed)
 The pt has been advised of the need for US  and labs He will come in as able.  Orders entered for labs and US  order sent to the schedulers to set up.

## 2023-07-28 NOTE — Telephone Encounter (Signed)
-----   Message from Nurse Dannie Woolen P sent at 01/28/2023  1:12 PM EST ----- 16-month follow-up HCC screening with liver ultrasound and AFP.  He will need MELD labs to be ordered as well.  Hopefully he will have a follow-up before or after the [facility] screening with PA Zehr or myself.  Thanks.  GM

## 2023-08-27 ENCOUNTER — Ambulatory Visit (HOSPITAL_COMMUNITY)
Admission: RE | Admit: 2023-08-27 | Discharge: 2023-08-27 | Disposition: A | Discharge status: Home / Self Care | Source: Ambulatory Visit | Attending: Gastroenterology | Admitting: Gastroenterology

## 2023-08-27 DIAGNOSIS — K7581 Nonalcoholic steatohepatitis (NASH): Secondary | ICD-10-CM | POA: Insufficient documentation

## 2023-08-27 DIAGNOSIS — K7460 Unspecified cirrhosis of liver: Secondary | ICD-10-CM | POA: Insufficient documentation

## 2023-09-02 ENCOUNTER — Other Ambulatory Visit: Payer: Self-pay

## 2023-09-02 ENCOUNTER — Ambulatory Visit

## 2023-09-05 ENCOUNTER — Ambulatory Visit: Payer: Self-pay | Admitting: Gastroenterology

## 2023-10-04 ENCOUNTER — Other Ambulatory Visit: Payer: Self-pay

## 2023-10-04 DIAGNOSIS — Z79899 Other long term (current) drug therapy: Secondary | ICD-10-CM

## 2023-10-04 DIAGNOSIS — B20 Human immunodeficiency virus [HIV] disease: Secondary | ICD-10-CM

## 2023-10-04 DIAGNOSIS — Z113 Encounter for screening for infections with a predominantly sexual mode of transmission: Secondary | ICD-10-CM

## 2023-10-12 ENCOUNTER — Other Ambulatory Visit (HOSPITAL_COMMUNITY)
Admission: RE | Admit: 2023-10-12 | Discharge: 2023-10-12 | Disposition: A | Discharge status: Home / Self Care | Source: Ambulatory Visit | Attending: Family | Admitting: Family

## 2023-10-12 ENCOUNTER — Other Ambulatory Visit: Payer: Managed Care, Other (non HMO)

## 2023-10-12 ENCOUNTER — Other Ambulatory Visit: Payer: Self-pay

## 2023-10-12 DIAGNOSIS — Z79899 Other long term (current) drug therapy: Secondary | ICD-10-CM

## 2023-10-12 DIAGNOSIS — Z113 Encounter for screening for infections with a predominantly sexual mode of transmission: Secondary | ICD-10-CM | POA: Insufficient documentation

## 2023-10-12 DIAGNOSIS — B20 Human immunodeficiency virus [HIV] disease: Secondary | ICD-10-CM

## 2023-10-13 LAB — URINE CYTOLOGY ANCILLARY ONLY
Chlamydia: NEGATIVE
Comment: NEGATIVE
Comment: NORMAL
Neisseria Gonorrhea: NEGATIVE

## 2023-10-14 LAB — T-HELPER CELL (CD4) - (RCID CLINIC ONLY)
CD4 % Helper T Cell: 35 % (ref 33–65)
CD4 T Cell Abs: 345 /uL — ABNORMAL LOW (ref 400–1790)

## 2023-10-15 LAB — COMPLETE METABOLIC PANEL WITHOUT GFR
AG Ratio: 1.1 (calc) (ref 1.0–2.5)
ALT: 71 U/L — ABNORMAL HIGH (ref 9–46)
AST: 142 U/L — ABNORMAL HIGH (ref 10–35)
Albumin: 3.8 g/dL (ref 3.6–5.1)
Alkaline phosphatase (APISO): 96 U/L (ref 35–144)
BUN: 10 mg/dL (ref 7–25)
CO2: 26 mmol/L (ref 20–32)
Calcium: 8.8 mg/dL (ref 8.6–10.3)
Chloride: 104 mmol/L (ref 98–110)
Creat: 0.74 mg/dL (ref 0.70–1.30)
Globulin: 3.6 g/dL (ref 1.9–3.7)
Glucose, Bld: 139 mg/dL — ABNORMAL HIGH (ref 65–99)
Potassium: 3.9 mmol/L (ref 3.5–5.3)
Sodium: 139 mmol/L (ref 135–146)
Total Bilirubin: 1.4 mg/dL — ABNORMAL HIGH (ref 0.2–1.2)
Total Protein: 7.4 g/dL (ref 6.1–8.1)

## 2023-10-15 LAB — LIPID PANEL
Cholesterol: 202 mg/dL — ABNORMAL HIGH (ref <200)
HDL: 57 mg/dL (ref >=40)
LDL Cholesterol (Calc): 124 mg/dL — ABNORMAL HIGH
Non-HDL Cholesterol (Calc): 145 mg/dL — ABNORMAL HIGH (ref <130)
Total CHOL/HDL Ratio: 3.5 (calc) (ref <5.0)
Triglycerides: 103 mg/dL (ref <150)

## 2023-10-15 LAB — CBC WITH DIFFERENTIAL/PLATELET
Absolute Lymphocytes: 1268 {cells}/uL (ref 850–3900)
Absolute Monocytes: 313 {cells}/uL (ref 200–950)
Basophils Absolute: 19 {cells}/uL (ref 0–200)
Basophils Relative: 0.6 %
Eosinophils Absolute: 40 {cells}/uL (ref 15–500)
Eosinophils Relative: 1.3 %
HCT: 47.6 % (ref 38.5–50.0)
Hemoglobin: 16.2 g/dL (ref 13.2–17.1)
MCH: 29.8 pg (ref 27.0–33.0)
MCHC: 34 g/dL (ref 32.0–36.0)
MCV: 87.5 fL (ref 80.0–100.0)
Monocytes Relative: 10.1 %
Neutro Abs: 1460 {cells}/uL — ABNORMAL LOW (ref 1500–7800)
Neutrophils Relative %: 47.1 %
Platelets: 21 Thousand/uL — ABNORMAL LOW (ref 140–400)
RBC: 5.44 Million/uL (ref 4.20–5.80)
RDW: 17.7 % — ABNORMAL HIGH (ref 11.0–15.0)
Total Lymphocyte: 40.9 %
WBC: 3.1 Thousand/uL — ABNORMAL LOW (ref 3.8–10.8)

## 2023-10-15 LAB — RPR: RPR Ser Ql: NONREACTIVE

## 2023-10-15 LAB — HIV-1 RNA QUANT-NO REFLEX-BLD
HIV 1 RNA Quant: NOT DETECTED {copies}/mL
HIV-1 RNA Quant, Log: NOT DETECTED {Log_copies}/mL

## 2023-10-26 ENCOUNTER — Encounter: Payer: Self-pay | Admitting: Family

## 2023-10-26 ENCOUNTER — Ambulatory Visit: Payer: Self-pay | Admitting: Internal Medicine

## 2023-10-26 ENCOUNTER — Ambulatory Visit: Admitting: Family

## 2023-10-26 ENCOUNTER — Other Ambulatory Visit: Payer: Self-pay

## 2023-10-26 VITALS — BP 159/107 | HR 71 | Temp 97.6°F | Ht 74.0 in | Wt 232.0 lb

## 2023-10-26 DIAGNOSIS — B20 Human immunodeficiency virus [HIV] disease: Secondary | ICD-10-CM | POA: Diagnosis not present

## 2023-10-26 DIAGNOSIS — K7460 Unspecified cirrhosis of liver: Secondary | ICD-10-CM | POA: Diagnosis not present

## 2023-10-26 DIAGNOSIS — Z23 Encounter for immunization: Secondary | ICD-10-CM | POA: Diagnosis not present

## 2023-10-26 DIAGNOSIS — K7581 Nonalcoholic steatohepatitis (NASH): Secondary | ICD-10-CM

## 2023-10-26 MED ORDER — GENVOYA 150-150-200-10 MG PO TABS
1.0000 | ORAL_TABLET | Freq: Every day | ORAL | 11 refills | Status: AC
Start: 2023-10-26 — End: ?

## 2023-10-26 MED ORDER — DARUNAVIR 800 MG PO TABS
800.0000 mg | ORAL_TABLET | Freq: Every day | ORAL | 11 refills | Status: AC
Start: 2023-10-26 — End: ?

## 2023-10-26 NOTE — Progress Notes (Signed)
 Brief Narrative   Patient ID: Robert Ware, male    DOB: 03/25/1965, 58 y.o.   MRN: [id number]  Mr. Persons is a 58 y/o AA male diagnosed with HIV-1 disease in 1995 with risk factor of heterosexual contact. Initial viral load was 83,362 with CD4 count 220. Entered care at [facility] Stage 2. No history of opportunistic infection. ART experienced with Tivicay , darunivir/ritonavir , and Genvoya /Prezista . Genotypes with M184V, M46L, I54V, V82A, L90M, M41L, E44D, L210W, and T215Y.   Subjective:   Chief Complaint  Patient presents with   Follow-up    B20    HPI:  Robert Ware is a 58 y.o. male with HIV disease last seen by Dr. Efrain on 11/24/22 with well-controlled virus and good adherence and tolerance to Prezista  and Genvoya .  Viral load was undetectable with CD4 count 298.  Most recent lab work completed on 10/12/2023 with viral load that remains undetectable and CD4 count 345.  Urine was negative for gonorrhea and chlamydia.  RPR nonreactive for syphilis.  Kidney function within normal ranges.  AST elevated at 142 and ALT 71. Follows with [facility] for cirrhosis. Here today for follow up.   Mr. Higbie has been doing well since his last office visit and continues to take Prezista  and Genvoya  as prescribed but no adverse side effects or problems obtaining medication from the pharmacy.  Covered by [facility].  No new concerns/complaints.  Housing, transportation, and access to food are stable.  Healthcare maintenance reviewed.  Not currently sexually active.  Routine dental care up-to-date.  Denies fevers, chills, night sweats, headaches, changes in vision, neck pain/stiffness, nausea, diarrhea, vomiting, lesions or rashes.    Lab Results  Component Value Date   CD4TCELL 35 10/12/2023   CD4TABS 345 (L) 10/12/2023   Lab Results  Component Value Date   HIV1RNAQUANT NOT DETECTED 10/12/2023     No Known Allergies    Outpatient Medications Prior to Visit  Medication Sig Dispense Refill    Blood Glucose Monitoring Suppl (CONTOUR NEXT EZ) w/Device KIT Use as directed to check blood glucose levels 1 kit 0   glucose blood (CONTOUR NEXT TEST) test strip Check blood sugar twice a day as instructed by provider. 100 each 3   ketoconazole  (NIZORAL ) 2 % cream Apply topically 2 times daily. 60 g 0   Microlet Lancets MISC Use as directed 100 each 3   propranolol  (INDERAL ) 10 MG tablet Take 1 tablet  by mouth 3 times daily. 270 tablet 0   rosuvastatin  (CRESTOR ) 10 MG tablet Take 1 tablet (10 mg total) by mouth daily. 90 tablet 3   darunavir  (PREZISTA ) 800 MG tablet Take 1 tablet (800 mg total) by mouth daily with breakfast. 30 tablet 11   elvitegravir-cobicistat -emtricitabine -tenofovir  (GENVOYA ) 150-150-200-10 MG TABS tablet Take 1 tablet by mouth daily with breakfast. 30 tablet 11   ciclopirox  (PENLAC ) 8 % solution Apply topically at bedtime. Apply over nail and surrounding skin. Apply daily over previous coat. After seven (7) days, may remove with alcohol and continue cycle. (Patient not taking: Reported on 10/26/2023) 6.6 mL 2   ketoconazole  (NIZORAL ) 2 % cream Apply 1 Application topically daily. (Patient not taking: Reported on 10/26/2023) 60 g 0   methocarbamol  (ROBAXIN ) 500 MG tablet Take 1 tablet (500 mg total) by mouth 2 (two) times daily as needed for muscle spasms. (Patient not taking: Reported on 10/26/2023) 20 tablet 0   traMADol (ULTRAM) 50 MG tablet Take 50 mg by mouth every 6 (six) hours as needed. (Patient  not taking: Reported on 10/26/2023)     No facility-administered medications prior to visit.     Past Medical History:  Diagnosis Date   Cirrhosis [facility])    PT DENIES   Condyloma    ED (erectile dysfunction)    Elevated liver enzymes    GERD (gastroesophageal reflux disease)    HIV (human immunodeficiency virus infection) (HCC)    Seborrhea    Splenomegaly    Thrombocytopenia    Warts      Past Surgical History:  Procedure Laterality Date   BIOPSY  03/21/2020    Procedure: BIOPSY;  Surgeon: Teressa Toribio SQUIBB, MD;  Location: [facility];  Service: Endoscopy;;   COLONOSCOPY WITH PROPOFOL  N/A 03/21/2020   Procedure: COLONOSCOPY WITH PROPOFOL ;  Surgeon: Teressa Toribio SQUIBB, MD;  Location: [facility];  Service: Endoscopy;  Laterality: N/A;   CONDYLOMA EXCISION/FULGURATION  2007   CONDYLOMA EXCISION/FULGURATION N/A 07/01/2018   Procedure: CONDYLOMA REMOVAL;  Surgeon: Matilda Senior, MD;  Location: [facility];  Service: Urology;  Laterality: N/A;   ESOPHAGOGASTRODUODENOSCOPY (EGD) WITH PROPOFOL  N/A 03/21/2020   Procedure: ESOPHAGOGASTRODUODENOSCOPY (EGD) WITH PROPOFOL ;  Surgeon: Teressa Toribio SQUIBB, MD;  [facility]: [facility];  Service: Endoscopy;  Laterality: N/A;   HEMOSTASIS CLIP PLACEMENT  03/21/2020   Procedure: HEMOSTASIS CLIP PLACEMENT;  Surgeon: Teressa Toribio SQUIBB, MD;  [facility]: [facility];  Service: Endoscopy;;   POLYPECTOMY  03/21/2020   Procedure: POLYPECTOMY;  Surgeon: Teressa Toribio SQUIBB, MD;  Location: [facility];  Service: Endoscopy;;        Review of Systems  Constitutional:  Negative for appetite change, chills, fatigue, fever and unexpected weight change.  Eyes:  Negative for visual disturbance.  Respiratory:  Negative for cough, chest tightness, shortness of breath and wheezing.   Cardiovascular:  Negative for chest pain and leg swelling.  Gastrointestinal:  Negative for abdominal pain, constipation, diarrhea, nausea and vomiting.  Genitourinary:  Negative for dysuria, flank pain, frequency, genital sores, hematuria and urgency.  Skin:  Negative for rash.  Allergic/Immunologic: Negative for immunocompromised state.  Neurological:  Negative for dizziness and headaches.     Objective:   BP (!) 159/107   Pulse 71   Temp 97.6 F (36.4 C) (Temporal)   Ht 6' 2 (1.88 m)   Wt 232 lb (105.2 kg)   SpO2 96%   BMI 29.79 kg/m  Nursing note and vital signs reviewed.  Physical Exam Constitutional:      General: He is  not in acute distress.    Appearance: He is well-developed.  Eyes:     Conjunctiva/sclera: Conjunctivae normal.  Cardiovascular:     Rate and Rhythm: Normal rate and regular rhythm.     Heart sounds: Normal heart sounds. No murmur heard.    No friction rub. No gallop.  Pulmonary:     Effort: Pulmonary effort is normal. No respiratory distress.     Breath sounds: Normal breath sounds. No wheezing or rales.  Chest:     Chest wall: No tenderness.  Abdominal:     General: Bowel sounds are normal.     Palpations: Abdomen is soft.     Tenderness: There is no abdominal tenderness.  Musculoskeletal:     Cervical back: Neck supple.  Lymphadenopathy:     Cervical: No cervical adenopathy.  Skin:    General: Skin is warm and dry.     Findings: No rash.  Neurological:     Mental Status: He is alert and oriented to person, place, and time.  Psychiatric:        Mood and Affect: Mood normal.          10/26/2023    9:02 AM 11/24/2022    8:53 AM 11/26/2021    9:15 AM 07/30/2021    8:47 AM 04/30/2021    8:19 AM  Depression screen PHQ 2/9  Decreased Interest 0 0 0 0 0  Down, Depressed, Hopeless 0 0 0 0 0  PHQ - 2 Score 0 0 0 0 0  Altered sleeping 0      Tired, decreased energy 0      Change in appetite 0      Feeling bad or failure about yourself  0      Trouble concentrating 0      Moving slowly or fidgety/restless 0      Suicidal thoughts 0      PHQ-9 Score 0      Difficult doing work/chores Not difficult at all            10/26/2023    9:02 AM  GAD 7 : Generalized Anxiety Score  Nervous, Anxious, on Edge 0  Control/stop worrying 0  Worry too much - different things 0  Trouble relaxing 0  Restless 1  Easily annoyed or irritable 1  Afraid - awful might happen 0  Total GAD 7 Score 2  Anxiety Difficulty --     The 10-year ASCVD risk score (Arnett DK, et al., 2019) is: 20%   Values used to calculate the score:     Age: 51 years     Clincally relevant sex: Male     Is  Non-Hispanic African American: Yes     Diabetic: Yes     Tobacco smoker: No     Systolic Blood Pressure: 159 mmHg     Is BP treated: No     HDL Cholesterol: 57 mg/dL     Total Cholesterol: 202 mg/dL      Assessment & Plan:    Patient Active Problem List   Diagnosis Date Noted   Onychomycosis 01/08/2023   Polyneuropathy associated with underlying disease 01/08/2023   Dyslipidemia 01/08/2023   Routine screening for STI (sexually transmitted infection) 11/24/2022   Type 2 diabetes mellitus without complication, without long-term current use of insulin (HCC) 01/08/2022   Type 2 diabetes mellitus with diabetic polyneuropathy, without long-term current use of insulin (HCC) 01/08/2022   Dysuria 08/28/2021   Pain in both testicles 08/28/2021   Epididymitis, right 08/28/2021   Pure hypercholesterolemia 07/30/2021   New onset type 2 diabetes mellitus (HCC) 07/30/2021   Obesity (BMI 30.0-34.9) 04/15/2021   H. pylori infection 11/20/2020   COVID 09/05/2020   Rash 11/22/2019   Liver cirrhosis secondary to NASH (nonalcoholic steatohepatitis) (HCC) 02/25/2017   Splenomegaly 02/25/2017   Elevated liver enzymes 06/14/2014   SEBORRHEA 04/17/2008   Human immunodeficiency virus (HIV) disease (HCC) 02/06/2006   Genital warts 02/06/2006   Thrombocytopenia (HCC) 02/06/2006   ERECTILE DYSFUNCTION 02/06/2006   GERD 02/06/2006     Problem List Items Addressed This Visit       Digestive   Liver cirrhosis secondary to NASH (nonalcoholic steatohepatitis) (HCC)   Following with GI. Currently estimated at Child Pugh Class A. Will continue to monitor to determine any need to changes to ART.         Other   Human immunodeficiency virus (HIV) disease (HCC) - Primary   Mr. Langhorst continues to have well-controlled virus with good adherence and tolerance to  Genvoya  and Prezista .  Reviewed lab work and discussed plan of care and U equals U.  No problems obtaining medication from the pharmacy and  covered by [facility].  Social determinants of health reviewed with no interventions indicated.  Continue current dose of Genvoya  and Prezista .  Plan for follow-up in 9 months or sooner if needed with lab work 1 to 2 weeks prior to appointment.      Relevant Medications   darunavir  (PREZISTA ) 800 MG tablet   elvitegravir-cobicistat -emtricitabine -tenofovir  (GENVOYA ) 150-150-200-10 MG TABS tablet   Other Relevant Orders   Flu vaccine trivalent PF, 6mos and older(Flulaval,Afluria,Fluarix,Fluzone) (Completed)   Comprehensive metabolic panel with GFR   HIV-1 RNA quant-no reflex-bld   T-helper cell (CD4)- ([facility] clinic only)   CBC with Differential/Platelet   Other Visit Diagnoses       Need for influenza vaccination       Relevant Orders   Flu vaccine trivalent PF, 6mos and older(Flulaval,Afluria,Fluarix,Fluzone) (Completed)   Protime-INR        I am having Marios Hird maintain his Contour Next Test, Microlet Lancets, Contour Next EZ, ketoconazole , ciclopirox , ketoconazole , methocarbamol , propranolol , traMADol, rosuvastatin , darunavir , and Genvoya .   Meds ordered this encounter  Medications   darunavir  (PREZISTA ) 800 MG tablet    Sig: Take 1 tablet (800 mg total) by mouth daily with breakfast.    Dispense:  30 tablet    Refill:  11    Fill with Genvoya     Supervising Provider:   LUIZ CHANNEL [phone]    Prescription Type::   Renewal   elvitegravir-cobicistat -emtricitabine -tenofovir  (GENVOYA ) 150-150-200-10 MG TABS tablet    Sig: Take 1 tablet by mouth daily with breakfast.    Dispense:  30 tablet    Refill:  11    Fill with Pifeltro    Supervising Provider:   LUIZ CHANNEL [phone]    Prescription Type::   Renewal     Follow-up: Return in about 9 months (around 07/25/2024). or sooner if needed.    Cathlyn July, MSN, FNP-C Nurse Practitioner [facility] Main number: [phone]

## 2023-10-26 NOTE — Patient Instructions (Addendum)
Nice to see you.  Continue to take your medication daily as prescribed.  Refills have been sent to the pharmacy.  Plan for follow up in 9 months or sooner if needed with lab work 1-2 weeks prior to appointment.   Have a great day and stay safe!

## 2023-10-26 NOTE — Assessment & Plan Note (Signed)
 Mr. Albee continues to have well-controlled virus with good adherence and tolerance to Genvoya  and Prezista .  Reviewed lab work and discussed plan of care and U equals U.  No problems obtaining medication from the pharmacy and covered by [facility].  Social determinants of health reviewed with no interventions indicated.  Continue current dose of Genvoya  and Prezista .  Plan for follow-up in 9 months or sooner if needed with lab work 1 to 2 weeks prior to appointment.

## 2023-10-26 NOTE — Assessment & Plan Note (Signed)
 Following with GI. Currently estimated at Child Pugh Class A. Will continue to monitor to determine any need to changes to ART.

## 2024-01-04 ENCOUNTER — Other Ambulatory Visit (HOSPITAL_COMMUNITY): Payer: Self-pay

## 2024-01-04 ENCOUNTER — Other Ambulatory Visit: Payer: Self-pay | Admitting: Family Medicine

## 2024-01-05 ENCOUNTER — Other Ambulatory Visit (HOSPITAL_COMMUNITY): Payer: Self-pay

## 2024-01-05 MED ORDER — PROPRANOLOL HCL 10 MG PO TABS
10.0000 mg | ORAL_TABLET | Freq: Three times a day (TID) | ORAL | 0 refills | Status: AC
  Filled 2024-01-05 – 2024-01-06 (×3): qty 270, 90d supply, fill #0

## 2024-01-06 ENCOUNTER — Other Ambulatory Visit: Payer: Self-pay

## 2024-01-07 ENCOUNTER — Other Ambulatory Visit (HOSPITAL_COMMUNITY): Payer: Self-pay

## 2024-01-28 ENCOUNTER — Ambulatory Visit: Admitting: Internal Medicine

## 2024-01-28 NOTE — Progress Notes (Deleted)
 " Name: Robert Ware  MRN/ DOB: [id number], 1965-12-14   Age/ Sex: 59 y.o., male    PCP: Lendia Boby CROME, NP-C   Reason for Endocrinology Evaluation: Type 2 Diabetes Mellitus     Date of Initial Endocrinology Visit: 01/08/2022    PATIENT IDENTIFIER: Robert Ware is a 59 y.o. male with a past medical history of DM, HIV, splenomegaly, dyslipidemia, liver cirrhosis. The patient presented for initial endocrinology clinic visit on 01/08/2022 for consultative assistance with his diabetes management.    HPI: [name] was    Diagnosed with DM in 03/2021          Hemoglobin A1c has ranged from 6.0% in 07/2021, peaking at 13.2% in 03/2021.  On his initial visit to our clinic he had an A1c of 5.8%, he was on metformin  only which we continued   We discontinue metformin  with an A1c of 5.5% in December, 2024  SUBJECTIVE:   During the last visit (07/19/2023): A1c 5.5%  Today (07/20/2022): Robert Ware is here for follow-up on diabetes management.  He checks his blood sugars occasionally  .   Patient follows with ID for HIV treatment Patient follows with GI for cirrhosis of the liver  Denies nausea or vomiting  Denies  constipation or diarrhea     HOME DIABETES REGIMEN: N/a  Statin: Yes ACE-I/ARB: No   METER DOWNLOAD SUMMARY: did not bring    DIABETIC COMPLICATIONS: Microvascular complications:   Denies: CKD,  Last eye exam: Completed 2024  Macrovascular complications:   Denies: CAD, PVD, CVA   PAST HISTORY: Past Medical History:  Past Medical History:  Diagnosis Date   Cirrhosis (HCC)    PT DENIES   Condyloma    ED (erectile dysfunction)    Elevated liver enzymes    GERD (gastroesophageal reflux disease)    HIV (human immunodeficiency virus infection) (HCC)    Seborrhea    Splenomegaly    Thrombocytopenia    Warts    Past Surgical History:  Past Surgical History:  Procedure Laterality Date   BIOPSY  03/21/2020   Procedure: BIOPSY;  Surgeon:  Teressa Toribio SQUIBB, MD;  Location: [facility];  Service: Endoscopy;;   COLONOSCOPY WITH PROPOFOL  N/A 03/21/2020   Procedure: COLONOSCOPY WITH PROPOFOL ;  Surgeon: Teressa Toribio SQUIBB, MD;  Location: [facility];  Service: Endoscopy;  Laterality: N/A;   CONDYLOMA EXCISION/FULGURATION  2007   CONDYLOMA EXCISION/FULGURATION N/A 07/01/2018   Procedure: CONDYLOMA REMOVAL;  Surgeon: Matilda Senior, MD;  Location: [facility];  Service: Urology;  Laterality: N/A;   ESOPHAGOGASTRODUODENOSCOPY (EGD) WITH PROPOFOL  N/A 03/21/2020   Procedure: ESOPHAGOGASTRODUODENOSCOPY (EGD) WITH PROPOFOL ;  Surgeon: Teressa Toribio SQUIBB, MD;  Location: [facility];  Service: Endoscopy;  Laterality: N/A;   HEMOSTASIS CLIP PLACEMENT  03/21/2020   Procedure: HEMOSTASIS CLIP PLACEMENT;  Surgeon: Teressa Toribio SQUIBB, MD;  [facility]: [facility];  Service: Endoscopy;;   POLYPECTOMY  03/21/2020   Procedure: POLYPECTOMY;  Surgeon: Teressa Toribio SQUIBB, MD;  [facility]: [facility];  Service: Endoscopy;;    Social History:  reports that he has never smoked. He has never used smokeless tobacco. He reports that he does not currently use alcohol. He reports that he does not use drugs. Family History:  Family History  Problem Relation Age of Onset   Diabetes Mother    Hypertension Mother    Stroke Father    Colon polyps Neg Hx    Colon cancer Neg Hx    Esophageal cancer Neg Hx    Pancreatic  cancer Neg Hx    Liver disease Neg Hx    Stomach cancer Neg Hx      HOME MEDICATIONS: Allergies as of 01/28/2024   No Known Allergies      Medication List        Accurate as of January 28, 2024  7:00 AM. If you have any questions, ask your nurse or doctor.          ciclopirox  8 % solution Commonly known as: PENLAC  Apply topically at bedtime. Apply over nail and surrounding skin. Apply daily over previous coat. After seven (7) days, may remove with alcohol and continue cycle.   Contour Next EZ w/Device Kit Use as directed to  check blood glucose levels   Contour Next Test test strip Generic drug: glucose blood Check blood sugar twice a day as instructed by provider.   darunavir  800 MG tablet Commonly known as: PREZISTA  Take 1 tablet (800 mg total) by mouth daily with breakfast.   Genvoya  150-150-200-10 MG Tabs tablet Generic drug: elvitegravir-cobicistat -emtricitabine -tenofovir  Take 1 tablet by mouth daily with breakfast.   ketoconazole  2 % cream Commonly known as: NIZORAL  Apply topically 2 times daily.   ketoconazole  2 % cream Commonly known as: NIZORAL  Apply 1 Application topically daily.   methocarbamol  500 MG tablet Commonly known as: ROBAXIN  Take 1 tablet (500 mg total) by mouth 2 (two) times daily as needed for muscle spasms.   Microlet Lancets Misc Use as directed   propranolol  10 MG tablet Commonly known as: INDERAL  Take 1 tablet (10 mg total) by mouth 3 (three) times daily.   rosuvastatin  10 MG tablet Commonly known as: Crestor  Take 1 tablet (10 mg total) by mouth daily.   traMADol 50 MG tablet Commonly known as: ULTRAM Take 50 mg by mouth every 6 (six) hours as needed.         ALLERGIES: No Known Allergies   REVIEW OF SYSTEMS: A comprehensive ROS was conducted with the patient and is negative except as per HPI     OBJECTIVE:   VITAL SIGNS: There were no vitals taken for this visit.   PHYSICAL EXAM:  General: Pt appears well and is in NAD  Neck: General: Supple without adenopathy or carotid bruits. Thyroid : Thyroid  size normal.  No goiter or nodules appreciated.   Lungs: Clear with good BS bilat with no rales, rhonchi, or wheezes  Heart: RRR   Abdomen:  soft, nontender  Extremities:  Lower extremities - No pretibial edema. No lesions.  Neuro: MS is good with appropriate affect, pt is alert and Ox3    DM foot exam: 01/08/2023  The skin of the feet is intact sores or ulcerations but has bilateral callus formation and onychomycosis The pedal pulses are 2+ on  right and 2+ on left. The sensation is decreased to a screening 5.07, 10 gram monofilament bilaterally   DATA REVIEWED:  Lab Results  Component Value Date   HGBA1C 5.9 (A) 07/19/2023   HGBA1C 5.5 01/08/2023   HGBA1C 6.8 (A) 07/20/2022    Latest Reference Range & Units 10/12/23 08:52  Sodium 135 - 146 mmol/L 139  Potassium 3.5 - 5.3 mmol/L 3.9  Chloride 98 - 110 mmol/L 104  CO2 20 - 32 mmol/L 26  Glucose 65 - 99 mg/dL 860 (H)  BUN 7 - 25 mg/dL 10  Creatinine 9.29 - 8.69 mg/dL 9.25  Calcium  8.6 - 10.3 mg/dL 8.8  BUN/Creatinine Ratio 6 - 22 (calc) SEE NOTE:  AG Ratio 1.0 - 2.5 (calc)  1.1  AST 10 - 35 U/L 142 (H)  ALT 9 - 46 U/L 71 (H)  Total Protein 6.1 - 8.1 g/dL 7.4  Total Bilirubin 0.2 - 1.2 mg/dL 1.4 (H)  Total CHOL/HDL Ratio <5.0 (calc) 3.5  Cholesterol <200 mg/dL 797 (H)  HDL Cholesterol > OR = 40 mg/dL 57  LDL Cholesterol (Calc) mg/dL (calc) 875 (H)  Non-HDL Cholesterol (Calc) <130 mg/dL (calc) 854 (H)  Triglycerides <150 mg/dL 896  (H): Data is abnormally high  Latest Reference Range & Units 07/19/23 08:33  Microalb, Ur mg/dL 0.6  MICROALB/CREAT RATIO <30 mg/g creat 5  Creatinine, Urine 20 - 320 mg/dL 879      ASSESSMENT / PLAN / RECOMMENDATIONS:   1) Type 2 Diabetes Mellitus, Optimally controlled, With Neuropathic  complications - Most recent A1c of 5.9 %. Goal A1c < 7.0 %.     -A1c remains optimal - He has been off metformin  since December, 2024 - No changes    MEDICATIONS: N/a  EDUCATION / INSTRUCTIONS: BG monitoring instructions: Patient is instructed to check his blood sugars 2 times a day. Call [facility] if: BG persistently < 70  I reviewed the Rule of 15 for the treatment of hypoglycemia in detail with the patient. Literature supplied.   2) Diabetic complications:  Eye: Does not have known diabetic retinopathy.  Neuro/ Feet: Does  have known diabetic peripheral neuropathy based on exam 01/08/2022 Renal: Patient does not  have known baseline CKD. He is not on an ACEI/ARB at present.  3) Dyslipidemia :  - He  was started on rosuvastatin  by PCP in ~07/2021 - I had increased rosuvastatin  in June, 2025 due to elevated LDL  Medication rosuvastatin  10 mg daily      F/U in 4 months     Signed electronically by: Stefano Redgie Butts, MD  [facility]  [facility] [address] [place] [place], [place] [address] Phone: [phone] FAX: [phone]   CC: Lendia Boby CROME, NP-C [address] [place] [address] Phone: [phone]  Fax: [phone]    Return to Endocrinology clinic as below: Future Appointments  Date Time Provider Department Center  01/28/2024  8:30 AM Jenette Rayson, Donell Redgie, MD [facility] None  06/29/2024  9:00 AM [facility]  07/13/2024  8:30 AM Philemon Cordella BIRCH, FNP [facility]     "

## 2024-06-29 ENCOUNTER — Other Ambulatory Visit

## 2024-07-13 ENCOUNTER — Ambulatory Visit: Admitting: Family
